# Patient Record
Sex: Female | Born: 1980 | Race: Black or African American | Hispanic: No | Marital: Married | Smoking: Never smoker
Health system: Southern US, Community
[De-identification: ages and names within clinical notes are randomized; demographics above are authoritative.]

## PROBLEM LIST (undated history)

## (undated) DIAGNOSIS — E559 Vitamin D deficiency, unspecified: Secondary | ICD-10-CM

## (undated) DIAGNOSIS — L309 Dermatitis, unspecified: Secondary | ICD-10-CM

## (undated) DIAGNOSIS — R079 Chest pain, unspecified: Secondary | ICD-10-CM

## (undated) DIAGNOSIS — M21611 Bunion of right foot: Secondary | ICD-10-CM

## (undated) DIAGNOSIS — D649 Anemia, unspecified: Secondary | ICD-10-CM

## (undated) DIAGNOSIS — R600 Localized edema: Secondary | ICD-10-CM

## (undated) DIAGNOSIS — I82409 Acute embolism and thrombosis of unspecified deep veins of unspecified lower extremity: Secondary | ICD-10-CM

## (undated) DIAGNOSIS — M549 Dorsalgia, unspecified: Secondary | ICD-10-CM

## (undated) DIAGNOSIS — K5900 Constipation, unspecified: Secondary | ICD-10-CM

## (undated) DIAGNOSIS — M21612 Bunion of left foot: Secondary | ICD-10-CM

## (undated) DIAGNOSIS — R0600 Dyspnea, unspecified: Secondary | ICD-10-CM

## (undated) DIAGNOSIS — K219 Gastro-esophageal reflux disease without esophagitis: Secondary | ICD-10-CM

## (undated) DIAGNOSIS — F419 Anxiety disorder, unspecified: Secondary | ICD-10-CM

## (undated) DIAGNOSIS — E739 Lactose intolerance, unspecified: Secondary | ICD-10-CM

## (undated) DIAGNOSIS — O24419 Gestational diabetes mellitus in pregnancy, unspecified control: Secondary | ICD-10-CM

## (undated) DIAGNOSIS — I10 Essential (primary) hypertension: Secondary | ICD-10-CM

## (undated) HISTORY — DX: Bunion of left foot: M21.612

## (undated) HISTORY — DX: Dorsalgia, unspecified: M54.9

## (undated) HISTORY — DX: Dermatitis, unspecified: L30.9

## (undated) HISTORY — DX: Localized edema: R60.0

## (undated) HISTORY — DX: Gastro-esophageal reflux disease without esophagitis: K21.9

## (undated) HISTORY — DX: Chest pain, unspecified: R07.9

## (undated) HISTORY — DX: Acute embolism and thrombosis of unspecified deep veins of unspecified lower extremity: I82.409

## (undated) HISTORY — DX: Constipation, unspecified: K59.00

## (undated) HISTORY — DX: Vitamin D deficiency, unspecified: E55.9

## (undated) HISTORY — DX: Lactose intolerance, unspecified: E73.9

## (undated) HISTORY — DX: Dyspnea, unspecified: R06.00

## (undated) HISTORY — DX: Bunion of right foot: M21.611

## (undated) HISTORY — DX: Gestational diabetes mellitus in pregnancy, unspecified control: O24.419

## (undated) HISTORY — PX: TONSILLECTOMY: SUR1361

## (undated) HISTORY — DX: Anemia, unspecified: D64.9

## (undated) HISTORY — DX: Essential (primary) hypertension: I10

## (undated) HISTORY — DX: Anxiety disorder, unspecified: F41.9

## (undated) HISTORY — PX: TOTAL ABDOMINAL HYSTERECTOMY: SHX209

## (undated) HISTORY — PX: ROUX-EN-Y PROCEDURE: SUR1287

---

## 2016-03-21 ENCOUNTER — Other Ambulatory Visit: Payer: Self-pay | Admitting: Obstetrics and Gynecology

## 2016-03-21 ENCOUNTER — Other Ambulatory Visit (HOSPITAL_COMMUNITY)
Admission: RE | Admit: 2016-03-21 | Discharge: 2016-03-21 | Disposition: A | Discharge status: Home / Self Care | Payer: 59 | Source: Ambulatory Visit | Attending: Obstetrics and Gynecology | Admitting: Obstetrics and Gynecology

## 2016-03-21 DIAGNOSIS — Z01411 Encounter for gynecological examination (general) (routine) with abnormal findings: Secondary | ICD-10-CM | POA: Diagnosis present

## 2016-03-21 DIAGNOSIS — Z1151 Encounter for screening for human papillomavirus (HPV): Secondary | ICD-10-CM | POA: Diagnosis not present

## 2016-03-27 LAB — CYTOLOGY - PAP: HPV: NOT DETECTED

## 2016-05-02 ENCOUNTER — Other Ambulatory Visit: Payer: Self-pay | Admitting: Obstetrics and Gynecology

## 2017-04-15 ENCOUNTER — Other Ambulatory Visit (HOSPITAL_COMMUNITY)
Admission: RE | Admit: 2017-04-15 | Discharge: 2017-04-15 | Disposition: A | Discharge status: Home / Self Care | Payer: 59 | Source: Ambulatory Visit | Attending: Obstetrics and Gynecology | Admitting: Obstetrics and Gynecology

## 2017-04-15 ENCOUNTER — Other Ambulatory Visit: Payer: Self-pay | Admitting: Obstetrics and Gynecology

## 2017-04-15 DIAGNOSIS — Z01411 Encounter for gynecological examination (general) (routine) with abnormal findings: Secondary | ICD-10-CM | POA: Insufficient documentation

## 2017-04-18 LAB — CYTOLOGY - PAP
Diagnosis: NEGATIVE
HPV: NOT DETECTED

## 2017-07-02 ENCOUNTER — Other Ambulatory Visit: Payer: Self-pay

## 2017-07-02 DIAGNOSIS — M7989 Other specified soft tissue disorders: Secondary | ICD-10-CM

## 2017-08-07 ENCOUNTER — Encounter (HOSPITAL_COMMUNITY): Payer: 59

## 2017-08-07 ENCOUNTER — Encounter

## 2017-08-07 ENCOUNTER — Encounter: Payer: 59 | Admitting: Vascular Surgery

## 2017-08-19 ENCOUNTER — Other Ambulatory Visit: Payer: Self-pay | Admitting: Obstetrics and Gynecology

## 2017-08-19 DIAGNOSIS — N6320 Unspecified lump in the left breast, unspecified quadrant: Secondary | ICD-10-CM

## 2017-08-19 DIAGNOSIS — N644 Mastodynia: Secondary | ICD-10-CM

## 2017-09-02 ENCOUNTER — Other Ambulatory Visit: Payer: Self-pay | Admitting: Obstetrics and Gynecology

## 2017-09-02 DIAGNOSIS — N644 Mastodynia: Secondary | ICD-10-CM

## 2017-09-02 DIAGNOSIS — N6320 Unspecified lump in the left breast, unspecified quadrant: Secondary | ICD-10-CM

## 2017-09-05 ENCOUNTER — Ambulatory Visit
Admission: RE | Admit: 2017-09-05 | Discharge: 2017-09-05 | Disposition: A | Discharge status: Home / Self Care | Payer: 59 | Source: Ambulatory Visit | Attending: Obstetrics and Gynecology | Admitting: Obstetrics and Gynecology

## 2017-09-05 ENCOUNTER — Ambulatory Visit: Payer: 59

## 2017-09-05 DIAGNOSIS — N6320 Unspecified lump in the left breast, unspecified quadrant: Secondary | ICD-10-CM

## 2017-09-05 DIAGNOSIS — N644 Mastodynia: Secondary | ICD-10-CM

## 2017-09-12 ENCOUNTER — Ambulatory Visit (INDEPENDENT_AMBULATORY_CARE_PROVIDER_SITE_OTHER): Payer: 59 | Admitting: Podiatry

## 2017-09-12 ENCOUNTER — Encounter: Payer: Self-pay | Admitting: Podiatry

## 2017-09-12 VITALS — BP 128/84 | HR 86

## 2017-09-12 DIAGNOSIS — L603 Nail dystrophy: Secondary | ICD-10-CM | POA: Diagnosis not present

## 2017-09-12 DIAGNOSIS — L600 Ingrowing nail: Secondary | ICD-10-CM | POA: Diagnosis not present

## 2017-09-12 NOTE — Patient Instructions (Signed)
Soak Instructions    THE DAY AFTER THE PROCEDURE  Place 1/4 cup of epsom salts in a quart of warm tap water.  Submerge your foot or feet with outer bandage intact for the initial soak; this will allow the bandage to become moist and wet for easy lift off.  Once you remove your bandage, continue to soak in the solution for 20 minutes.  This soak should be done twice a day.  Next, remove your foot or feet from solution, blot dry the affected area and cover.  You may use a band aid large enough to cover the area or use gauze and tape.  Apply other medications to the area as directed by the doctor such as polysporin neosporin.  IF YOUR SKIN BECOMES IRRITATED WHILE USING THESE INSTRUCTIONS, IT IS OKAY TO SWITCH TO  WHITE VINEGAR AND WATER. Or you may use antibacterial soap and water to keep the toe clean  Monitor for any signs/symptoms of infection. Call the office immediately if any occur or go directly to the emergency room. Call with any questions/concerns.  Onychomycosis/Fungal Toenails  WHAT IS IT? An infection that lies within the keratin of your nail plate that is caused by a fungus.  WHY ME? Fungal infections affect all ages, sexes, races, and creeds.  There may be many factors that predispose you to a fungal infection such as age, coexisting medical conditions such as diabetes, or an autoimmune disease; stress, medications, fatigue, genetics, etc.  Bottom line: fungus thrives in a warm, moist environment and your shoes offer such a location.  IS IT CONTAGIOUS? Theoretically, yes.  You do not want to share shoes, nail clippers or files with someone who has fungal toenails.  Walking around barefoot in the same room or sleeping in the same bed is unlikely to transfer the organism.  It is important to realize, however, that fungus can spread easily from one nail to the next on the same foot.  HOW DO WE TREAT THIS?  There are several ways to treat this condition.  Treatment may depend on many  factors such as age, medications, pregnancy, liver and kidney conditions, etc.  It is best to ask your doctor which options are available to you.  1. No treatment.   Unlike many other medical concerns, you can live with this condition.  However for many people this can be a painful condition and may lead to ingrown toenails or a bacterial infection.  It is recommended that you keep the nails cut short to help reduce the amount of fungal nail. 2. Topical treatment.  These range from herbal remedies to prescription strength nail lacquers.  About 40-50% effective, topicals require twice daily application for approximately 9 to 12 months or until an entirely new nail has grown out.  The most effective topicals are medical grade medications available through physicians offices. 3. Oral antifungal medications.  With an 80-90% cure rate, the most common oral medication requires 3 to 4 months of therapy and stays in your system for a year as the new nail grows out.  Oral antifungal medications do require blood work to make sure it is a safe drug for you.  A liver function panel will be performed prior to starting the medication and after the first month of treatment.  It is important to have the blood work performed to avoid any harmful side effects.  In general, this medication safe but blood work is required. 4. Laser Therapy.  This treatment is performed by  applying a specialized laser to the affected nail plate.  This therapy is noninvasive, fast, and non-painful.  It is not covered by insurance and is therefore, out of pocket.  The results have been very good with a 80-95% cure rate.  [facility] is the only practice in the area to offer this therapy. 5. Permanent Nail Avulsion.  Removing the entire nail so that a new nail will not grow back.  Ingrown Toenail An ingrown toenail occurs when the corner or sides of your toenail grow into the surrounding skin. The big toe is most commonly affected, but  it can happen to any of your toes. If your ingrown toenail is not treated, you will be at risk for infection. What are the causes? This condition may be caused by:  Wearing shoes that are too small or tight.  Injury or trauma, such as stubbing your toe or having your toe stepped on.  Improper cutting or care of your toenails.  Being born with (congenital) nail or foot abnormalities, such as having a nail that is too big for your toe.  What increases the risk? Risk factors for an ingrown toenail include:  Age. Your nails tend to thicken as you get older, so ingrown nails are more common in older people.  Diabetes.  Cutting your toenails incorrectly.  Blood circulation problems.  What are the signs or symptoms? Symptoms may include:  Pain, soreness, or tenderness.  Redness.  Swelling.  Hardening of the skin surrounding the toe.  Your ingrown toenail may be infected if there is fluid, pus, or drainage. How is this diagnosed? An ingrown toenail may be diagnosed by medical history and physical exam. If your toenail is infected, your health care provider may test a sample of the drainage. How is this treated? Treatment depends on the severity of your ingrown toenail. Some ingrown toenails may be treated at home. More severe or infected ingrown toenails may require surgery to remove all or part of the nail. Infected ingrown toenails may also be treated with antibiotic medicines. Follow these instructions at home:  If you were prescribed an antibiotic medicine, finish all of it even if you start to feel better.  Soak your foot in warm soapy water for 20 minutes, 3 times per day or as directed by your health care provider.  Carefully lift the edge of the nail away from the sore skin by wedging a small piece of cotton under the corner of the nail. This may help with the pain. Be careful not to cause more injury to the area.  Wear shoes that fit well. If your ingrown toenail is  causing you pain, try wearing sandals, if possible.  Trim your toenails regularly and carefully. Do not cut them in a curved shape. Cut your toenails straight across. This prevents injury to the skin at the corners of the toenail.  Keep your feet clean and dry.  If you are having trouble walking and are given crutches by your health care provider, use them as directed.  Do not pick at your toenail or try to remove it yourself.  Take medicines only as directed by your health care provider.  Keep all follow-up visits as directed by your health care provider. This is important. Contact a health care provider if:  Your symptoms do not improve with treatment. Get help right away if:  You have red streaks that start at your foot and go up your leg.  You have a fever.  You have increased redness, swelling, or pain.  You have fluid, blood, or pus coming from your toenail. This information is not intended to replace advice given to you by your health care provider. Make sure you discuss any questions you have with your health care provider. Document Released: 12/30/1999 Document Revised: 06/03/2015 Document Reviewed: 11/25/2013 Elsevier Interactive Patient Education  [employer].

## 2017-09-12 NOTE — Progress Notes (Signed)
Subjective:   Patient ID: Allison Valenzuela, female   DOB: 37 y.o.   MRN: [id number]   HPI 37 year old female presents the office today for concerns of a possible ingrown toenail to the right big toe, pointing the lateral aspect of the tip of the toe.  She states that she had toenail fungus and another doctor took the nail off and she states that it grew back in about the same.  She also completed 3 months of Lamisil without any significant improvement.  She is not sure if the nails grow back normal or what needs to be done to help the nail grow out better.  She is also been getting up with her Nash Mantis on the area which is been helpful as well as soaking Epsom salts.  She has some occasional swelling to the toenail site.  No other concerns.   Review of Systems  All other systems reviewed and are negative.  No past medical history on file.  Past Surgical History:  Procedure Laterality Date  . ROUX-EN-Y PROCEDURE       Current Outpatient Medications:  .  Cholecalciferol (VITAMIN D) 400 UNIT/ML LIQD, Take by mouth., Disp: , Rfl:  .  Cyanocobalamin (VITAMIN B 12) 100 MCG LOZG, Take by mouth., Disp: , Rfl:  .  ELIQUIS 5 MG TABS tablet, Take 5 mg by mouth 2 (two) times daily., Disp: , Rfl: 2 .  IRON PO, Take 65 mg by mouth daily., Disp: , Rfl:  .  NON FORMULARY, [facility]  Onychomycosis Nail Lacquer -  Fluconazole 2%, Terbinafine 1% DMSO/UNDECYLENIC ACID 25% Apply to affected nail once daily Qty. 120 gm 3 refills, Disp: , Rfl:   Allergies  Allergen Reactions  . Dye Fdc Blue 1 [Brilliant Blue Fcf]   . Nickel         Objective:  Physical Exam  General: AAO x3, NAD  Dermatological: The right hallux toenails hypertrophic, dystrophic ill-defined discoloration.  There is incurvation to both corners there is tenderness to the right lateral distal nail corner.  No drainage or pus expressed.  Minimal swelling to the area and there is no erythema or increase in warmth.  No fluctuation or  crepitation or any signs of infection noted today.  Vascular: Dorsalis Pedis artery and Posterior Tibial artery pedal pulses are 2/4 bilateral with immedate capillary fill time. There is no pain with calf compression, swelling, warmth, erythema.   Neruologic: Grossly intact via light touch bilateral. Protective threshold with Semmes Wienstein monofilament intact to all pedal sites bilateral.   Musculoskeletal: No gross boney pedal deformities bilateral. No pain, crepitus, or limitation noted with foot and ankle range of motion bilateral. Muscular strength 5/5 in all groups tested bilateral.  Gait: Unassisted, Nonantalgic.      Assessment:   Ingrown toenail, onychomycosis right hallux     Plan:  -Treatment options discussed including all alternatives, risks, and complications -Etiology of symptoms were discussed -This time we discussed partial nail avulsion we discussed the procedure and postoperative course.  She is hesitant to do this today.  I did sharply debride the toenail with any complications of bleeding to remove the symptomatic portion of ingrown toenail.  After debridement there is no additional pain.Epsom salts soaks as well as whenever soaks to be discussed.  Also discussed topical treatment for nail fungus.  I ordered a compound ointment through [employer].  -Monitor for any signs or symptoms symptoms of infection or if symptoms worsen.  Monitoring signs or symptoms of infection.  Trula Slade DPM

## 2017-09-26 ENCOUNTER — Encounter (INDEPENDENT_AMBULATORY_CARE_PROVIDER_SITE_OTHER): Payer: 59

## 2017-10-08 ENCOUNTER — Encounter (INDEPENDENT_AMBULATORY_CARE_PROVIDER_SITE_OTHER): Payer: Self-pay | Admitting: Family Medicine

## 2017-10-08 ENCOUNTER — Ambulatory Visit (INDEPENDENT_AMBULATORY_CARE_PROVIDER_SITE_OTHER): Payer: 59 | Admitting: Family Medicine

## 2017-10-08 VITALS — BP 116/80 | HR 63 | Temp 97.8°F | Ht 66.0 in | Wt 234.0 lb

## 2017-10-08 DIAGNOSIS — Z6837 Body mass index (BMI) 37.0-37.9, adult: Secondary | ICD-10-CM | POA: Diagnosis not present

## 2017-10-08 DIAGNOSIS — E538 Deficiency of other specified B group vitamins: Secondary | ICD-10-CM

## 2017-10-08 DIAGNOSIS — Z9189 Other specified personal risk factors, not elsewhere classified: Secondary | ICD-10-CM

## 2017-10-08 DIAGNOSIS — Z0289 Encounter for other administrative examinations: Secondary | ICD-10-CM

## 2017-10-08 DIAGNOSIS — Z1331 Encounter for screening for depression: Secondary | ICD-10-CM

## 2017-10-08 DIAGNOSIS — R5383 Other fatigue: Secondary | ICD-10-CM | POA: Diagnosis not present

## 2017-10-08 DIAGNOSIS — R0609 Other forms of dyspnea: Secondary | ICD-10-CM

## 2017-10-08 DIAGNOSIS — R0600 Dyspnea, unspecified: Secondary | ICD-10-CM

## 2017-10-08 DIAGNOSIS — E162 Hypoglycemia, unspecified: Secondary | ICD-10-CM | POA: Diagnosis not present

## 2017-10-08 DIAGNOSIS — E559 Vitamin D deficiency, unspecified: Secondary | ICD-10-CM

## 2017-10-09 ENCOUNTER — Ambulatory Visit (INDEPENDENT_AMBULATORY_CARE_PROVIDER_SITE_OTHER): Payer: 59 | Admitting: Family Medicine

## 2017-10-09 LAB — COMPREHENSIVE METABOLIC PANEL
ALT: 10 IU/L (ref 0–32)
AST: 20 IU/L (ref 0–40)
Albumin/Globulin Ratio: 1.6 (ref 1.2–2.2)
Albumin: 4.6 g/dL (ref 3.5–5.5)
Alkaline Phosphatase: 60 IU/L (ref 39–117)
BUN/Creatinine Ratio: 15 (ref 9–23)
BUN: 13 mg/dL (ref 6–20)
Bilirubin Total: 0.7 mg/dL (ref 0.0–1.2)
CO2: 22 mmol/L (ref 20–29)
Calcium: 9.1 mg/dL (ref 8.7–10.2)
Chloride: 100 mmol/L (ref 96–106)
Creatinine, Ser: 0.84 mg/dL (ref 0.57–1.00)
GFR calc Af Amer: 101 mL/min/{1.73_m2} (ref >59)
GFR calc non Af Amer: 88 mL/min/{1.73_m2} (ref >59)
Globulin, Total: 2.9 g/dL (ref 1.5–4.5)
Glucose: 82 mg/dL (ref 65–99)
Potassium: 4.1 mmol/L (ref 3.5–5.2)
Sodium: 137 mmol/L (ref 134–144)
Total Protein: 7.5 g/dL (ref 6.0–8.5)

## 2017-10-09 LAB — TSH: TSH: 0.789 u[IU]/mL (ref 0.450–4.500)

## 2017-10-09 LAB — CBC WITH DIFFERENTIAL/PLATELET
Basophils Absolute: 0 10*3/uL (ref 0.0–0.2)
Basos: 1 %
EOS (ABSOLUTE): 0.1 10*3/uL (ref 0.0–0.4)
Eos: 3 %
Hematocrit: 36.4 % (ref 34.0–46.6)
Hemoglobin: 11.7 g/dL (ref 11.1–15.9)
Immature Grans (Abs): 0 10*3/uL (ref 0.0–0.1)
Immature Granulocytes: 0 %
Lymphocytes Absolute: 1.6 10*3/uL (ref 0.7–3.1)
Lymphs: 55 %
MCH: 30.1 pg (ref 26.6–33.0)
MCHC: 32.1 g/dL (ref 31.5–35.7)
MCV: 94 fL (ref 79–97)
Monocytes Absolute: 0.2 10*3/uL (ref 0.1–0.9)
Monocytes: 7 %
Neutrophils Absolute: 1 10*3/uL — ABNORMAL LOW (ref 1.4–7.0)
Neutrophils: 34 %
Platelets: 270 10*3/uL (ref 150–450)
RBC: 3.89 x10E6/uL (ref 3.77–5.28)
RDW: 13.1 % (ref 12.3–15.4)
WBC: 3 10*3/uL — ABNORMAL LOW (ref 3.4–10.8)

## 2017-10-09 LAB — LIPID PANEL
Chol/HDL Ratio: 2.5 ratio (ref 0.0–4.4)
Cholesterol, Total: 183 mg/dL (ref 100–199)
HDL: 72 mg/dL (ref >39)
LDL Calculated: 99 mg/dL (ref 0–99)
Triglycerides: 62 mg/dL (ref 0–149)
VLDL Cholesterol Cal: 12 mg/dL (ref 5–40)

## 2017-10-09 LAB — HEMOGLOBIN A1C
Est. average glucose Bld gHb Est-mCnc: 100 mg/dL
Hgb A1c MFr Bld: 5.1 % (ref 4.8–5.6)

## 2017-10-09 LAB — FOLATE: Folate: 14.4 ng/mL (ref >3.0)

## 2017-10-09 LAB — T3: T3, Total: 93 ng/dL (ref 71–180)

## 2017-10-09 LAB — T4, FREE: Free T4: 1.1 ng/dL (ref 0.82–1.77)

## 2017-10-09 LAB — INSULIN, RANDOM: INSULIN: 4.3 u[IU]/mL (ref 2.6–24.9)

## 2017-10-09 LAB — VITAMIN B12: Vitamin B-12: 419 pg/mL (ref 232–1245)

## 2017-10-09 LAB — VITAMIN D 25 HYDROXY (VIT D DEFICIENCY, FRACTURES): Vit D, 25-Hydroxy: 27.6 ng/mL — ABNORMAL LOW (ref 30.0–100.0)

## 2017-10-09 NOTE — Progress Notes (Signed)
Office: [phone]  /  Fax: [phone]   Dear Marda Stalker, PA-C,   Thank you for referring Allison Valenzuela to our clinic. The following note includes my evaluation and treatment recommendations.  HPI:   Chief Complaint: OBESITY    Allison Valenzuela has been referred by Marda Stalker, PA-C for consultation regarding her obesity and obesity related comorbidities.    Allison Valenzuela (MR# [id number]) is a 37 y.o. female who presents on 10/08/2017 for obesity evaluation and treatment. Current BMI is Body mass index is 37.77 kg/m.Marland Kitchen Allison Valenzuela has been struggling with her weight for many years and has been unsuccessful in either losing weight, maintaining weight loss, or reaching her healthy weight goal.     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 She has gone from 300 lbs to 165 lbs in 12 months, she started regaining after 4 years after the birth of her last child.     Allison Valenzuela attended our information session and states she is currently in the action stage of change and ready to dedicate time achieving and maintaining a healthier weight. Allison Valenzuela is interested in becoming our patient and working on intensive lifestyle modifications including (but not limited to) diet, exercise and weight loss.    Allison Valenzuela states her family eats meals together she thinks her family will eat healthier with  her her desired weight loss is 69 lbs she has been heavy most of  her life her heaviest weight ever was 300 lbs she is a picky eater and doesn't like to eat healthier foods  she has significant food cravings issues  she snacks frequently in the evenings she skips meals frequently she is frequently drinking liquids with calories she frequently makes poor food choices she has problems with excessive hunger  she struggles with emotional eating    Fatigue Allison Valenzuela feels her energy is lower than it should be. This has worsened with weight gain and has not worsened recently. Shereece admits to daytime  somnolence and  admits to waking up still tired. Patient is at risk for obstructive sleep apnea. Patent has a history of symptoms of daytime fatigue. Patient generally gets 5 hours of sleep per night, and states they generally have difficulty falling asleep. Snoring is not present. Apneic episodes are not present. Epworth Sleepiness Score is 1.  Dyspnea on exertion [name] notes increasing shortness of breath with exercising and seems to be worsening over time with weight gain. She notes getting out of breath sooner with activity than she used to. This has not gotten worse recently. [name] denies orthopnea.  Hyperglycemia Allison Valenzuela has a history of some elevated blood glucose readings with a history of gestational diabetes mellitus. She has no recent labs. She admits to polyphagia.  Vitamin D Deficiency Allison Valenzuela has a diagnosis of vitamin D deficiency. She is status post weight loss surgery. She is on OTC Vit D, and no recent labs. She notes fatigue and denies nausea, vomiting or muscle weakness.  Vitamin B12 Deficiency Allison Valenzuela has a diagnosis of B12 insufficiency and notes fatigue. She is status post weight loss surgery. She is on OTC Vit B12, and no recent labs. She is not a vegetarian and does not have a previous diagnosis of pernicious anemia.   Positive Depression Screen Allison Valenzuela had a positive depression screening with a PHQ-9 of 19. She notes significant emotional eating and low self-worth. She notes feelings of failure with weight gain after weight loss surgery. She denies suicidal ideations or homicidal ideations.   Depression Screen  Allison Valenzuela's Food and Mood (modified PHQ-9) score was  Depression screen PHQ 2/9 10/08/2017  Decreased Interest 3  Down, Depressed, Hopeless 1  PHQ - 2 Score 4  Altered sleeping 3  Tired, decreased energy 3  Change in appetite 3  Feeling bad or failure about yourself  3  Trouble concentrating 3  Moving slowly or fidgety/restless 0  Suicidal thoughts 0  PHQ-9 Score 19    Difficult doing work/chores Somewhat difficult    ALLERGIES: Allergies  Allergen Reactions  . Dye Fdc Blue 1 [Brilliant Blue Fcf]   . Nickel     MEDICATIONS: Current Outpatient Medications on File Prior to Visit  Medication Sig Dispense Refill  . Ascorbic Acid (VITAMIN C) 1000 MG tablet Take 1,000 mg by mouth daily.    . Cholecalciferol (VITAMIN D) 400 UNIT/ML LIQD Take by mouth.    . Cyanocobalamin (VITAMIN B 12) 100 MCG LOZG Take by mouth.    [name] 5 MG TABS tablet Take 5 mg by mouth 2 (two) times daily.  2  . fexofenadine (ALLEGRA) 180 MG tablet Take 180 mg by mouth daily.    . IRON PO Take 65 mg by mouth daily.    . Probiotic Product (PROBIOTIC-10 PO) Take 1 capsule by mouth daily.    . pseudoephedrine (SUDAFED) 120 MG 12 hr tablet Take 120 mg by mouth daily.    [name] [facility]  Onychomycosis Nail Lacquer -  Fluconazole 2%, Terbinafine 1% DMSO/UNDECYLENIC ACID 25% Apply to affected nail once daily Qty. 120 gm 3 refills     No current facility-administered medications on file prior to visit.     PAST MEDICAL HISTORY: Past Medical History:  Diagnosis Date  . Acid reflux   . Anemia   . Back pain   . Bilateral bunions   . Chest pain   . Constipation   . DVT (deep venous thrombosis) (HCC)    right leg  . Dyspnea   . Gestational diabetes   . HTN (hypertension)   . Lactose intolerance   . Leg edema   . Vitamin D deficiency     PAST SURGICAL HISTORY: Past Surgical History:  Procedure Laterality Date  . ROUX-EN-Y PROCEDURE      SOCIAL HISTORY: Social History   Tobacco Use  . Smoking status: Never Smoker  . Smokeless tobacco: Never Used  Substance Use Topics  . Alcohol use: Yes    Comment: 1-2 a week  . Drug use: Never    FAMILY HISTORY: Family History  Problem Relation Age of Onset  . Hypertension Mother   . Heart disease Mother   . Thyroid disease Mother   . Obesity Mother     ROS: Review of Systems  Constitutional:  Positive for malaise/fatigue. Negative for weight loss.       + Breast lumps + Trouble sleeping   HENT: Positive for sinus pain and tinnitus.        + Nasal stuffiness   Eyes: Positive for blurred vision, double vision and photophobia.       + Vision changes + Wear glasses or contacts  Respiratory: Positive for shortness of breath (with exertion).   Cardiovascular: Positive for palpitations. Negative for orthopnea.       + Chest pain/discomfort + Chest tightness + Difficulty breathing while lying down + Calf/leg pain with walking + Leg cramping + Very cold feet or hands  Gastrointestinal: Positive for constipation, heartburn and nausea.  Musculoskeletal: Positive for back pain.       +  Neck stiffness  Skin: Positive for itching and rash.       + Dryness + Hair or nail changes  Neurological: Positive for headaches.  Endo/Heme/Allergies: Bruises/bleeds easily.       Negative hypoglycemia Positive polyphagia + Heat/cold intolerance  Psychiatric/Behavioral: Positive for depression. Negative for suicidal ideas.    PHYSICAL EXAM: Blood pressure 116/80, pulse 63, temperature 97.8 F (36.6 C), temperature source Oral, height 5\' 6"  (1.676 m), weight 234 lb (106.1 kg), last menstrual period 10/03/2017, SpO2 100 %. Body mass index is 37.77 kg/m. Physical Exam  Constitutional: She is oriented to person, place, and time. She appears well-developed and well-nourished.  HENT:  Head: Normocephalic and atraumatic.  Nose: Nose normal.  Eyes: EOM are normal. No scleral icterus.  Neck: Normal range of motion. Neck supple. No thyromegaly present.  Cardiovascular: Normal rate and regular rhythm.  Pulmonary/Chest: Effort normal. No respiratory distress.  Abdominal: Soft. There is no tenderness.  + Obesity  Musculoskeletal:  Range of Motion normal in all 4 extremities Trace edema noted in bilateral lower extremities  Neurological: She is alert and oriented to person, place, and time.  Coordination normal.  Skin: Skin is warm and dry.  Psychiatric: She has a normal mood and affect. Her behavior is normal.  Vitals reviewed.   RECENT LABS AND TESTS: BMET    Component Value Date/Time   NA 137 10/08/2017 0941   K 4.1 10/08/2017 0941   CL 100 10/08/2017 0941   CO2 22 10/08/2017 0941   GLUCOSE 82 10/08/2017 0941   BUN 13 10/08/2017 0941   CREATININE 0.84 10/08/2017 0941   CALCIUM 9.1 10/08/2017 0941   GFRNONAA 88 10/08/2017 0941   GFRAA 101 10/08/2017 0941   Lab Results  Component Value Date   HGBA1C 5.1 10/08/2017   Lab Results  Component Value Date   INSULIN WILL FOLLOW 10/08/2017   CBC    Component Value Date/Time   WBC 3.0 (L) 10/08/2017 0941   RBC 3.89 10/08/2017 0941   HGB 11.7 10/08/2017 0941   HCT 36.4 10/08/2017 0941   PLT 270 10/08/2017 0941   MCV 94 10/08/2017 0941   MCH 30.1 10/08/2017 0941   MCHC 32.1 10/08/2017 0941   RDW 13.1 10/08/2017 0941   LYMPHSABS 1.6 10/08/2017 0941   EOSABS 0.1 10/08/2017 0941   BASOSABS 0.0 10/08/2017 0941   Iron/TIBC/Ferritin/ %Sat No results found for: IRON, TIBC, FERRITIN, IRONPCTSAT Lipid Panel     Component Value Date/Time   CHOL 183 10/08/2017 0941   TRIG 62 10/08/2017 0941   HDL 72 10/08/2017 0941   CHOLHDL 2.5 10/08/2017 0941   LDLCALC 99 10/08/2017 0941   Hepatic Function Panel     Component Value Date/Time   PROT 7.5 10/08/2017 0941   ALBUMIN 4.6 10/08/2017 0941   AST 20 10/08/2017 0941   ALT 10 10/08/2017 0941   ALKPHOS 60 10/08/2017 0941   BILITOT 0.7 10/08/2017 0941      Component Value Date/Time   TSH 0.789 10/08/2017 0941    ECG  shows NSR with a rate of 69 BPM INDIRECT CALORIMETER done today shows a VO2 of 194 and a REE of 1349.  Her calculated basal metabolic rate is 9326 thus her basal metabolic rate is worse than expected.    ASSESSMENT AND PLAN: Other fatigue - Plan: EKG 12-Lead, CBC with Differential/Platelet, Comprehensive metabolic panel, Lipid panel, Folate, T3,  T4, free, TSH  Dyspnea on exertion  Hypoglycemia - Plan: Hemoglobin A1c, Insulin, random  Vitamin  D deficiency - Plan: VITAMIN D 25 Hydroxy (Vit-D Deficiency, Fractures)  B12 deficiency - Plan: Vitamin B12  Positive depression screening  Screening for depression  At risk for heart disease  Class 2 severe obesity with serious comorbidity and body mass index (BMI) of 37.0 to 37.9 in adult, unspecified obesity type ([place])  PLAN:  Fatigue Joei was informed that her fatigue may be related to obesity, depression or many other causes. Labs will be ordered, and in the meanwhile Ciji has agreed to work on diet, exercise and weight loss to help with fatigue. Proper sleep hygiene was discussed including the need for 7-8 hours of quality sleep each night. A sleep study was not ordered based on symptoms and Epworth score.  Dyspnea on exertion Arlyss's shortness of breath appears to be obesity related and exercise induced. She has agreed to work on weight loss and gradually increase exercise to treat her exercise induced shortness of breath. If Rickey follows our instructions and loses weight without improvement of her shortness of breath, we will plan to refer to pulmonology. We will monitor this condition regularly. Chantell agrees to this plan.  Hyperglycemia Fasting labs will be obtained today and results with be discussed with [name] in 2 weeks at her follow up visit. In the meanwhile Kjirsten was started on a lower simple carbohydrate diet and will work on weight loss efforts.  Vitamin D Deficiency Analisia was informed that low vitamin D levels contributes to fatigue and are associated with obesity, breast, and colon cancer. Reilly agrees to continue taking OTC Vit D and will follow up for routine testing of vitamin D, at least 2-3 times per year. She was informed of the risk of over-replacement of vitamin D and agrees to not increase her dose unless she discusses this with [name] first. We will check labs  and Janautica agrees to follow up with our clinic in 2 weeks.  Vitamin B12 Deficiency Lynessa will continue taking OTC Vit B12 and will work on increasing B12 rich foods in her diet. B12 supplementation was not prescribed today. We will check labs and Manisha agrees to follow up with our clinic in 2 weeks.  Positive Depression Screen Elana had a positive depression screening. We will continue to monitor her mood, and may need to refer to Dr. Mallie Mussel, our bariatric psychologist for evaluation and possible therapy.   Depression Screen Adalind had a strongly positive depression screening. Depression is commonly associated with obesity and often results in emotional eating behaviors. We will monitor this closely and work on CBT to help improve the non-hunger eating patterns. Referral to Psychology may be required if no improvement is seen as she continues in our clinic.  Cardiovascular risk counselling Thedora was given extended (15 minutes) coronary artery disease prevention counseling today. She is 37 y.o. female and has risk factors for heart disease including obesity. We discussed intensive lifestyle modifications today with an emphasis on specific weight loss instructions and strategies. Pt was also informed of the importance of increasing exercise and decreasing saturated fats to help prevent heart disease.  Obesity Mckinzey is currently in the action stage of change and her goal is to continue with weight loss efforts. I recommend Avigail begin the structured treatment plan as follows:  She has agreed to follow the Category 2 plan Lashaya has been instructed to eventually work up to a goal of 150 minutes of combined cardio and strengthening exercise per week for weight loss and overall health benefits. We discussed the following  Behavioral Modification Strategies today: increasing lean protein intake, decreasing simple carbohydrates  and work on meal planning and easy cooking plans   She was informed of the  importance of frequent follow up visits to maximize her success with intensive lifestyle modifications for her multiple health conditions. She was informed we would discuss her lab results at her next visit unless there is a critical issue that needs to be addressed sooner. Ayasha agreed to keep her next visit at the agreed upon time to discuss these results.    OBESITY BEHAVIORAL INTERVENTION VISIT  Today's visit was # 1   Starting weight: 234 lbs Starting date: 10/08/17 Today's weight : 234 lbs Today's date: 10/08/2017 Total lbs lost to date: 0    ASK: We discussed the diagnosis of obesity with Allison Valenzuela today and Glender agreed to give [name] permission to discuss obesity behavioral modification therapy today.  ASSESS: Naesha has the diagnosis of obesity and her BMI today is 37.79 Meleane is in the action stage of change   ADVISE: Makensey was educated on the multiple health risks of obesity as well as the benefit of weight loss to improve her health. She was advised of the need for long term treatment and the importance of lifestyle modifications to improve her current health and to decrease her risk of future health problems.  AGREE: Multiple dietary modification options and treatment options were discussed and  Gloria agreed to follow the recommendations documented in the above note.  ARRANGE: Mathew was educated on the importance of frequent visits to treat obesity as outlined per CMS and USPSTF guidelines and agreed to schedule her next follow up appointment today.  I, Trixie Dredge, am acting as transcriptionist for Dennard Nip, MD  I have reviewed the above documentation for accuracy and completeness, and I agree with the above. -Dennard Nip, MD

## 2017-10-10 ENCOUNTER — Encounter (INDEPENDENT_AMBULATORY_CARE_PROVIDER_SITE_OTHER): Payer: Self-pay | Admitting: Family Medicine

## 2017-10-22 ENCOUNTER — Ambulatory Visit (INDEPENDENT_AMBULATORY_CARE_PROVIDER_SITE_OTHER): Payer: 59 | Admitting: Family Medicine

## 2017-10-22 VITALS — BP 118/78 | HR 81 | Temp 98.1°F | Ht 66.0 in | Wt 234.0 lb

## 2017-10-22 DIAGNOSIS — R632 Polyphagia: Secondary | ICD-10-CM

## 2017-10-22 DIAGNOSIS — Z6837 Body mass index (BMI) 37.0-37.9, adult: Secondary | ICD-10-CM

## 2017-10-22 DIAGNOSIS — E559 Vitamin D deficiency, unspecified: Secondary | ICD-10-CM | POA: Diagnosis not present

## 2017-10-22 DIAGNOSIS — Z9189 Other specified personal risk factors, not elsewhere classified: Secondary | ICD-10-CM | POA: Diagnosis not present

## 2017-10-22 MED ORDER — VITAMIN D (ERGOCALCIFEROL) 1.25 MG (50000 UNIT) PO CAPS: 50000.0000 [IU] | ORAL_CAPSULE | ORAL | 0 refills | Status: DC

## 2017-10-23 NOTE — Progress Notes (Signed)
Office: [phone]  /  Fax: [phone]   HPI:   Chief Complaint: OBESITY Allison Valenzuela is here to discuss her progress with her obesity treatment plan. She is on the Category 2 plan and is following her eating plan approximately 85 % of the time. She states she is walking 30 minutes 2 times per week. Allison Valenzuela struggled to lose weight in the last two weeks, due to excessive hunger. She states, she followed her plan closely and she worked to eat all of her food. Her weight is 234 lb (106.1 kg) today and she has maintained weight over a period of 2 weeks since her last visit. She has lost 0 lbs since starting treatment with [name].  Vitamin D deficiency Allison Valenzuela has a diagnosis of vitamin D deficiency and she is not at goal. She is currently taking OTC vit D on and off and she admits to fatigue. Allison Valenzuela denies nausea, vomiting or muscle weakness.  Polyphagia Allison Valenzuela notes excessive hunger between lunch and dinner. She feels this is "true" hunger and not cravings. Her labs show normal glucose and insulin levels. Her RMR was low, contributing to hunger issues.  At risk for cardiovascular disease Allison Valenzuela is at a higher than average risk for cardiovascular disease due to obesity. She currently denies any chest pain.  ALLERGIES: Allergies  Allergen Reactions  . Dye Fdc Blue 1 [Brilliant Blue Fcf]   . Nickel     MEDICATIONS: Current Outpatient Medications on File Prior to Visit  Medication Sig Dispense Refill  . Ascorbic Acid (VITAMIN C) 1000 MG tablet Take 1,000 mg by mouth daily.    . Cholecalciferol (VITAMIN D) 400 UNIT/ML LIQD Take by mouth.    . Cyanocobalamin (VITAMIN B 12) 100 MCG LOZG Take by mouth.    [name] 5 MG TABS tablet Take 5 mg by mouth 2 (two) times daily.  2  . fexofenadine (ALLEGRA) 180 MG tablet Take 180 mg by mouth daily.    . IRON PO Take 65 mg by mouth daily.    [name] [facility]  Onychomycosis Nail Lacquer -  Fluconazole 2%, Terbinafine 1% DMSO/UNDECYLENIC ACID  25% Apply to affected nail once daily Qty. 120 gm 3 refills    . Probiotic Product (PROBIOTIC-10 PO) Take 1 capsule by mouth daily.    . pseudoephedrine (SUDAFED) 120 MG 12 hr tablet Take 120 mg by mouth daily.     No current facility-administered medications on file prior to visit.     PAST MEDICAL HISTORY: Past Medical History:  Diagnosis Date  . Acid reflux   . Anemia   . Back pain   . Bilateral bunions   . Chest pain   . Constipation   . DVT (deep venous thrombosis) (HCC)    right leg  . Dyspnea   . Gestational diabetes   . HTN (hypertension)   . Lactose intolerance   . Leg edema   . Vitamin D deficiency     PAST SURGICAL HISTORY: Past Surgical History:  Procedure Laterality Date  . ROUX-EN-Y PROCEDURE      SOCIAL HISTORY: Social History   Tobacco Use  . Smoking status: Never Smoker  . Smokeless tobacco: Never Used  Substance Use Topics  . Alcohol use: Yes    Comment: 1-2 a week  . Drug use: Never    FAMILY HISTORY: Family History  Problem Relation Age of Onset  . Hypertension Mother   . Heart disease Mother   . Thyroid disease Mother   .  Obesity Mother     ROS: Review of Systems  Constitutional: Positive for malaise/fatigue. Negative for weight loss.  Gastrointestinal: Negative for nausea and vomiting.  Musculoskeletal:       Negative for muscle weakness  Endo/Heme/Allergies:       Positive for polyphagia Negative for cravings    PHYSICAL EXAM: Blood pressure 118/78, pulse 81, temperature 98.1 F (36.7 C), temperature source Oral, height 5\' 6"  (1.676 m), weight 234 lb (106.1 kg), last menstrual period 10/03/2017, SpO2 100 %. Body mass index is 37.77 kg/m. Physical Exam  Constitutional: She is oriented to person, place, and time. She appears well-developed and well-nourished.  Cardiovascular: Normal rate.  Pulmonary/Chest: Effort normal.  Musculoskeletal: Normal range of motion.  Neurological: She is oriented to person, place, and time.   Skin: Skin is warm and dry.  Psychiatric: She has a normal mood and affect. Her behavior is normal.  Vitals reviewed.   RECENT LABS AND TESTS: BMET    Component Value Date/Time   NA 137 10/08/2017 0941   K 4.1 10/08/2017 0941   CL 100 10/08/2017 0941   CO2 22 10/08/2017 0941   GLUCOSE 82 10/08/2017 0941   BUN 13 10/08/2017 0941   CREATININE 0.84 10/08/2017 0941   CALCIUM 9.1 10/08/2017 0941   GFRNONAA 88 10/08/2017 0941   GFRAA 101 10/08/2017 0941   Lab Results  Component Value Date   HGBA1C 5.1 10/08/2017   Lab Results  Component Value Date   INSULIN 4.3 10/08/2017   CBC    Component Value Date/Time   WBC 3.0 (L) 10/08/2017 0941   RBC 3.89 10/08/2017 0941   HGB 11.7 10/08/2017 0941   HCT 36.4 10/08/2017 0941   PLT 270 10/08/2017 0941   MCV 94 10/08/2017 0941   MCH 30.1 10/08/2017 0941   MCHC 32.1 10/08/2017 0941   RDW 13.1 10/08/2017 0941   LYMPHSABS 1.6 10/08/2017 0941   EOSABS 0.1 10/08/2017 0941   BASOSABS 0.0 10/08/2017 0941   Iron/TIBC/Ferritin/ %Sat No results found for: IRON, TIBC, FERRITIN, IRONPCTSAT Lipid Panel     Component Value Date/Time   CHOL 183 10/08/2017 0941   TRIG 62 10/08/2017 0941   HDL 72 10/08/2017 0941   CHOLHDL 2.5 10/08/2017 0941   LDLCALC 99 10/08/2017 0941   Hepatic Function Panel     Component Value Date/Time   PROT 7.5 10/08/2017 0941   ALBUMIN 4.6 10/08/2017 0941   AST 20 10/08/2017 0941   ALT 10 10/08/2017 0941   ALKPHOS 60 10/08/2017 0941   BILITOT 0.7 10/08/2017 0941      Component Value Date/Time   TSH 0.789 10/08/2017 0941   Results for MYKIRA, HOFMEISTER (MRN [id number]) as of 10/23/2017 12:35  Ref. Range 10/08/2017 09:41  Vitamin D, 25-Hydroxy Latest Ref Range: 30.0 - 100.0 ng/mL 27.6 (L)   ASSESSMENT AND PLAN: Vitamin D deficiency - Plan: Vitamin D, Ergocalciferol, (DRISDOL) 50000 units CAPS capsule  Polyphagia  At risk for heart disease  Class 2 severe obesity with serious comorbidity and body mass  index (BMI) of 37.0 to 37.9 in adult, unspecified obesity type (HCC)  PLAN:  Vitamin D Deficiency Allison Valenzuela was informed that low vitamin D levels contributes to fatigue and are associated with obesity, breast, and colon cancer. She agrees to start prescription Vit D @50 ,000 IU every week #4 with no refills and will follow up for routine testing of vitamin D, at least 2-3 times per year. She was informed of the risk of over-replacement of vitamin  D and agrees to not increase her dose unless she discusses this with [name] first. We will recheck labs in 3 months and Allison Valenzuela will follow up as directed.  Polyphagia Allison Valenzuela is encouraged to add an extra 100 calorie protein snack to help combat polyphagia and we will follow up with two weeks to reassess.  Cardiovascular risk counseling Allison Valenzuela was given extended (30 minutes) coronary artery disease prevention counseling today. She is 37 y.o. female and has risk factors for heart disease including obesity. We discussed intensive lifestyle modifications today with an emphasis on specific weight loss instructions and strategies. Pt was also informed of the importance of increasing exercise and decreasing saturated fats to help prevent heart disease.  Obesity Allison Valenzuela is currently in the action stage of change. As such, her goal is to continue with weight loss efforts She has agreed to follow the Category 2 plan +100 calories Allison Valenzuela has been instructed to work up to a goal of 150 minutes of combined cardio and strengthening exercise per week for weight loss and overall health benefits. We discussed the following Behavioral Modification Strategies today: better snacking choices, increasing lean protein intake, decreasing simple carbohydrates  and work on meal planning and easy cooking plans  Allison Valenzuela has agreed to follow up with our clinic in 2 weeks. She was informed of the importance of frequent follow up visits to maximize her success with intensive lifestyle modifications  for her multiple health conditions.   OBESITY BEHAVIORAL INTERVENTION VISIT  Today's visit was # 2   Starting weight: 234 lbs Starting date: 10/08/17 Today's weight : 234 lbs Today's date: 10/22/2017 Total lbs lost to date: 0   ASK: We discussed the diagnosis of obesity with Allison Valenzuela today and Allison Valenzuela agreed to give [name] permission to discuss obesity behavioral modification therapy today.  ASSESS: Allison Valenzuela has the diagnosis of obesity and her BMI today is 37.79 Allison Valenzuela is in the action stage of change   ADVISE: Allison Valenzuela was educated on the multiple health risks of obesity as well as the benefit of weight loss to improve her health. She was advised of the need for long term treatment and the importance of lifestyle modifications to improve her current health and to decrease her risk of future health problems.  AGREE: Multiple dietary modification options and treatment options were discussed and  Allison Valenzuela agreed to follow the recommendations documented in the above note.  ARRANGE: Allison Valenzuela was educated on the importance of frequent visits to treat obesity as outlined per CMS and USPSTF guidelines and agreed to schedule her next follow up appointment today.  I, Doreene Nest, am acting as transcriptionist for Dennard Nip, MD  I have reviewed the above documentation for accuracy and completeness, and I agree with the above. -Dennard Nip, MD

## 2017-11-05 ENCOUNTER — Ambulatory Visit (INDEPENDENT_AMBULATORY_CARE_PROVIDER_SITE_OTHER): Payer: 59 | Admitting: Family Medicine

## 2017-11-05 VITALS — BP 124/84 | HR 68 | Temp 98.3°F | Ht 66.0 in | Wt 233.0 lb

## 2017-11-05 DIAGNOSIS — Z6837 Body mass index (BMI) 37.0-37.9, adult: Secondary | ICD-10-CM

## 2017-11-05 DIAGNOSIS — Z9189 Other specified personal risk factors, not elsewhere classified: Secondary | ICD-10-CM | POA: Diagnosis not present

## 2017-11-05 DIAGNOSIS — E559 Vitamin D deficiency, unspecified: Secondary | ICD-10-CM | POA: Diagnosis not present

## 2017-11-05 DIAGNOSIS — G4709 Other insomnia: Secondary | ICD-10-CM | POA: Diagnosis not present

## 2017-11-05 MED ORDER — VITAMIN D (ERGOCALCIFEROL) 1.25 MG (50000 UNIT) PO CAPS: 50000.0000 [IU] | ORAL_CAPSULE | ORAL | 0 refills | Status: DC

## 2017-11-07 NOTE — Progress Notes (Signed)
Office: [phone]  /  Fax: [phone]   HPI:   Chief Complaint: OBESITY Allison Valenzuela is here to discuss her progress with her obesity treatment plan. She is on the  follow the Category 2 plan and is following her eating plan approximately 80 % of the time. She states she is exercising 0 minutes 0 times per week. Allison Valenzuela did well with weight loss but is still struggling to eat all her food. She is still struggling at dinner when she is especially tired and she struggles to meal prep.   Her weight is 233 lb (105.7 kg) today and has had a weight loss of 1 pounds over a period of 2 weeks since her last visit. She has lost 1 lb since starting treatment with [name].  Vitamin D deficiency Allison Valenzuela has a diagnosis of vitamin D deficiency. She is currently stable on prescription Vit D but she is not yet at goal. Allison Valenzuela still notes fatigue but denies nausea, vomiting or muscle weakness.  Insomnia Allison Valenzuela complains of insomnia. She states she is falling asleep fine but wakes up after 3-4 hours and has trouble falling asleep again. She started Melatonin 10 mg qhs but is only taking PRN.   At risk for cardiovascular disease Allison Valenzuela is at a higher than average risk for cardiovascular disease due to obesity. She currently denies any chest pain.  ALLERGIES: Allergies  Allergen Reactions  . Dye Fdc Blue 1 [Brilliant Blue Fcf]   . Nickel     MEDICATIONS: Current Outpatient Medications on File Prior to Visit  Medication Sig Dispense Refill  . Ascorbic Acid (VITAMIN C) 1000 MG tablet Take 1,000 mg by mouth daily.    . Cholecalciferol (VITAMIN D) 400 UNIT/ML LIQD Take by mouth.    . Cyanocobalamin (VITAMIN B 12) 100 MCG LOZG Take by mouth.    Allison Valenzuela 5 MG TABS tablet Take 5 mg by mouth 2 (two) times daily.  2  . fexofenadine (ALLEGRA) 180 MG tablet Take 180 mg by mouth daily.    . IRON PO Take 65 mg by mouth daily.    Allison Valenzuela Allison Valenzuela  Onychomycosis Nail Lacquer -  Fluconazole 2%, Terbinafine  1% DMSO/UNDECYLENIC ACID 25% Apply to affected nail once daily Qty. 120 gm 3 refills    . Probiotic Product (PROBIOTIC-10 PO) Take 1 capsule by mouth daily.    . pseudoephedrine (SUDAFED) 120 MG 12 hr tablet Take 120 mg by mouth daily.     No current facility-administered medications on file prior to visit.     PAST MEDICAL HISTORY: Past Medical History:  Diagnosis Date  . Acid reflux   . Anemia   . Back pain   . Bilateral bunions   . Chest pain   . Constipation   . DVT (deep venous thrombosis) (HCC)    right leg  . Dyspnea   . Gestational diabetes   . HTN (hypertension)   . Lactose intolerance   . Leg edema   . Vitamin D deficiency     PAST SURGICAL HISTORY: Past Surgical History:  Procedure Laterality Date  . ROUX-EN-Y PROCEDURE      SOCIAL HISTORY: Social History   Tobacco Use  . Smoking status: Never Smoker  . Smokeless tobacco: Never Used  Substance Use Topics  . Alcohol use: Yes    Comment: 1-2 a week  . Drug use: Never    FAMILY HISTORY: Family History  Problem Relation Age of Onset  . Hypertension Mother   .  Heart disease Mother   . Thyroid disease Mother   . Obesity Mother     ROS: Review of Systems  Constitutional: Positive for malaise/fatigue and weight loss.  Cardiovascular: Negative for chest pain.  Gastrointestinal: Negative for nausea and vomiting.  Musculoskeletal:       Negative muscle weakness  Psychiatric/Behavioral: The patient has insomnia.     PHYSICAL EXAM: Blood pressure 124/84, pulse 68, temperature 98.3 F (36.8 C), temperature source Oral, height 5\' 6"  (1.676 m), weight 233 lb (105.7 kg), last menstrual period 10/31/2017, SpO2 99 %. Body mass index is 37.61 kg/m. Physical Exam  Constitutional: She is oriented to person, place, and time. She appears well-developed and well-nourished.  Cardiovascular: Normal rate.  Pulmonary/Chest: Effort normal.  Musculoskeletal: Normal range of motion.  Neurological: She is alert  and oriented to person, place, and time.  Skin: Skin is warm and dry.  Psychiatric: She has a normal mood and affect. Her behavior is normal.  Vitals reviewed.   RECENT LABS AND TESTS: BMET    Component Value Date/Time   NA 137 10/08/2017 0941   K 4.1 10/08/2017 0941   CL 100 10/08/2017 0941   CO2 22 10/08/2017 0941   GLUCOSE 82 10/08/2017 0941   BUN 13 10/08/2017 0941   CREATININE 0.84 10/08/2017 0941   CALCIUM 9.1 10/08/2017 0941   GFRNONAA 88 10/08/2017 0941   GFRAA 101 10/08/2017 0941   Lab Results  Component Value Date   HGBA1C 5.1 10/08/2017   Lab Results  Component Value Date   INSULIN 4.3 10/08/2017   CBC    Component Value Date/Time   WBC 3.0 (L) 10/08/2017 0941   RBC 3.89 10/08/2017 0941   HGB 11.7 10/08/2017 0941   HCT 36.4 10/08/2017 0941   PLT 270 10/08/2017 0941   MCV 94 10/08/2017 0941   MCH 30.1 10/08/2017 0941   MCHC 32.1 10/08/2017 0941   RDW 13.1 10/08/2017 0941   LYMPHSABS 1.6 10/08/2017 0941   EOSABS 0.1 10/08/2017 0941   BASOSABS 0.0 10/08/2017 0941   Iron/TIBC/Ferritin/ %Sat No results found for: IRON, TIBC, FERRITIN, IRONPCTSAT Lipid Panel     Component Value Date/Time   CHOL 183 10/08/2017 0941   TRIG 62 10/08/2017 0941   HDL 72 10/08/2017 0941   CHOLHDL 2.5 10/08/2017 0941   LDLCALC 99 10/08/2017 0941   Hepatic Function Panel     Component Value Date/Time   PROT 7.5 10/08/2017 0941   ALBUMIN 4.6 10/08/2017 0941   AST 20 10/08/2017 0941   ALT 10 10/08/2017 0941   ALKPHOS 60 10/08/2017 0941   BILITOT 0.7 10/08/2017 0941      Component Value Date/Time   TSH 0.789 10/08/2017 0941  Results for Allison Valenzuela, Allison Valenzuela (MRN [id number]) as of 11/07/2017 14:03  Ref. Range 10/08/2017 09:41  Vitamin D, 25-Hydroxy Latest Ref Range: 30.0 - 100.0 ng/mL 27.6 (L)    ASSESSMENT AND PLAN: Vitamin D deficiency - Plan: Vitamin D, Ergocalciferol, (DRISDOL) 50000 units CAPS capsule  Other insomnia  At risk for heart disease  Class 2 severe  obesity with serious comorbidity and body mass index (BMI) of 37.0 to 37.9 in adult, unspecified obesity type (HCC)  PLAN: Vitamin D Deficiency Allison Valenzuela was informed that low vitamin D levels contributes to fatigue and are associated with obesity, breast, and colon cancer. She agrees to continue taking prescription Vit D @50 ,000 IU every week #4 with no refills. Allison Valenzuela will follow up for routine testing of vitamin D, at least 2-3 times  per year. She was informed of the risk of over-replacement of vitamin D and agrees to not increase her dose unless she discusses this with [name] first. Allison Valenzuela agrees to follow up with our office in 2 weeks.   Insomnia The problem of recurrent insomnia was discussed. Allison Valenzuela was encouraged to take Melatonin nightly. We discussed other sleep hygiene strategies to help her stay asleep longer.  Cardiovascular risk counselling Allison Valenzuela was given extended (15 minutes) coronary artery disease prevention counseling today. She is 37 y.o. female and has risk factors for heart disease including obesity. We discussed intensive lifestyle modifications today with an emphasis on specific weight loss instructions and strategies. Pt was also informed of the importance of increasing exercise and decreasing saturated fats to help prevent heart disease.  Obesity Allison Valenzuela is currently in the action stage of change. As such, her goal is to continue with weight loss efforts She has agreed to follow the Category 2 plan Allison Valenzuela has been instructed to work up to a goal of 150 minutes of combined cardio and strengthening exercise per week for weight loss and overall health benefits. We discussed the following Behavioral Modification Stratagies today: work on meal planning and easy cooking plans and ways to avoid night time snacking, better snack choices and planning for success.    Allison Valenzuela has agreed to follow up with our clinic in 2 weeks. She was informed of the importance of frequent follow up visits to  maximize her success with intensive lifestyle modifications for her multiple health conditions.   OBESITY BEHAVIORAL INTERVENTION VISIT  Today's visit was # 3   Starting weight: 234 lbs Starting date: 10/08/2017  Today's weight: 233 lbs Today's date: 11/05/2017 Total lbs lost to date: 1    ASK: We discussed the diagnosis of obesity with Allison Valenzuela today and Allison Valenzuela agreed to give [name] permission to discuss obesity behavioral modification therapy today.  ASSESS: Allison Valenzuela has the diagnosis of obesity and her BMI today is 37.63 Allison Valenzuela is in the action stage of change   ADVISE: Allison Valenzuela was educated on the multiple health risks of obesity as well as the benefit of weight loss to improve her health. She was advised of the need for long term treatment and the importance of lifestyle modifications to improve her current health and to decrease her risk of future health problems.  AGREE: Multiple dietary modification options and treatment options were discussed and  Latrish agreed to follow the recommendations documented in the above note.  ARRANGE: Adell was educated on the importance of frequent visits to treat obesity as outlined per CMS and USPSTF guidelines and agreed to schedule her next follow up appointment today.  I, Remi Deter, CMA, am acting as transcriptionist for Dennard Nip, MD  I have reviewed the above documentation for accuracy and completeness, and I agree with the above. -Dennard Nip, MD

## 2017-11-12 ENCOUNTER — Other Ambulatory Visit (INDEPENDENT_AMBULATORY_CARE_PROVIDER_SITE_OTHER): Payer: Self-pay | Admitting: Family Medicine

## 2017-11-12 ENCOUNTER — Encounter (INDEPENDENT_AMBULATORY_CARE_PROVIDER_SITE_OTHER): Payer: Self-pay | Admitting: Family Medicine

## 2017-11-12 DIAGNOSIS — E559 Vitamin D deficiency, unspecified: Secondary | ICD-10-CM

## 2017-11-13 ENCOUNTER — Encounter (INDEPENDENT_AMBULATORY_CARE_PROVIDER_SITE_OTHER): Payer: Self-pay | Admitting: Family Medicine

## 2017-11-27 ENCOUNTER — Ambulatory Visit (INDEPENDENT_AMBULATORY_CARE_PROVIDER_SITE_OTHER): Payer: 59 | Admitting: Family Medicine

## 2017-11-27 VITALS — BP 123/84 | HR 69 | Temp 98.3°F | Ht 66.0 in | Wt 232.0 lb

## 2017-11-27 DIAGNOSIS — E559 Vitamin D deficiency, unspecified: Secondary | ICD-10-CM | POA: Diagnosis not present

## 2017-11-27 DIAGNOSIS — Z9189 Other specified personal risk factors, not elsewhere classified: Secondary | ICD-10-CM

## 2017-11-27 DIAGNOSIS — Z6837 Body mass index (BMI) 37.0-37.9, adult: Secondary | ICD-10-CM | POA: Diagnosis not present

## 2017-11-27 MED ORDER — VITAMIN D (ERGOCALCIFEROL) 1.25 MG (50000 UNIT) PO CAPS: 50000.0000 [IU] | ORAL_CAPSULE | ORAL | 0 refills | Status: DC

## 2017-11-28 ENCOUNTER — Encounter (INDEPENDENT_AMBULATORY_CARE_PROVIDER_SITE_OTHER): Payer: Self-pay | Admitting: Family Medicine

## 2017-11-28 NOTE — Progress Notes (Signed)
Office: [phone]  /  Fax: [phone]   HPI:   Chief Complaint: OBESITY Allison Valenzuela is here to discuss her progress with her obesity treatment plan. She is on the Category 2 plan and is following her eating plan approximately 80 % of the time. She states she is exercising 0 minutes 0 times per week. Allison Valenzuela is doing well with Category 2 plan for breakfast and lunch, but she isn't following the Category 2 dinner plan well.  Her weight is 232 lb (105.2 kg) today and has had a weight loss of 1 pound over a period of 3 weeks since her last visit. She has lost 2 lbs since starting treatment with [name].  Vitamin D Deficiency Allison Valenzuela has a diagnosis of vitamin D deficiency. She is stable on prescription Vit D, but level is not yet at goal. She denies nausea, vomiting or muscle weakness.  At risk for diabetes Allison Valenzuela is at higher than average risk for developing diabetes due to her obesity. She currently denies polyuria or polydipsia.  ALLERGIES: Allergies  Allergen Reactions  . Dye Fdc Blue 1 [Brilliant Blue Fcf]   . Nickel     MEDICATIONS: Current Outpatient Medications on File Prior to Visit  Medication Sig Dispense Refill  . Ascorbic Acid (VITAMIN C) 1000 MG tablet Take 1,000 mg by mouth daily.    . Cholecalciferol (VITAMIN D) 400 UNIT/ML LIQD Take by mouth.    . Cyanocobalamin (VITAMIN B 12) 100 MCG LOZG Take by mouth.    [name] 5 MG TABS tablet Take 5 mg by mouth 2 (two) times daily.  2  . fexofenadine (ALLEGRA) 180 MG tablet Take 180 mg by mouth daily.    . IRON PO Take 65 mg by mouth daily.    [name] [facility]  Onychomycosis Nail Lacquer -  Fluconazole 2%, Terbinafine 1% DMSO/UNDECYLENIC ACID 25% Apply to affected nail once daily Qty. 120 gm 3 refills    . Probiotic Product (PROBIOTIC-10 PO) Take 1 capsule by mouth daily.    . pseudoephedrine (SUDAFED) 120 MG 12 hr tablet Take 120 mg by mouth daily.     No current facility-administered medications on file prior  to visit.     PAST MEDICAL HISTORY: Past Medical History:  Diagnosis Date  . Acid reflux   . Anemia   . Back pain   . Bilateral bunions   . Chest pain   . Constipation   . DVT (deep venous thrombosis) (HCC)    right leg  . Dyspnea   . Gestational diabetes   . HTN (hypertension)   . Lactose intolerance   . Leg edema   . Vitamin D deficiency     PAST SURGICAL HISTORY: Past Surgical History:  Procedure Laterality Date  . ROUX-EN-Y PROCEDURE      SOCIAL HISTORY: Social History   Tobacco Use  . Smoking status: Never Smoker  . Smokeless tobacco: Never Used  Substance Use Topics  . Alcohol use: Yes    Comment: 1-2 a week  . Drug use: Never    FAMILY HISTORY: Family History  Problem Relation Age of Onset  . Hypertension Mother   . Heart disease Mother   . Thyroid disease Mother   . Obesity Mother     ROS: Review of Systems  Constitutional: Positive for weight loss.  Gastrointestinal: Negative for nausea and vomiting.  Genitourinary: Negative for frequency.  Musculoskeletal:       Negative muscle weakness  Endo/Heme/Allergies: Negative for polydipsia.  PHYSICAL EXAM: Blood pressure 123/84, pulse 69, temperature 98.3 F (36.8 C), temperature source Oral, height 5\' 6"  (1.676 m), weight 232 lb (105.2 kg), last menstrual period 11/26/2017, SpO2 100 %. Body mass index is 37.45 kg/m. Physical Exam  Constitutional: She is oriented to person, place, and time. She appears well-developed and well-nourished.  Cardiovascular: Normal rate.  Pulmonary/Chest: Effort normal.  Musculoskeletal: Normal range of motion.  Neurological: She is oriented to person, place, and time.  Skin: Skin is warm and dry.  Psychiatric: She has a normal mood and affect. Her behavior is normal.  Vitals reviewed.   RECENT LABS AND TESTS: BMET    Component Value Date/Time   NA 137 10/08/2017 0941   K 4.1 10/08/2017 0941   CL 100 10/08/2017 0941   CO2 22 10/08/2017 0941   GLUCOSE  82 10/08/2017 0941   BUN 13 10/08/2017 0941   CREATININE 0.84 10/08/2017 0941   CALCIUM 9.1 10/08/2017 0941   GFRNONAA 88 10/08/2017 0941   GFRAA 101 10/08/2017 0941   Lab Results  Component Value Date   HGBA1C 5.1 10/08/2017   Lab Results  Component Value Date   INSULIN 4.3 10/08/2017   CBC    Component Value Date/Time   WBC 3.0 (L) 10/08/2017 0941   RBC 3.89 10/08/2017 0941   HGB 11.7 10/08/2017 0941   HCT 36.4 10/08/2017 0941   PLT 270 10/08/2017 0941   MCV 94 10/08/2017 0941   MCH 30.1 10/08/2017 0941   MCHC 32.1 10/08/2017 0941   RDW 13.1 10/08/2017 0941   LYMPHSABS 1.6 10/08/2017 0941   EOSABS 0.1 10/08/2017 0941   BASOSABS 0.0 10/08/2017 0941   Iron/TIBC/Ferritin/ %Sat No results found for: IRON, TIBC, FERRITIN, IRONPCTSAT Lipid Panel     Component Value Date/Time   CHOL 183 10/08/2017 0941   TRIG 62 10/08/2017 0941   HDL 72 10/08/2017 0941   CHOLHDL 2.5 10/08/2017 0941   LDLCALC 99 10/08/2017 0941   Hepatic Function Panel     Component Value Date/Time   PROT 7.5 10/08/2017 0941   ALBUMIN 4.6 10/08/2017 0941   AST 20 10/08/2017 0941   ALT 10 10/08/2017 0941   ALKPHOS 60 10/08/2017 0941   BILITOT 0.7 10/08/2017 0941      Component Value Date/Time   TSH 0.789 10/08/2017 0941   Results for KYNLI, CHOU (MRN [id number]) as of 11/28/2017 15:53  Ref. Range 10/08/2017 09:41  Vitamin D, 25-Hydroxy Latest Ref Range: 30.0 - 100.0 ng/mL 27.6 (L)   ASSESSMENT AND PLAN: Vitamin D deficiency - Plan: Vitamin D, Ergocalciferol, (DRISDOL) 1.25 MG (50000 UT) CAPS capsule  At risk for diabetes mellitus  Class 2 severe obesity with serious comorbidity and body mass index (BMI) of 37.0 to 37.9 in adult, unspecified obesity type ([place])  PLAN:  Vitamin D Deficiency Allison Valenzuela was informed that low vitamin D levels contributes to fatigue and are associated with obesity, breast, and colon cancer. Allison Valenzuela agrees to continue taking prescription Vit D @50 ,000 IU every week  #4 and we will refill for 1 month. She will follow up for routine testing of vitamin D, at least 2-3 times per year. She was informed of the risk of over-replacement of vitamin D and agrees to not increase her dose unless she discusses this with [name] first. Allison Valenzuela agrees to follow up with our clinic in 3 weeks.  Diabetes risk counselling Allison Valenzuela was given extended (15 minutes) diabetes prevention counseling today. She is 37 y.o. female and has risk factors for  diabetes including obesity. We discussed intensive lifestyle modifications today with an emphasis on weight loss as well as increasing exercise and decreasing simple carbohydrates in her diet.  Obesity Allison Valenzuela is currently in the action stage of change. As such, her goal is to continue with weight loss efforts She has agreed to follow the Category 2 plan Allison Valenzuela has been instructed to work up to a goal of 150 minutes of combined cardio and strengthening exercise per week for weight loss and overall health benefits. We discussed the following Behavioral Modification Strategies today: increasing lean protein intake, decreasing simple carbohydrates, work on meal planning and easy cooking plans and holiday eating strategies  Dinner strategies were discussed.  Allison Valenzuela has agreed to follow up with our clinic in 3 weeks. She was informed of the importance of frequent follow up visits to maximize her success with intensive lifestyle modifications for her multiple health conditions.   OBESITY BEHAVIORAL INTERVENTION VISIT  Today's visit was # 4   Starting weight: 234 lbs Starting date: 10/08/17 Today's weight : 232 lbs Today's date: 11/27/2017 Total lbs lost to date: 2    ASK: We discussed the diagnosis of obesity with Allison Valenzuela today and Allison Valenzuela agreed to give [name] permission to discuss obesity behavioral modification therapy today.  ASSESS: Allison Valenzuela has the diagnosis of obesity and her BMI today is 37.46 Allison Valenzuela is in the action stage of change    ADVISE: Allison Valenzuela was educated on the multiple health risks of obesity as well as the benefit of weight loss to improve her health. She was advised of the need for long term treatment and the importance of lifestyle modifications to improve her current health and to decrease her risk of future health problems.  AGREE: Multiple dietary modification options and treatment options were discussed and  Allison Valenzuela agreed to follow the recommendations documented in the above note.  ARRANGE: Allison Valenzuela was educated on the importance of frequent visits to treat obesity as outlined per CMS and USPSTF guidelines and agreed to schedule her next follow up appointment today.  I, Trixie Dredge, am acting as transcriptionist for Dennard Nip, MD  I have reviewed the above documentation for accuracy and completeness, and I agree with the above. -Dennard Nip, MD

## 2017-12-04 ENCOUNTER — Encounter (INDEPENDENT_AMBULATORY_CARE_PROVIDER_SITE_OTHER): Payer: Self-pay | Admitting: Family Medicine

## 2017-12-11 DIAGNOSIS — Z7901 Long term (current) use of anticoagulants: Secondary | ICD-10-CM | POA: Insufficient documentation

## 2017-12-11 DIAGNOSIS — Z9884 Bariatric surgery status: Secondary | ICD-10-CM | POA: Insufficient documentation

## 2017-12-13 ENCOUNTER — Encounter (INDEPENDENT_AMBULATORY_CARE_PROVIDER_SITE_OTHER): Payer: Self-pay | Admitting: Physician Assistant

## 2017-12-18 ENCOUNTER — Encounter (INDEPENDENT_AMBULATORY_CARE_PROVIDER_SITE_OTHER): Payer: Self-pay

## 2017-12-18 ENCOUNTER — Ambulatory Visit (INDEPENDENT_AMBULATORY_CARE_PROVIDER_SITE_OTHER): Payer: 59 | Admitting: Bariatrics

## 2017-12-19 ENCOUNTER — Ambulatory Visit (INDEPENDENT_AMBULATORY_CARE_PROVIDER_SITE_OTHER): Payer: 59 | Admitting: Family Medicine

## 2017-12-19 ENCOUNTER — Encounter (INDEPENDENT_AMBULATORY_CARE_PROVIDER_SITE_OTHER): Payer: Self-pay | Admitting: Family Medicine

## 2017-12-19 VITALS — BP 109/75 | HR 70 | Temp 98.0°F | Ht 66.0 in | Wt 233.0 lb

## 2017-12-19 DIAGNOSIS — Z6837 Body mass index (BMI) 37.0-37.9, adult: Secondary | ICD-10-CM | POA: Diagnosis not present

## 2017-12-19 DIAGNOSIS — R5383 Other fatigue: Secondary | ICD-10-CM

## 2017-12-23 NOTE — Progress Notes (Signed)
Office: [phone]  /  Fax: [phone]   HPI:   Chief Complaint: OBESITY Allison Valenzuela is here to discuss her progress with her obesity treatment plan. She is on the Category 2 plan and is following her eating plan approximately 50 % of the time. She states she is walking 40 minutes 6 times per week. [name] continues to struggle with diet and meal planning an prepping with minimal support at home. She would like to look at other meal options.  Her weight is 233 lb (105.7 kg) today and has had a weight gain of 1 pound over a period of 3 weeks since her last visit. She has lost 1 lb since starting treatment with [name].  Fatigue [name] feels her energy is lower than it should be. This has worsened in the last month with reduced sleep and increased work stress. [name]  Is feeling too tired to cook. Her labs were within normal limits.  ALLERGIES: Allergies  Allergen Reactions  . Dye Fdc Blue 1 [Brilliant Blue Fcf]   . Nickel     MEDICATIONS: Current Outpatient Medications on File Prior to Visit  Medication Sig Dispense Refill  . Ascorbic Acid (VITAMIN C) 1000 MG tablet Take 1,000 mg by mouth daily.    . Cholecalciferol (VITAMIN D) 400 UNIT/ML LIQD Take by mouth.    . Cyanocobalamin (VITAMIN B 12) 100 MCG LOZG Take by mouth.    [name] 5 MG TABS tablet Take 5 mg by mouth 2 (two) times daily.  2  . fexofenadine (ALLEGRA) 180 MG tablet Take 180 mg by mouth daily.    . IRON PO Take 65 mg by mouth daily.    [name] [facility]  Onychomycosis Nail Lacquer -  Fluconazole 2%, Terbinafine 1% DMSO/UNDECYLENIC ACID 25% Apply to affected nail once daily Qty. 120 gm 3 refills    . Probiotic Product (PROBIOTIC-10 PO) Take 1 capsule by mouth daily.    . pseudoephedrine (SUDAFED) 120 MG 12 hr tablet Take 120 mg by mouth daily.    . Vitamin D, Ergocalciferol, (DRISDOL) 1.25 MG (50000 UT) CAPS capsule Take 1 capsule (50,000 Units total) by mouth every 7 (seven) days. 4 capsule 0   No  current facility-administered medications on file prior to visit.     PAST MEDICAL HISTORY: Past Medical History:  Diagnosis Date  . Acid reflux   . Anemia   . Back pain   . Bilateral bunions   . Chest pain   . Constipation   . DVT (deep venous thrombosis) (HCC)    right leg  . Dyspnea   . Gestational diabetes   . HTN (hypertension)   . Lactose intolerance   . Leg edema   . Vitamin D deficiency     PAST SURGICAL HISTORY: Past Surgical History:  Procedure Laterality Date  . ROUX-EN-Y PROCEDURE      SOCIAL HISTORY: Social History   Tobacco Use  . Smoking status: Never Smoker  . Smokeless tobacco: Never Used  Substance Use Topics  . Alcohol use: Yes    Comment: 1-2 a week  . Drug use: Never    FAMILY HISTORY: Family History  Problem Relation Age of Onset  . Hypertension Mother   . Heart disease Mother   . Thyroid disease Mother   . Obesity Mother     ROS: Review of Systems  Constitutional: Positive for malaise/fatigue. Negative for weight loss.    PHYSICAL EXAM: Blood pressure 109/75, pulse 70, temperature 98 F (36.7  C), temperature source Oral, height 5\' 6"  (1.676 m), weight 233 lb (105.7 kg), last menstrual period 11/26/2017, SpO2 100 %. Body mass index is 37.61 kg/m. Physical Exam  Constitutional: She is oriented to person, place, and time. She appears well-developed and well-nourished.  Cardiovascular: Normal rate.  Pulmonary/Chest: Effort normal.  Musculoskeletal: Normal range of motion.  Neurological: She is oriented to person, place, and time.  Skin: Skin is warm and dry.  Psychiatric: She has a normal mood and affect. Her behavior is normal.  Vitals reviewed.   RECENT LABS AND TESTS: BMET    Component Value Date/Time   NA 137 10/08/2017 0941   K 4.1 10/08/2017 0941   CL 100 10/08/2017 0941   CO2 22 10/08/2017 0941   GLUCOSE 82 10/08/2017 0941   BUN 13 10/08/2017 0941   CREATININE 0.84 10/08/2017 0941   CALCIUM 9.1 10/08/2017 0941     GFRNONAA 88 10/08/2017 0941   GFRAA 101 10/08/2017 0941   Lab Results  Component Value Date   HGBA1C 5.1 10/08/2017   Lab Results  Component Value Date   INSULIN 4.3 10/08/2017   CBC    Component Value Date/Time   WBC 3.0 (L) 10/08/2017 0941   RBC 3.89 10/08/2017 0941   HGB 11.7 10/08/2017 0941   HCT 36.4 10/08/2017 0941   PLT 270 10/08/2017 0941   MCV 94 10/08/2017 0941   MCH 30.1 10/08/2017 0941   MCHC 32.1 10/08/2017 0941   RDW 13.1 10/08/2017 0941   LYMPHSABS 1.6 10/08/2017 0941   EOSABS 0.1 10/08/2017 0941   BASOSABS 0.0 10/08/2017 0941   Iron/TIBC/Ferritin/ %Sat No results found for: IRON, TIBC, FERRITIN, IRONPCTSAT Lipid Panel     Component Value Date/Time   CHOL 183 10/08/2017 0941   TRIG 62 10/08/2017 0941   HDL 72 10/08/2017 0941   CHOLHDL 2.5 10/08/2017 0941   LDLCALC 99 10/08/2017 0941   Hepatic Function Panel     Component Value Date/Time   PROT 7.5 10/08/2017 0941   ALBUMIN 4.6 10/08/2017 0941   AST 20 10/08/2017 0941   ALT 10 10/08/2017 0941   ALKPHOS 60 10/08/2017 0941   BILITOT 0.7 10/08/2017 0941      Component Value Date/Time   TSH 0.789 10/08/2017 0941   Results for DATHA, KISSINGER (MRN [id number]) as of 12/23/2017 14:44  Ref. Range 10/08/2017 09:41  Vitamin D, 25-Hydroxy Latest Ref Range: 30.0 - 100.0 ng/mL 27.6 (L)   ASSESSMENT AND PLAN: Other fatigue  Class 2 severe obesity with serious comorbidity and body mass index (BMI) of 37.0 to 37.9 in adult, unspecified obesity type (HCC)  PLAN:  Fatigue Shon was informed that her fatigue may be related to obesity, depression or many other causes. Sonal has agreed to work on diet, exercise and weight loss to help with fatigue. Proper sleep hygiene was discussed including the need for 7-8 hours of quality sleep each night. We discussed the importance of self care and sleep. The fatigue is likely due to stress and we will follow up in 2 weeks.  I spent > than 50% of the 25 minute visit  on counseling as documented in the note.  Obesity [name] is currently in the action stage of change. As such, her goal is to continue with weight loss efforts. She has agreed to change to keeping a food journal with 1200 calories and 75 grams of protein.  [name] has been instructed to work up to a goal of 150 minutes of combined cardio and  strengthening exercise per week for weight loss and overall health benefits. We discussed the following Behavioral Modification Strategies today: increasing lean protein intake, decreasing simple carbohydrates, work on meal planning and easy cooking plans, and dealing with family or coworker sabotage.  Linzie has agreed to follow up with our clinic in 2 weeks. She was informed of the importance of frequent follow up visits to maximize her success with intensive lifestyle modifications for her multiple health conditions.   OBESITY BEHAVIORAL INTERVENTION VISIT  Today's visit was # 5  Starting weight: 234 lbs Starting date: 10/08/17 Today's weight : Weight: 233 lb (105.7 kg)  Today's date: 12/19/2017 Total lbs lost to date: 1  ASK: We discussed the diagnosis of obesity with Odelia Gage today and Miley agreed to give [name] permission to discuss obesity behavioral modification therapy today.  ASSESS: Talma has the diagnosis of obesity and her BMI today is 37.61. Armilda is in the action stage of change.   ADVISE: Ryleeann was educated on the multiple health risks of obesity as well as the benefit of weight loss to improve her health. She was advised of the need for long term treatment and the importance of lifestyle modifications to improve her current health and to decrease her risk of future health problems.  AGREE: Multiple dietary modification options and treatment options were discussed and Lizbett agreed to follow the recommendations documented in the above note.  ARRANGE: Daniela was educated on the importance of frequent visits to treat obesity as  outlined per CMS and USPSTF guidelines and agreed to schedule her next follow up appointment today.  I, Marcille Blanco, am acting as transcriptionist for Starlyn Skeans, MD  I have reviewed the above documentation for accuracy and completeness, and I agree with the above. -Dennard Nip, MD

## 2018-01-02 ENCOUNTER — Encounter (INDEPENDENT_AMBULATORY_CARE_PROVIDER_SITE_OTHER): Payer: Self-pay | Admitting: Family Medicine

## 2018-01-02 ENCOUNTER — Ambulatory Visit (INDEPENDENT_AMBULATORY_CARE_PROVIDER_SITE_OTHER): Payer: 59 | Admitting: Family Medicine

## 2018-01-02 VITALS — BP 109/77 | HR 77 | Temp 98.4°F | Ht 66.0 in | Wt 231.0 lb

## 2018-01-02 DIAGNOSIS — Z9189 Other specified personal risk factors, not elsewhere classified: Secondary | ICD-10-CM

## 2018-01-02 DIAGNOSIS — E559 Vitamin D deficiency, unspecified: Secondary | ICD-10-CM

## 2018-01-02 DIAGNOSIS — Z6837 Body mass index (BMI) 37.0-37.9, adult: Secondary | ICD-10-CM

## 2018-01-02 MED ORDER — VITAMIN D (ERGOCALCIFEROL) 1.25 MG (50000 UNIT) PO CAPS: 50000.0000 [IU] | ORAL_CAPSULE | ORAL | 0 refills | Status: DC

## 2018-01-02 NOTE — Progress Notes (Signed)
Office: [phone]  /  Fax: [phone]   HPI:   Chief Complaint: OBESITY Allison Valenzuela is here to discuss her progress with her obesity treatment plan. She is keeping a food journal with 1200 calories and 75+ grams of protein and is following her eating plan approximately 70 % of the time. She states she is exercising 0 minutes 0 times per week. Allison Valenzuela has done well with weight loss and is doing better meeting her protein goals. Her hunger is controlled and she is working on meal prepping.  Her weight is 231 lb (104.8 kg) today and has had a weight loss of 2 pounds over a period of 2 weeks since her last visit. She has lost 3 lbs since starting treatment with [name].  Vitamin D deficiency Allison Valenzuela has a diagnosis of vitamin D deficiency. She is currently taking vit D, but is not yet att goal. She denies nausea, vomiting, or muscle weakness.  At risk for osteopenia and osteoporosis Allison Valenzuela is at higher risk of osteopenia and osteoporosis due to vitamin D deficiency.   ASSESSMENT AND PLAN:  Vitamin D deficiency - Plan: Vitamin D, Ergocalciferol, (DRISDOL) 1.25 MG (50000 UT) CAPS capsule  At risk for osteoporosis  Class 2 severe obesity with serious comorbidity and body mass index (BMI) of 37.0 to 37.9 in adult, unspecified obesity type ([place])  PLAN:  Vitamin D Deficiency Allison Valenzuela was informed that low vitamin D levels contributes to fatigue and are associated with obesity, breast, and colon cancer. She agrees to continue to take prescription Vit D @50 ,000 IU every week #4 with no refills and will follow up for routine testing of vitamin D, at least 2-3 times per year. She was informed of the risk of over-replacement of vitamin D and agrees to not increase her dose unless she discusses this with [name] first. Allison Valenzuela agrees to follow up as directed in 3 to 4 weeks.  At risk for osteopenia and osteoporosis Allison Valenzuela was given extended (15 minutes) osteoporosis prevention counseling today. Allison Valenzuela is at risk for  osteopenia and osteoporosis due to her vitamin D deficiency. She was encouraged to take her vitamin D and follow her higher calcium diet and increase strengthening exercise to help strengthen her bones and decrease her risk of osteopenia and osteoporosis.  Obesity Allison Valenzuela is currently in the action stage of change. As such, her goal is to continue with weight loss efforts. She has agreed to keep a food journal with 1200  calories and 75+ grams of protein.  Allison Valenzuela has been instructed to work up to a goal of 150 minutes of combined cardio and strengthening exercise per week for weight loss and overall health benefits. We discussed the following Behavioral Modification Strategies today: decreasing simple carbohydrates, increasing vegetables, and work on meal planning and easy cooking plans.  Allison Valenzuela has agreed to follow up with our clinic in 3 to 4 weeks. She was informed of the importance of frequent follow up visits to maximize her success with intensive lifestyle modifications for her multiple health conditions.  ALLERGIES: Allergies  Allergen Reactions  . Dye Fdc Blue 1 [Brilliant Blue Fcf]   . Nickel     MEDICATIONS: Current Outpatient Medications on File Prior to Visit  Medication Sig Dispense Refill  . Ascorbic Acid (VITAMIN C) 1000 MG tablet Take 1,000 mg by mouth daily.    . Cholecalciferol (VITAMIN D) 400 UNIT/ML LIQD Take by mouth.    . Cyanocobalamin (VITAMIN B 12) 100 MCG LOZG Take by mouth.    Marland Kitchen  ELIQUIS 5 MG TABS tablet Take 5 mg by mouth 2 (two) times daily.  2  . fexofenadine (ALLEGRA) 180 MG tablet Take 180 mg by mouth daily.    . IRON PO Take 65 mg by mouth daily.    [name] [facility]  Onychomycosis Nail Lacquer -  Fluconazole 2%, Terbinafine 1% DMSO/UNDECYLENIC ACID 25% Apply to affected nail once daily Qty. 120 gm 3 refills    . Probiotic Product (PROBIOTIC-10 PO) Take 1 capsule by mouth daily.    . pseudoephedrine (SUDAFED) 120 MG 12 hr tablet Take 120  mg by mouth daily.     No current facility-administered medications on file prior to visit.     PAST MEDICAL HISTORY: Past Medical History:  Diagnosis Date  . Acid reflux   . Anemia   . Back pain   . Bilateral bunions   . Chest pain   . Constipation   . DVT (deep venous thrombosis) (HCC)    right leg  . Dyspnea   . Gestational diabetes   . HTN (hypertension)   . Lactose intolerance   . Leg edema   . Vitamin D deficiency     PAST SURGICAL HISTORY: Past Surgical History:  Procedure Laterality Date  . ROUX-EN-Y PROCEDURE      SOCIAL HISTORY: Social History   Tobacco Use  . Smoking status: Never Smoker  . Smokeless tobacco: Never Used  Substance Use Topics  . Alcohol use: Yes    Comment: 1-2 a week  . Drug use: Never    FAMILY HISTORY: Family History  Problem Relation Age of Onset  . Hypertension Mother   . Heart disease Mother   . Thyroid disease Mother   . Obesity Mother    ROS: Review of Systems  Constitutional: Positive for weight loss.  Gastrointestinal: Negative for nausea and vomiting.  Musculoskeletal:       Negative for muscle weakness.   PHYSICAL EXAM: Blood pressure 109/77, pulse 77, temperature 98.4 F (36.9 C), temperature source Oral, height 5\' 6"  (1.676 m), weight 231 lb (104.8 kg), SpO2 98 %. Body mass index is 37.28 kg/m. Physical Exam Vitals signs reviewed.  Constitutional:      Appearance: Normal appearance. She is obese.  Cardiovascular:     Rate and Rhythm: Normal rate.  Pulmonary:     Effort: Pulmonary effort is normal.  Musculoskeletal: Normal range of motion.  Skin:    General: Skin is warm and dry.  Neurological:     Mental Status: She is alert and oriented to person, place, and time.  Psychiatric:        Mood and Affect: Mood normal.        Behavior: Behavior normal.    RECENT LABS AND TESTS: BMET    Component Value Date/Time   NA 137 10/08/2017 0941   K 4.1 10/08/2017 0941   CL 100 10/08/2017 0941   CO2 22  10/08/2017 0941   GLUCOSE 82 10/08/2017 0941   BUN 13 10/08/2017 0941   CREATININE 0.84 10/08/2017 0941   CALCIUM 9.1 10/08/2017 0941   GFRNONAA 88 10/08/2017 0941   GFRAA 101 10/08/2017 0941   Lab Results  Component Value Date   HGBA1C 5.1 10/08/2017   Lab Results  Component Value Date   INSULIN 4.3 10/08/2017   CBC    Component Value Date/Time   WBC 3.0 (L) 10/08/2017 0941   RBC 3.89 10/08/2017 0941   HGB 11.7 10/08/2017 0941   HCT 36.4 10/08/2017 0941  PLT 270 10/08/2017 0941   MCV 94 10/08/2017 0941   MCH 30.1 10/08/2017 0941   MCHC 32.1 10/08/2017 0941   RDW 13.1 10/08/2017 0941   LYMPHSABS 1.6 10/08/2017 0941   EOSABS 0.1 10/08/2017 0941   BASOSABS 0.0 10/08/2017 0941   Iron/TIBC/Ferritin/ %Sat No results found for: IRON, TIBC, FERRITIN, IRONPCTSAT Lipid Panel     Component Value Date/Time   CHOL 183 10/08/2017 0941   TRIG 62 10/08/2017 0941   HDL 72 10/08/2017 0941   CHOLHDL 2.5 10/08/2017 0941   LDLCALC 99 10/08/2017 0941   Hepatic Function Panel     Component Value Date/Time   PROT 7.5 10/08/2017 0941   ALBUMIN 4.6 10/08/2017 0941   AST 20 10/08/2017 0941   ALT 10 10/08/2017 0941   ALKPHOS 60 10/08/2017 0941   BILITOT 0.7 10/08/2017 0941      Component Value Date/Time   TSH 0.789 10/08/2017 0941   Results for SHANDI, GODFREY (MRN [id number]) as of 01/02/2018 12:40  Ref. Range 10/08/2017 09:41  Vitamin D, 25-Hydroxy Latest Ref Range: 30.0 - 100.0 ng/mL 27.6 (L)    OBESITY BEHAVIORAL INTERVENTION VISIT  Today's visit was # 6   Starting weight: 234 lbs Starting date: 10/08/17 Today's weight : Weight: 231 lb (104.8 kg)  Today's date: 01/02/2018 Total lbs lost to date: 3  ASK: We discussed the diagnosis of obesity with Odelia Gage today and Malyna agreed to give [name] permission to discuss obesity behavioral modification therapy today.  ASSESS: Cait has the diagnosis of obesity and her BMI today is 37.3. Ermine is in the action stage  of change.   ADVISE: Stefana was educated on the multiple health risks of obesity as well as the benefit of weight loss to improve her health. She was advised of the need for long term treatment and the importance of lifestyle modifications to improve her current health and to decrease her risk of future health problems.  AGREE: Multiple dietary modification options and treatment options were discussed and Liz agreed to follow the recommendations documented in the above note.  ARRANGE: Rmoni was educated on the importance of frequent visits to treat obesity as outlined per CMS and USPSTF guidelines and agreed to schedule her next follow up appointment today.  I, Marcille Blanco, am acting as transcriptionist for Starlyn Skeans, MD I have reviewed the above documentation for accuracy and completeness, and I agree with the above. -Dennard Nip, MD

## 2018-01-16 ENCOUNTER — Other Ambulatory Visit: Payer: Self-pay

## 2018-01-16 ENCOUNTER — Encounter (HOSPITAL_BASED_OUTPATIENT_CLINIC_OR_DEPARTMENT_OTHER): Payer: Self-pay | Admitting: *Deleted

## 2018-01-16 ENCOUNTER — Emergency Department (HOSPITAL_BASED_OUTPATIENT_CLINIC_OR_DEPARTMENT_OTHER): Payer: 59

## 2018-01-16 ENCOUNTER — Emergency Department (HOSPITAL_BASED_OUTPATIENT_CLINIC_OR_DEPARTMENT_OTHER)
Admission: EM | Admit: 2018-01-16 | Discharge: 2018-01-16 | Disposition: A | Discharge status: Home / Self Care | Payer: 59 | Attending: Emergency Medicine | Admitting: Emergency Medicine

## 2018-01-16 DIAGNOSIS — R42 Dizziness and giddiness: Secondary | ICD-10-CM

## 2018-01-16 DIAGNOSIS — Z79899 Other long term (current) drug therapy: Secondary | ICD-10-CM | POA: Diagnosis not present

## 2018-01-16 DIAGNOSIS — I10 Essential (primary) hypertension: Secondary | ICD-10-CM | POA: Insufficient documentation

## 2018-01-16 DIAGNOSIS — Z86718 Personal history of other venous thrombosis and embolism: Secondary | ICD-10-CM | POA: Insufficient documentation

## 2018-01-16 DIAGNOSIS — R0602 Shortness of breath: Secondary | ICD-10-CM | POA: Diagnosis present

## 2018-01-16 DIAGNOSIS — Z7901 Long term (current) use of anticoagulants: Secondary | ICD-10-CM | POA: Diagnosis not present

## 2018-01-16 LAB — CBC WITH DIFFERENTIAL/PLATELET
Abs Immature Granulocytes: 0.01 10*3/uL (ref 0.00–0.07)
Basophils Absolute: 0 10*3/uL (ref 0.0–0.1)
Basophils Relative: 0 %
Eosinophils Absolute: 0.1 10*3/uL (ref 0.0–0.5)
Eosinophils Relative: 2 %
HCT: 35.3 % — ABNORMAL LOW (ref 36.0–46.0)
Hemoglobin: 11.1 g/dL — ABNORMAL LOW (ref 12.0–15.0)
Immature Granulocytes: 0 %
Lymphocytes Relative: 43 %
Lymphs Abs: 1.8 10*3/uL (ref 0.7–4.0)
MCH: 29.4 pg (ref 26.0–34.0)
MCHC: 31.4 g/dL (ref 30.0–36.0)
MCV: 93.6 fL (ref 80.0–100.0)
Monocytes Absolute: 0.5 10*3/uL (ref 0.1–1.0)
Monocytes Relative: 11 %
Neutro Abs: 1.9 10*3/uL (ref 1.7–7.7)
Neutrophils Relative %: 44 %
Platelets: 259 10*3/uL (ref 150–400)
RBC: 3.77 MIL/uL — ABNORMAL LOW (ref 3.87–5.11)
RDW: 12.5 % (ref 11.5–15.5)
WBC: 4.3 10*3/uL (ref 4.0–10.5)
nRBC: 0 % (ref 0.0–0.2)

## 2018-01-16 LAB — COMPREHENSIVE METABOLIC PANEL
ALT: 14 U/L (ref 0–44)
AST: 21 U/L (ref 15–41)
Albumin: 4.3 g/dL (ref 3.5–5.0)
Alkaline Phosphatase: 52 U/L (ref 38–126)
Anion gap: 8 (ref 5–15)
BUN: 20 mg/dL (ref 6–20)
CO2: 21 mmol/L — ABNORMAL LOW (ref 22–32)
Calcium: 9.2 mg/dL (ref 8.9–10.3)
Chloride: 103 mmol/L (ref 98–111)
Creatinine, Ser: 0.9 mg/dL (ref 0.44–1.00)
GFR calc Af Amer: >60 mL/min (ref >60)
GFR calc non Af Amer: >60 mL/min (ref >60)
Glucose, Bld: 99 mg/dL (ref 70–99)
Potassium: 3.8 mmol/L (ref 3.5–5.1)
Sodium: 132 mmol/L — ABNORMAL LOW (ref 135–145)
Total Bilirubin: 0.8 mg/dL (ref 0.3–1.2)
Total Protein: 8.2 g/dL — ABNORMAL HIGH (ref 6.5–8.1)

## 2018-01-16 LAB — D-DIMER, QUANTITATIVE: D-Dimer, Quant: 0.3 ug/mL-FEU (ref 0.00–0.50)

## 2018-01-16 LAB — TROPONIN I: Troponin I: <0.03 ng/mL (ref <0.03)

## 2018-01-16 MED ORDER — IOPAMIDOL (ISOVUE-370) INJECTION 76%
100.0000 mL | Freq: Once | INTRAVENOUS | Status: AC | PRN
  Administered 2018-01-16: 80 mL via INTRAVENOUS

## 2018-01-16 NOTE — Discharge Instructions (Addendum)
Your testing here today did not show any significant abnormalities.  There is no blood clots shown on your CT scan or other abnormality on your CT.  Return here for any worsening in your condition.  Follow-up with your primary care doctor

## 2018-01-16 NOTE — ED Triage Notes (Signed)
Pt c/o SOB with exertion and laying flat x 1 hr

## 2018-01-16 NOTE — ED Provider Notes (Signed)
[facility] EMERGENCY DEPARTMENT Provider Note   CSN: [id number] Arrival date & time: 01/16/18  1544     History   Chief Complaint Chief Complaint  Patient presents with  . Shortness of Breath    HPI Allison Valenzuela is a 38 y.o. female.  HPI Patient presents to the emergency department with an episode of dizziness and shortness of breath.  The patient has had some nasal congestion and drainage but the symptoms felt different.  She states she has a DVT and was concerned she may have a blood clot in her lung.  Patient is also having some chest discomfort earlier in the day.  The patient denies headache,blurred vision, neck pain, fever, cough, weakness, numbness, dizziness, anorexia, edema, abdominal pain, nausea, vomiting, diarrhea, rash, back pain, dysuria, hematemesis, bloody stool, near syncope, or syncope. Past Medical History:  Diagnosis Date  . Acid reflux   . Anemia   . Back pain   . Bilateral bunions   . Chest pain   . Constipation   . DVT (deep venous thrombosis) (HCC)    right leg  . Dyspnea   . Gestational diabetes   . HTN (hypertension)   . Lactose intolerance   . Leg edema   . Vitamin D deficiency     Patient Active Problem List   Diagnosis Date Noted  . Ingrown toenail 09/12/2017  . Onychodystrophy 09/12/2017    Past Surgical History:  Procedure Laterality Date  . ROUX-EN-Y PROCEDURE       OB History    Gravida  3   Para      Term      Preterm      AB      Living  3     SAB      TAB      Ectopic      Multiple      Live Births               Home Medications    Prior to Admission medications   Medication Sig Start Date End Date Taking? Authorizing Provider  Ascorbic Acid (VITAMIN C) 1000 MG tablet Take 1,000 mg by mouth daily.    [provider]  Cholecalciferol (VITAMIN D) 400 UNIT/ML LIQD Take by mouth.    [provider]  Cyanocobalamin (VITAMIN B 12) 100 MCG LOZG Take by mouth.    [provider]  ELIQUIS 5 MG TABS tablet Take 5 mg by mouth 2 (two) times daily. 08/16/17   [provider]  fexofenadine (ALLEGRA) 180 MG tablet Take 180 mg by mouth daily.    [provider]  IRON PO Take 65 mg by mouth daily.    [provider]  NON [facility]ULARY [facility]  Onychomycosis Nail Lacquer -  Fluconazole 2%, Terbinafine 1% DMSO/UNDECYLENIC ACID 25% Apply to affected nail once daily Qty. 120 gm 3 refills    [provider]  Probiotic Product (PROBIOTIC-10 PO) Take 1 capsule by mouth daily.    [provider]  pseudoephedrine (SUDAFED) 120 MG 12 hr tablet Take 120 mg by mouth daily.    [provider]  Vitamin D, Ergocalciferol, (DRISDOL) 1.25 MG (50000 UT) CAPS capsule Take 1 capsule (50,000 Units total) by mouth every 7 (seven) days. 01/02/18   Starlyn Skeans, MD    Family History Family History  Problem Relation Age of Onset  . Hypertension Mother   . Heart disease Mother   . Thyroid disease  Mother   . Obesity Mother     Social History Social History   Tobacco Use  . Smoking status: Never Smoker  . Smokeless tobacco: Never Used  Substance Use Topics  . Alcohol use: Yes    Comment: 1-2 a week  . Drug use: Never     Allergies   Dye fdc blue 1 [brilliant blue fcf] and Nickel   Review of Systems Review of Systems All other systems negative except as documented in the HPI. All pertinent positives and negatives as reviewed in the HPI.  Physical Exam Updated Vital Signs BP (!) 126/96   Pulse 64   Temp 98.3 F (36.8 C) (Oral)   Resp 15   Ht 5\' 7"  (1.702 m)   Wt 107 kg   LMP 12/26/2017   SpO2 100%   BMI 36.96 kg/m   Physical Exam Vitals signs and nursing note reviewed.  Constitutional:      General: She is not in acute distress.    Appearance: She is well-developed.  HENT:     Head: Normocephalic and atraumatic.  Eyes:     Pupils: Pupils are equal, round, and reactive to light.  Neck:       Musculoskeletal: Normal range of motion and neck supple.  Cardiovascular:     Rate and Rhythm: Normal rate and regular rhythm.     Heart sounds: Normal heart sounds. No murmur. No friction rub. No gallop.   Pulmonary:     Effort: Pulmonary effort is normal. No respiratory distress.     Breath sounds: Normal breath sounds. No wheezing or rhonchi.  Abdominal:     General: Bowel sounds are normal. There is no distension.     Palpations: Abdomen is soft.     Tenderness: There is no abdominal tenderness.  Skin:    General: Skin is warm and dry.     Capillary Refill: Capillary refill takes less than 2 seconds.     Findings: No erythema or rash.  Neurological:     Mental Status: She is alert and oriented to person, place, and time.     Motor: No abnormal muscle tone.     Coordination: Coordination normal.  Psychiatric:        Behavior: Behavior normal.      ED Treatments / Results  Labs (all labs ordered are listed, but only abnormal results are displayed) Labs Reviewed  CBC WITH DIFFERENTIAL/PLATELET - Abnormal; Notable for the following components:      Result Value   RBC 3.77 (*)    Hemoglobin 11.1 (*)    HCT 35.3 (*)    All other components within normal limits  COMPREHENSIVE METABOLIC PANEL - Abnormal; Notable for the following components:   Sodium 132 (*)    CO2 21 (*)    Total Protein 8.2 (*)    All other components within normal limits  TROPONIN I  D-DIMER, QUANTITATIVE (NOT AT [facility])    EKG None  Radiology Ct Angio Chest Pe W/cm &/or Wo Cm  Result Date: 01/16/2018 CLINICAL DATA:  Dyspnea with exertion for 3 days. History of DVT in the right lower extremity. EXAM: CT ANGIOGRAPHY CHEST WITH CONTRAST TECHNIQUE: Multidetector CT imaging of the chest was performed using the standard protocol during bolus administration of intravenous contrast. Multiplanar CT image reconstructions and MIPs were obtained to evaluate the vascular anatomy. CONTRAST:  60mL ISOVUE-370  IOPAMIDOL (ISOVUE-370) INJECTION 76% COMPARISON:  None. FINDINGS: Cardiovascular: The study is moderate quality for the evaluation of pulmonary  embolism, with some motion degradation and mildly suboptimal contrast opacification. There are no filling defects in the central, lobar, segmental or subsegmental pulmonary artery branches to suggest acute pulmonary embolism. Great vessels are normal in course and caliber. Top-normal heart size. No significant pericardial fluid/thickening. Mediastinum/Nodes: No discrete thyroid nodules. Unremarkable esophagus. No pathologically enlarged axillary, mediastinal or hilar lymph nodes. Lungs/Pleura: No pneumothorax. No pleural effusion. No acute consolidative airspace disease, lung masses or significant pulmonary nodules. Upper abdomen: No acute abnormality. 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 Musculoskeletal: No aggressive appearing focal osseous lesions. Dextrocurvature of the thoracic spine. Review of the MIP images confirms the above findings. IMPRESSION: No pulmonary embolism. No active pulmonary disease. Electronically Signed   By: Ilona Sorrel M.D.   On: 01/16/2018 19:18    Procedures Procedures (including critical care time)  Medications Ordered in ED Medications  iopamidol (ISOVUE-370) 76 % injection 100 mL (80 mLs Intravenous Contrast Given 01/16/18 1840)     Initial Impression / Assessment and Plan / ED Course  I have reviewed the triage vital signs and the nursing notes.  Pertinent labs & imaging results that were available during my care of the patient were reviewed by me and considered in my medical decision making (see chart for details).    Patient does not have any obvious signs of significant abnormality right now.  Patient does not have any vital sign abnormalities at this time.  I do not feel that there is a significant cause for her intermittent shortness of breath and chest discomfort that are short in duration.  The  dizziness was fleeting as well.  Advised the patient to increase her fluid intake and follow-up with her primary care doctor  Final Clinical Impressions(s) / ED Diagnoses   Final diagnoses:  None    ED Discharge Orders    None       Dalia Heading, PA-C 01/16/18 1947    Julianne Rice, MD 01/18/18 [phone]

## 2018-01-16 NOTE — ED Notes (Signed)
Patient transported to CT 

## 2018-01-28 ENCOUNTER — Other Ambulatory Visit (INDEPENDENT_AMBULATORY_CARE_PROVIDER_SITE_OTHER): Payer: Self-pay | Admitting: Family Medicine

## 2018-01-28 DIAGNOSIS — E559 Vitamin D deficiency, unspecified: Secondary | ICD-10-CM

## 2018-02-03 ENCOUNTER — Encounter (INDEPENDENT_AMBULATORY_CARE_PROVIDER_SITE_OTHER): Payer: Self-pay | Admitting: Family Medicine

## 2018-02-03 ENCOUNTER — Ambulatory Visit (INDEPENDENT_AMBULATORY_CARE_PROVIDER_SITE_OTHER): Payer: 59 | Admitting: Family Medicine

## 2018-02-03 VITALS — BP 120/81 | HR 68 | Temp 97.8°F | Ht 67.0 in | Wt 234.0 lb

## 2018-02-03 DIAGNOSIS — E559 Vitamin D deficiency, unspecified: Secondary | ICD-10-CM

## 2018-02-03 DIAGNOSIS — Z9189 Other specified personal risk factors, not elsewhere classified: Secondary | ICD-10-CM

## 2018-02-03 DIAGNOSIS — Z6837 Body mass index (BMI) 37.0-37.9, adult: Secondary | ICD-10-CM | POA: Diagnosis not present

## 2018-02-03 DIAGNOSIS — E8881 Metabolic syndrome: Secondary | ICD-10-CM

## 2018-02-03 NOTE — Progress Notes (Signed)
Office: [phone]  /  Fax: [phone]   HPI:   Chief Complaint: OBESITY Allison Valenzuela is here to discuss her progress with her obesity treatment plan. She is keep a food journal with 1200 calories and 75 grams of protein and is following her eating plan approximately 50 % of the time. She states she is exercising 0 minutes 0 times per week. Tariya has gotten off track over the holidays, but is ready to get back to weight loss efforts. She often skips meals and is not meeting her protein goal.  Her weight is 234 lb (106.1 kg) today and has had a weight gain of 3 pounds over a period of 4 weeks since her last visit. She has lost 0 lbs since starting treatment with [name].  Vitamin D deficiency Allison Valenzuela has a diagnosis of vitamin D deficiency. She is currently stable on vit D. She is due for labs today and she denies nausea, vomiting, or muscle weakness.  Insulin Resistance Allison Valenzuela has a diagnosis of insulin resistance based on her elevated fasting insulin level >5. Although Allison Valenzuela's blood glucose readings are still under good control, insulin resistance puts her at greater risk of metabolic syndrome and diabetes. Brodie has been doing well controlling with diet, but has increased simple carbs recently. She is not taking metformin currently and continues to work on diet and exercise to decrease risk of diabetes. She admits polyphagia and is due for labs today.  At risk for cardiovascular disease Allison Valenzuela is at a higher than average risk for cardiovascular disease due to insulin resistance and obesity. She currently denies any chest pain.  ASSESSMENT AND PLAN:  Vitamin D deficiency - Plan: VITAMIN D 25 Hydroxy (Vit-D Deficiency, Fractures)  Insulin resistance - Plan: Comprehensive metabolic panel, Hemoglobin A1c, Insulin, random  At risk for heart disease  Class 2 severe obesity with serious comorbidity and body mass index (BMI) of 37.0 to 37.9 in adult, unspecified obesity type ([place])  PLAN:  Vitamin D  Deficiency Allison Valenzuela was informed that low vitamin D levels contributes to fatigue and are associated with obesity, breast, and colon cancer. She agrees to continue to take prescription Vit D @50 ,000 IU every week and will follow up for routine testing of vitamin D, at least 2-3 times per year. She was informed of the risk of over-replacement of vitamin D and agrees to not increase her dose unless she discusses this with [name] first. We will order labs today and she agrees to follow up in 2 weeks.  Insulin Resistance Allison Valenzuela will continue to work on weight loss, exercise, and decreasing simple carbohydrates in her diet to help decrease the risk of diabetes. She was informed that eating too many simple carbohydrates or too many calories at one sitting increases the likelihood of GI side effects. We will check labs and Allison Valenzuela agreed to follow up with [name] as directed to monitor her progress.  Cardiovascular risk counseling Allison Valenzuela was given extended (15 minutes) coronary artery disease prevention counseling today. She is 38 y.o. female and has risk factors for heart disease including insulin resistance and obesity. We discussed intensive lifestyle modifications today with an emphasis on specific weight loss instructions and strategies. Pt was also informed of the importance of increasing exercise and decreasing saturated fats to help prevent heart disease.  Obesity Allison Valenzuela is currently in the action stage of change. As such, her goal is to continue with weight loss efforts. She has agreed to change to follow the Category 1 plan. Allison Valenzuela has been  instructed to work up to a goal of 150 minutes of combined cardio and strengthening exercise per week for weight loss and overall health benefits. We discussed the following Behavioral Modification Strategies today: increasing lean protein intake, decreasing simple carbohydrates, and no skipping meals.   Allison Valenzuela has agreed to follow up with our clinic in 2 weeks. She was informed  of the importance of frequent follow up visits to maximize her success with intensive lifestyle modifications for her multiple health conditions.  ALLERGIES: Allergies  Allergen Reactions  . Dye Fdc Blue 1 [Brilliant Blue Fcf]   . Nickel     MEDICATIONS: Current Outpatient Medications on File Prior to Visit  Medication Sig Dispense Refill  . Ascorbic Acid (VITAMIN C) 1000 MG tablet Take 1,000 mg by mouth daily.    . Cholecalciferol (VITAMIN D) 400 UNIT/ML LIQD Take by mouth.    . Cyanocobalamin (VITAMIN B 12) 100 MCG LOZG Take by mouth.    [name] 5 MG TABS tablet Take 5 mg by mouth 2 (two) times daily.  2  . fexofenadine (ALLEGRA) 180 MG tablet Take 180 mg by mouth daily.    . IRON PO Take 65 mg by mouth daily.    [name] [facility]  Onychomycosis Nail Lacquer -  Fluconazole 2%, Terbinafine 1% DMSO/UNDECYLENIC ACID 25% Apply to affected nail once daily Qty. 120 gm 3 refills    . Probiotic Product (PROBIOTIC-10 PO) Take 1 capsule by mouth daily.    . pseudoephedrine (SUDAFED) 120 MG 12 hr tablet Take 120 mg by mouth daily.    . Vitamin D, Ergocalciferol, (DRISDOL) 1.25 MG (50000 UT) CAPS capsule Take 1 capsule (50,000 Units total) by mouth every 7 (seven) days. 4 capsule 0   No current facility-administered medications on file prior to visit.     PAST MEDICAL HISTORY: Past Medical History:  Diagnosis Date  . Acid reflux   . Anemia   . Back pain   . Bilateral bunions   . Chest pain   . Constipation   . DVT (deep venous thrombosis) (HCC)    right leg  . Dyspnea   . Gestational diabetes   . HTN (hypertension)   . Lactose intolerance   . Leg edema   . Vitamin D deficiency     PAST SURGICAL HISTORY: Past Surgical History:  Procedure Laterality Date  . ROUX-EN-Y PROCEDURE      SOCIAL HISTORY: Social History   Tobacco Use  . Smoking status: Never Smoker  . Smokeless tobacco: Never Used  Substance Use Topics  . Alcohol use: Yes    Comment: 1-2  a week  . Drug use: Never    FAMILY HISTORY: Family History  Problem Relation Age of Onset  . Hypertension Mother   . Heart disease Mother   . Thyroid disease Mother   . Obesity Mother    ROS: Review of Systems  Constitutional: Negative for weight loss.  Cardiovascular: Negative for chest pain.  Gastrointestinal: Negative for nausea and vomiting.  Musculoskeletal:       Negative for muscle weakness.  Endo/Heme/Allergies:       Positive for polyphagia.   PHYSICAL EXAM: Blood pressure 120/81, pulse 68, temperature 97.8 F (36.6 C), temperature source Oral, height 5\' 7"  (1.702 m), weight 234 lb (106.1 kg), SpO2 99 %. Body mass index is 36.65 kg/m. Physical Exam Vitals signs reviewed.  Constitutional:      Appearance: Normal appearance. She is obese.  Cardiovascular:  Rate and Rhythm: Normal rate.  Pulmonary:     Effort: Pulmonary effort is normal.  Musculoskeletal: Normal range of motion.  Skin:    General: Skin is warm and dry.  Neurological:     Mental Status: She is alert and oriented to person, place, and time.  Psychiatric:        Mood and Affect: Mood normal.        Behavior: Behavior normal.    RECENT LABS AND TESTS: BMET    Component Value Date/Time   NA 132 (L) 01/16/2018 1618   NA 137 10/08/2017 0941   K 3.8 01/16/2018 1618   CL 103 01/16/2018 1618   CO2 21 (L) 01/16/2018 1618   GLUCOSE 99 01/16/2018 1618   BUN 20 01/16/2018 1618   BUN 13 10/08/2017 0941   CREATININE 0.90 01/16/2018 1618   CALCIUM 9.2 01/16/2018 1618   GFRNONAA >60 01/16/2018 1618   GFRAA >60 01/16/2018 1618   Lab Results  Component Value Date   HGBA1C 5.1 10/08/2017   Lab Results  Component Value Date   INSULIN 4.3 10/08/2017   CBC    Component Value Date/Time   WBC 4.3 01/16/2018 1618   RBC 3.77 (L) 01/16/2018 1618   HGB 11.1 (L) 01/16/2018 1618   HGB 11.7 10/08/2017 0941   HCT 35.3 (L) 01/16/2018 1618   HCT 36.4 10/08/2017 0941   PLT 259 01/16/2018 1618    PLT 270 10/08/2017 0941   MCV 93.6 01/16/2018 1618   MCV 94 10/08/2017 0941   MCH 29.4 01/16/2018 1618   MCHC 31.4 01/16/2018 1618   RDW 12.5 01/16/2018 1618   RDW 13.1 10/08/2017 0941   LYMPHSABS 1.8 01/16/2018 1618   LYMPHSABS 1.6 10/08/2017 0941   MONOABS 0.5 01/16/2018 1618   EOSABS 0.1 01/16/2018 1618   EOSABS 0.1 10/08/2017 0941   BASOSABS 0.0 01/16/2018 1618   BASOSABS 0.0 10/08/2017 0941   Iron/TIBC/Ferritin/ %Sat No results found for: IRON, TIBC, FERRITIN, IRONPCTSAT Lipid Panel     Component Value Date/Time   CHOL 183 10/08/2017 0941   TRIG 62 10/08/2017 0941   HDL 72 10/08/2017 0941   CHOLHDL 2.5 10/08/2017 0941   LDLCALC 99 10/08/2017 0941   Hepatic Function Panel     Component Value Date/Time   PROT 8.2 (H) 01/16/2018 1618   PROT 7.5 10/08/2017 0941   ALBUMIN 4.3 01/16/2018 1618   ALBUMIN 4.6 10/08/2017 0941   AST 21 01/16/2018 1618   ALT 14 01/16/2018 1618   ALKPHOS 52 01/16/2018 1618   BILITOT 0.8 01/16/2018 1618   BILITOT 0.7 10/08/2017 0941      Component Value Date/Time   TSH 0.789 10/08/2017 0941   Results for ADELAE, YODICE (MRN [id number]) as of 02/03/2018 11:22  Ref. Range 10/08/2017 09:41  Vitamin D, 25-Hydroxy Latest Ref Range: 30.0 - 100.0 ng/mL 27.6 (L)    OBESITY BEHAVIORAL INTERVENTION VISIT  Today's visit was # 7   Starting weight: 10/08/17 Starting date: 234 lbs Today's weight : Weight: 234 lb (106.1 kg)  Today's date: 02/03/2018 Total lbs lost to date: 0  ASK: We discussed the diagnosis of obesity with Allison Valenzuela today and Khayla agreed to give [name] permission to discuss obesity behavioral modification therapy today.  ASSESS: Allison Valenzuela has the diagnosis of obesity and her BMI today is 36.6. Allison Valenzuela is in the action stage of change.   ADVISE: Allison Valenzuela was educated on the multiple health risks of obesity as well as the benefit of weight  loss to improve her health. She was advised of the need for long term treatment and the  importance of lifestyle modifications to improve her current health and to decrease her risk of future health problems.  AGREE: Multiple dietary modification options and treatment options were discussed and Allison Valenzuela agreed to follow the recommendations documented in the above note.  ARRANGE: Allison Valenzuela was educated on the importance of frequent visits to treat obesity as outlined per CMS and USPSTF guidelines and agreed to schedule her next follow up appointment today.  I, Allison Valenzuela, am acting as transcriptionist for Starlyn Skeans, MD  I have reviewed the above documentation for accuracy and completeness, and I agree with the above. -Dennard Nip, MD

## 2018-02-04 LAB — COMPREHENSIVE METABOLIC PANEL
ALT: 18 IU/L (ref 0–32)
AST: 22 IU/L (ref 0–40)
Albumin/Globulin Ratio: 1.5 (ref 1.2–2.2)
Albumin: 4.3 g/dL (ref 3.8–4.8)
Alkaline Phosphatase: 57 IU/L (ref 39–117)
BUN/Creatinine Ratio: 14 (ref 9–23)
BUN: 13 mg/dL (ref 6–20)
Bilirubin Total: 0.7 mg/dL (ref 0.0–1.2)
CO2: 22 mmol/L (ref 20–29)
Calcium: 9.1 mg/dL (ref 8.7–10.2)
Chloride: 103 mmol/L (ref 96–106)
Creatinine, Ser: 0.91 mg/dL (ref 0.57–1.00)
GFR calc Af Amer: 92 mL/min/{1.73_m2} (ref >59)
GFR calc non Af Amer: 80 mL/min/{1.73_m2} (ref >59)
Globulin, Total: 2.8 g/dL (ref 1.5–4.5)
Glucose: 94 mg/dL (ref 65–99)
Potassium: 4.2 mmol/L (ref 3.5–5.2)
Sodium: 138 mmol/L (ref 134–144)
Total Protein: 7.1 g/dL (ref 6.0–8.5)

## 2018-02-04 LAB — VITAMIN D 25 HYDROXY (VIT D DEFICIENCY, FRACTURES): Vit D, 25-Hydroxy: 31.3 ng/mL (ref 30.0–100.0)

## 2018-02-04 LAB — HEMOGLOBIN A1C
Est. average glucose Bld gHb Est-mCnc: 100 mg/dL
Hgb A1c MFr Bld: 5.1 % (ref 4.8–5.6)

## 2018-02-04 LAB — INSULIN, RANDOM: INSULIN: 9.1 u[IU]/mL (ref 2.6–24.9)

## 2018-02-18 ENCOUNTER — Encounter (INDEPENDENT_AMBULATORY_CARE_PROVIDER_SITE_OTHER): Payer: Self-pay | Admitting: Family Medicine

## 2018-02-18 ENCOUNTER — Ambulatory Visit (INDEPENDENT_AMBULATORY_CARE_PROVIDER_SITE_OTHER): Payer: 59 | Admitting: Family Medicine

## 2018-02-18 VITALS — BP 121/85 | HR 72 | Temp 98.4°F | Ht 67.0 in | Wt 237.0 lb

## 2018-02-18 DIAGNOSIS — Z9189 Other specified personal risk factors, not elsewhere classified: Secondary | ICD-10-CM | POA: Diagnosis not present

## 2018-02-18 DIAGNOSIS — Z6837 Body mass index (BMI) 37.0-37.9, adult: Secondary | ICD-10-CM | POA: Diagnosis not present

## 2018-02-18 DIAGNOSIS — E559 Vitamin D deficiency, unspecified: Secondary | ICD-10-CM | POA: Diagnosis not present

## 2018-02-18 DIAGNOSIS — E8881 Metabolic syndrome: Secondary | ICD-10-CM | POA: Diagnosis not present

## 2018-02-18 MED ORDER — VITAMIN D (ERGOCALCIFEROL) 1.25 MG (50000 UNIT) PO CAPS: 50000.0000 [IU] | ORAL_CAPSULE | ORAL | 0 refills | Status: DC

## 2018-02-19 ENCOUNTER — Encounter (INDEPENDENT_AMBULATORY_CARE_PROVIDER_SITE_OTHER): Payer: Self-pay | Admitting: Family Medicine

## 2018-02-19 DIAGNOSIS — E559 Vitamin D deficiency, unspecified: Secondary | ICD-10-CM | POA: Insufficient documentation

## 2018-02-19 DIAGNOSIS — E8881 Metabolic syndrome: Secondary | ICD-10-CM | POA: Insufficient documentation

## 2018-02-19 DIAGNOSIS — E88819 Insulin resistance, unspecified: Secondary | ICD-10-CM | POA: Insufficient documentation

## 2018-02-19 DIAGNOSIS — Z6836 Body mass index (BMI) 36.0-36.9, adult: Secondary | ICD-10-CM | POA: Insufficient documentation

## 2018-02-19 DIAGNOSIS — Z6837 Body mass index (BMI) 37.0-37.9, adult: Secondary | ICD-10-CM

## 2018-02-19 NOTE — Progress Notes (Signed)
Office: [phone]  /  Fax: [phone]   HPI:   Chief Complaint: OBESITY Allison Valenzuela is here to discuss her progress with her obesity treatment plan. She is on the Category 1 plan and is following her eating plan approximately 60 % of the time. She states she is exercising 0 minutes 0 times per week. Allison Valenzuela sticks to plan well at work but struggles in the evening and on weekends. She often skips meals on weekends and eats only one meal a day. She feels nauseated if she eats without being hungry. Her weight is 237 lb (107.5 kg) today and has gained 3 lbs since her last visit. She has lost 0 lbs since starting treatment with [name].  Vitamin D deficiency Allison Valenzuela has a diagnosis of vitamin D deficiency. She is currently taking prescription Vit D and denies nausea, vomiting or muscle weakness. Her last Vit D level was 31.3 on 02/03/18, an increase of only 3 points since 9/24 when she was started on Rx vitamin D. She is not at goal. She denies nausea, vomiting or muscle weakness. She reports fatigue.  Insulin Resistance Allison Valenzuela has a diagnosis of insulin resistance based on her elevated fasting insulin level >5. Although Allison Valenzuela's blood glucose readings are still under good control, insulin resistance puts her at greater risk of metabolic syndrome and diabetes. She is not taking metformin currently and continues to work on diet and exercise to decrease risk of diabetes. She denies polyphagia.  Diabetes risk counseling Allison Valenzuela was given extended (15 minutes) diabetes prevention counseling today. She is 38 y.o. female and has risk factors for diabetes including obesity. We discussed intensive lifestyle modifications today with an emphasis on weight loss as well as increasing exercise and decreasing simple carbohydrates in her diet.   ASSESSMENT AND PLAN:  Vitamin D deficiency - Plan: Vitamin D, Ergocalciferol, (DRISDOL) 1.25 MG (50000 UT) CAPS capsule  Insulin resistance  At risk for diabetes  mellitus  Class 2 severe obesity with serious comorbidity and body mass index (BMI) of 37.0 to 37.9 in adult, unspecified obesity type ([place])  PLAN:  Vitamin D Deficiency Allison Valenzuela was informed that low vitamin D levels contributes to fatigue and are associated with obesity, breast, and colon cancer. Allison Valenzuela agrees to increase dose of Vit D to 50,000 IU every 3 days #10 with no refills. She will follow up for routine testing of vitamin D, at least 2-3 times per year. She was informed of the risk of over-replacement of vitamin D and agrees to not increase her dose unless she discusses this with [name] first. Allison Valenzuela agrees to follow up with our clinic in 3 weeks.  Insulin Resistance Allison Valenzuela will continue to work on weight loss, exercise, and decreasing simple carbohydrates in her diet to help decrease the risk of diabetes.   Vickie agrees to follow up with our clinic in 3 weeks.   Diabetes risk counseling Allison Valenzuela was given extended (15 minutes) diabetes prevention counseling today. She is 38 y.o. female and has risk factors for diabetes including obesity. We discussed intensive lifestyle modifications today with an emphasis on weight loss as well as increasing exercise and decreasing simple carbohydrates in her diet.  Obesity Allison Valenzuela is currently in the action stage of change. As such, her goal is to continue with weight loss efforts She has agreed to keep a food journal with 1200 calories and 75 grams of protein daily Allison Valenzuela has not been prescribed exercise  We discussed the following Behavioral Modification Strategies today: increasing lean protein intake,  increase H20 intake, no skipping meals, work on meal planning and easy cooking plans, planning for success and keep a strict food journal  Allison Valenzuela has agreed to follow up with our clinic in 3 weeks. She was informed of the importance of frequent follow up visits to maximize her success with intensive lifestyle modifications for her multiple health  conditions.  ALLERGIES: Allergies  Allergen Reactions  . Dye Fdc Blue 1 [Brilliant Blue Fcf]   . Nickel     MEDICATIONS: Current Outpatient Medications on File Prior to Visit  Medication Sig Dispense Refill  . Ascorbic Acid (VITAMIN C) 1000 MG tablet Take 1,000 mg by mouth daily.    . Cholecalciferol (VITAMIN D) 400 UNIT/ML LIQD Take by mouth.    . Cyanocobalamin (VITAMIN B 12) 100 MCG LOZG Take by mouth.    . fexofenadine (ALLEGRA) 180 MG tablet Take 180 mg by mouth daily.    . IRON PO Take 65 mg by mouth daily.    Allison Valenzuela [facility]  Onychomycosis Nail Lacquer -  Fluconazole 2%, Terbinafine 1% DMSO/UNDECYLENIC ACID 25% Apply to affected nail once daily Qty. 120 gm 3 refills    . Probiotic Product (PROBIOTIC-10 PO) Take 1 capsule by mouth daily.    . pseudoephedrine (SUDAFED) 120 MG 12 hr tablet Take 120 mg by mouth daily.    . rivaroxaban (XARELTO) 20 MG TABS tablet Take 20 mg by mouth daily with supper.     No current facility-administered medications on file prior to visit.     PAST MEDICAL HISTORY: Past Medical History:  Diagnosis Date  . Acid reflux   . Anemia   . Back pain   . Bilateral bunions   . Chest pain   . Constipation   . DVT (deep venous thrombosis) (HCC)    right leg  . Dyspnea   . Gestational diabetes   . HTN (hypertension)   . Lactose intolerance   . Leg edema   . Vitamin D deficiency     PAST SURGICAL HISTORY: Past Surgical History:  Procedure Laterality Date  . ROUX-EN-Y PROCEDURE      SOCIAL HISTORY: Social History   Tobacco Use  . Smoking status: Never Smoker  . Smokeless tobacco: Never Used  Substance Use Topics  . Alcohol use: Yes    Comment: 1-2 a week  . Drug use: Never    FAMILY HISTORY: Family History  Problem Relation Age of Onset  . Hypertension Mother   . Heart disease Mother   . Thyroid disease Mother   . Obesity Mother     ROS: Review of Systems  Constitutional: Positive for malaise/fatigue.  Negative for weight loss.  Gastrointestinal: Negative for nausea and vomiting.  Musculoskeletal:       Negative for muscle weakness  Endo/Heme/Allergies: Negative for polydipsia.       Negative for polyphagia    PHYSICAL EXAM: Blood pressure 121/85, pulse 72, temperature 98.4 F (36.9 C), temperature source Oral, height 5\' 7"  (1.702 m), weight 237 lb (107.5 kg), SpO2 98 %. Body mass index is 37.12 kg/m. Physical Exam Vitals signs reviewed.  Constitutional:      Appearance: Normal appearance. She is obese.  Cardiovascular:     Rate and Rhythm: Normal rate.     Pulses: Normal pulses.  Pulmonary:     Effort: Pulmonary effort is normal.  Musculoskeletal: Normal range of motion.  Skin:    General: Skin is warm and dry.  Neurological:     Mental  Status: She is alert and oriented to person, place, and time.  Psychiatric:        Mood and Affect: Mood normal.        Behavior: Behavior normal.     RECENT LABS AND TESTS: BMET    Component Value Date/Time   NA 138 02/03/2018 1103   K 4.2 02/03/2018 1103   CL 103 02/03/2018 1103   CO2 22 02/03/2018 1103   GLUCOSE 94 02/03/2018 1103   GLUCOSE 99 01/16/2018 1618   BUN 13 02/03/2018 1103   CREATININE 0.91 02/03/2018 1103   CALCIUM 9.1 02/03/2018 1103   GFRNONAA 80 02/03/2018 1103   GFRAA 92 02/03/2018 1103   Lab Results  Component Value Date   HGBA1C 5.1 02/03/2018   HGBA1C 5.1 10/08/2017   Lab Results  Component Value Date   INSULIN 9.1 02/03/2018   INSULIN 4.3 10/08/2017   CBC    Component Value Date/Time   WBC 4.3 01/16/2018 1618   RBC 3.77 (L) 01/16/2018 1618   HGB 11.1 (L) 01/16/2018 1618   HGB 11.7 10/08/2017 0941   HCT 35.3 (L) 01/16/2018 1618   HCT 36.4 10/08/2017 0941   PLT 259 01/16/2018 1618   PLT 270 10/08/2017 0941   MCV 93.6 01/16/2018 1618   MCV 94 10/08/2017 0941   MCH 29.4 01/16/2018 1618   MCHC 31.4 01/16/2018 1618   RDW 12.5 01/16/2018 1618   RDW 13.1 10/08/2017 0941   LYMPHSABS 1.8  01/16/2018 1618   LYMPHSABS 1.6 10/08/2017 0941   MONOABS 0.5 01/16/2018 1618   EOSABS 0.1 01/16/2018 1618   EOSABS 0.1 10/08/2017 0941   BASOSABS 0.0 01/16/2018 1618   BASOSABS 0.0 10/08/2017 0941   Iron/TIBC/Ferritin/ %Sat No results found for: IRON, TIBC, FERRITIN, IRONPCTSAT Lipid Panel     Component Value Date/Time   CHOL 183 10/08/2017 0941   TRIG 62 10/08/2017 0941   HDL 72 10/08/2017 0941   CHOLHDL 2.5 10/08/2017 0941   LDLCALC 99 10/08/2017 0941   Hepatic Function Panel     Component Value Date/Time   PROT 7.1 02/03/2018 1103   ALBUMIN 4.3 02/03/2018 1103   AST 22 02/03/2018 1103   ALT 18 02/03/2018 1103   ALKPHOS 57 02/03/2018 1103   BILITOT 0.7 02/03/2018 1103      Component Value Date/Time   TSH 0.789 10/08/2017 0941    Ref. Range 02/03/2018 11:03  Vitamin D, 25-Hydroxy Latest Ref Range: 30.0 - 100.0 ng/mL 31.3     OBESITY BEHAVIORAL INTERVENTION VISIT  Today's visit was # 8   Starting weight: 234 lbs Starting date: 10/08/2017 Today's weight :237 lbs Today's date: 02/18/2018 Total lbs lost to date: 0   ASK: We discussed the diagnosis of obesity with Odelia Gage today and Royale agreed to give [name] permission to discuss obesity behavioral modification therapy today.  ASSESS: Saamiya has the diagnosis of obesity and her BMI today is 37.11 Talibah is in the action stage of change   ADVISE: Mikelle was educated on the multiple health risks of obesity as well as the benefit of weight loss to improve her health. She was advised of the need for long term treatment and the importance of lifestyle modifications to improve her current health and to decrease her risk of future health problems.  AGREE: Multiple dietary modification options and treatment options were discussed and  Markel agreed to follow the recommendations documented in the above note.  ARRANGE: Crista was educated on the importance of frequent visits to  treat obesity as outlined per CMS and  USPSTF guidelines and agreed to schedule her next follow up appointment today.  I, Tammy Wysor, am acting as Location manager for [employer].  I have reviewed the above documentation for accuracy and completeness, and I agree with the above.  -  , FNP-C.

## 2018-03-05 ENCOUNTER — Ambulatory Visit (INDEPENDENT_AMBULATORY_CARE_PROVIDER_SITE_OTHER): Payer: 59 | Admitting: Family Medicine

## 2018-03-17 ENCOUNTER — Encounter (INDEPENDENT_AMBULATORY_CARE_PROVIDER_SITE_OTHER): Payer: Self-pay | Admitting: Family Medicine

## 2018-03-17 ENCOUNTER — Ambulatory Visit (INDEPENDENT_AMBULATORY_CARE_PROVIDER_SITE_OTHER): Payer: 59 | Admitting: Family Medicine

## 2018-03-17 VITALS — BP 112/79 | HR 83 | Temp 98.9°F | Ht 67.0 in | Wt 236.0 lb

## 2018-03-17 DIAGNOSIS — E559 Vitamin D deficiency, unspecified: Secondary | ICD-10-CM | POA: Diagnosis not present

## 2018-03-17 DIAGNOSIS — G4709 Other insomnia: Secondary | ICD-10-CM | POA: Diagnosis not present

## 2018-03-17 DIAGNOSIS — Z9189 Other specified personal risk factors, not elsewhere classified: Secondary | ICD-10-CM

## 2018-03-17 DIAGNOSIS — Z6836 Body mass index (BMI) 36.0-36.9, adult: Secondary | ICD-10-CM | POA: Diagnosis not present

## 2018-03-17 MED ORDER — VITAMIN D (ERGOCALCIFEROL) 1.25 MG (50000 UNIT) PO CAPS: 50000.0000 [IU] | ORAL_CAPSULE | ORAL | 0 refills | Status: DC

## 2018-03-17 NOTE — Progress Notes (Signed)
Office: [phone]  /  Fax: [phone]   HPI:   Chief Complaint: OBESITY Allison Valenzuela is here to discuss her progress with her obesity treatment plan. She is on the keep a food journal with 1200 calories and 75 grams of protein daily and is following her eating plan approximately 60 % of the time. She states she is exercising 0 minutes 0 times per week. Allison Valenzuela is skipping meals and not prepping meals secondary to fatigue from lack of sleep. She is trying to be mindful of protein intake.  Her weight is 236 lb (107 kg) today and has had a weight loss of 1 pound over a period of 4 weeks since her last visit. She has lost 0 lbs since starting treatment with [name].  Insomnia Allison Valenzuela has had insomnia for a few years. She skips meals and does not meal prep due to fatigue. She states melatonin and benadryl do not help. She denies snoring. She is able to go to sleep but has trouble staying asleep. She has tried all sleep hygiene methods.  Vitamin D Deficiency Allison Valenzuela has a diagnosis of vitamin D deficiency. She is currently taking prescription Vit D, but level is not at goal. Vit D level has increase very little despite taking once weekly Vit D. She denies nausea, vomiting or muscle weakness.  At risk for cardiovascular disease Allison Valenzuela is at a higher than average risk for cardiovascular disease due to obesity and lack of restorative sleep. She currently denies any chest pain.  ASSESSMENT AND PLAN:  Other insomnia  Vitamin D deficiency - Plan: Vitamin D, Ergocalciferol, (DRISDOL) 1.25 MG (50000 UT) CAPS capsule  At risk for heart disease  Class 2 severe obesity with serious comorbidity and body mass index (BMI) of 36.0 to 36.9 in adult, unspecified obesity type ([place])  PLAN:  Insomnia The problem of recurrent insomnia was discussed. Avoidance of caffeine sources was strongly encouraged and sleep hygiene issues were reviewed. Allison Valenzuela will make an appointment with her primary care physician this month to  discuss sleep insomnia. Allison Valenzuela agrees to follow up with our clinic in 3 weeks.  Vitamin D Deficiency Allison Valenzuela was informed that low vitamin D levels contributes to fatigue and are associated with obesity, breast, and colon cancer. Allison Valenzuela agrees to increase prescription Vit D @50 ,000 IU every 3 days #10 with no refills. She will follow up for routine testing of vitamin D, at least 2-3 times per year. She was informed of the risk of over-replacement of vitamin D and agrees to not increase her dose unless she discusses this with [name] first. We will check Vit D level in 2 months. Allison Valenzuela agrees to follow up with our clinic in 3 weeks.  Cardiovascular risk counseling Allison Valenzuela was given extended (15 minutes) coronary artery disease prevention counseling today. She is 38 y.o. female and has risk factors for heart disease including obesity. We discussed intensive lifestyle modifications today with an emphasis on specific weight loss instructions and strategies. Pt was also informed of the importance of increasing exercise and decreasing saturated fats to help prevent heart disease.  Obesity Allison Valenzuela is currently in the action stage of change. As such, her goal is to continue with weight loss efforts She has agreed to keep a food journal with 1200 calories and 75 grams of protein daily Allison Valenzuela has not been prescribed exercise at this time. We discussed the following Behavioral Modification Strategies today: work on meal planning and easy cooking plans, increase H20 intake, no skipping meals, and planning for  success Allison Valenzuela agrees that she will not be successful with our program until she is able to get adequate sleep. Handout on sleep was provided.  Allison Valenzuela has agreed to follow up with our clinic in 3 weeks. She was informed of the importance of frequent follow up visits to maximize her success with intensive lifestyle modifications for her multiple health conditions.  ALLERGIES: Allergies  Allergen Reactions  . Dye Fdc  Blue 1 [Brilliant Blue Fcf]   . Nickel     MEDICATIONS: Current Outpatient Medications on File Prior to Visit  Medication Sig Dispense Refill  . Ascorbic Acid (VITAMIN C) 1000 MG tablet Take 1,000 mg by mouth daily.    . Cyanocobalamin (VITAMIN B 12) 100 MCG LOZG Take by mouth.    . fexofenadine (ALLEGRA) 180 MG tablet Take 180 mg by mouth daily.    . IRON PO Take 65 mg by mouth daily.    [name]  Onychomycosis Nail Lacquer -  Fluconazole 2%, Terbinafine 1% DMSO/UNDECYLENIC ACID 25% Apply to affected nail once daily Qty. 120 gm 3 refills    . Probiotic Product (PROBIOTIC-10 PO) Take 1 capsule by mouth daily.    . pseudoephedrine (SUDAFED) 120 MG 12 hr tablet Take 120 mg by mouth daily.    . rivaroxaban (XARELTO) 20 MG TABS tablet Take 20 mg by mouth daily with supper.     No current facility-administered medications on file prior to visit.     PAST MEDICAL HISTORY: Past Medical History:  Diagnosis Date  . Acid reflux   . Anemia   . Back pain   . Bilateral bunions   . Chest pain   . Constipation   . DVT (deep venous thrombosis) (HCC)    right leg  . Dyspnea   . Gestational diabetes   . HTN (hypertension)   . Lactose intolerance   . Leg edema   . Vitamin D deficiency     PAST SURGICAL HISTORY: Past Surgical History:  Procedure Laterality Date  . ROUX-EN-Y PROCEDURE      SOCIAL HISTORY: Social History   Tobacco Use  . Smoking status: Never Smoker  . Smokeless tobacco: Never Used  Substance Use Topics  . Alcohol use: Yes    Comment: 1-2 a week  . Drug use: Never    FAMILY HISTORY: Family History  Problem Relation Age of Onset  . Hypertension Mother   . Heart disease Mother   . Thyroid disease Mother   . Obesity Mother     ROS: Review of Systems  Constitutional: Positive for malaise/fatigue and weight loss.  Cardiovascular: Negative for chest pain.  Gastrointestinal: Negative for nausea and vomiting.  Musculoskeletal:        Negative muscle weakness  Psychiatric/Behavioral: The patient has insomnia.     PHYSICAL EXAM: Blood pressure 112/79, pulse 83, temperature 98.9 F (37.2 C), height 5\' 7"  (1.702 m), weight 236 lb (107 kg), last menstrual period 02/24/2018, SpO2 99 %. Body mass index is 36.96 kg/m. Physical Exam Vitals signs reviewed.  Constitutional:      Appearance: Normal appearance. She is obese.  Cardiovascular:     Rate and Rhythm: Normal rate.     Pulses: Normal pulses.  Pulmonary:     Effort: Pulmonary effort is normal.     Breath sounds: Normal breath sounds.  Musculoskeletal: Normal range of motion.  Skin:    General: Skin is warm and dry.  Neurological:     Mental Status: She is alert  and oriented to person, place, and time.  Psychiatric:        Mood and Affect: Mood normal.        Behavior: Behavior normal.     RECENT LABS AND TESTS: BMET    Component Value Date/Time   NA 138 02/03/2018 1103   K 4.2 02/03/2018 1103   CL 103 02/03/2018 1103   CO2 22 02/03/2018 1103   GLUCOSE 94 02/03/2018 1103   GLUCOSE 99 01/16/2018 1618   BUN 13 02/03/2018 1103   CREATININE 0.91 02/03/2018 1103   CALCIUM 9.1 02/03/2018 1103   GFRNONAA 80 02/03/2018 1103   GFRAA 92 02/03/2018 1103   Lab Results  Component Value Date   HGBA1C 5.1 02/03/2018   HGBA1C 5.1 10/08/2017   Lab Results  Component Value Date   INSULIN 9.1 02/03/2018   INSULIN 4.3 10/08/2017   CBC    Component Value Date/Time   WBC 4.3 01/16/2018 1618   RBC 3.77 (L) 01/16/2018 1618   HGB 11.1 (L) 01/16/2018 1618   HGB 11.7 10/08/2017 0941   HCT 35.3 (L) 01/16/2018 1618   HCT 36.4 10/08/2017 0941   PLT 259 01/16/2018 1618   PLT 270 10/08/2017 0941   MCV 93.6 01/16/2018 1618   MCV 94 10/08/2017 0941   MCH 29.4 01/16/2018 1618   MCHC 31.4 01/16/2018 1618   RDW 12.5 01/16/2018 1618   RDW 13.1 10/08/2017 0941   LYMPHSABS 1.8 01/16/2018 1618   LYMPHSABS 1.6 10/08/2017 0941   MONOABS 0.5 01/16/2018 1618   EOSABS  0.1 01/16/2018 1618   EOSABS 0.1 10/08/2017 0941   BASOSABS 0.0 01/16/2018 1618   BASOSABS 0.0 10/08/2017 0941   Iron/TIBC/Ferritin/ %Sat No results found for: IRON, TIBC, FERRITIN, IRONPCTSAT Lipid Panel     Component Value Date/Time   CHOL 183 10/08/2017 0941   TRIG 62 10/08/2017 0941   HDL 72 10/08/2017 0941   CHOLHDL 2.5 10/08/2017 0941   LDLCALC 99 10/08/2017 0941   Hepatic Function Panel     Component Value Date/Time   PROT 7.1 02/03/2018 1103   ALBUMIN 4.3 02/03/2018 1103   AST 22 02/03/2018 1103   ALT 18 02/03/2018 1103   ALKPHOS 57 02/03/2018 1103   BILITOT 0.7 02/03/2018 1103      Component Value Date/Time   TSH 0.789 10/08/2017 0941      OBESITY BEHAVIORAL INTERVENTION VISIT  Today's visit was # 9   Starting weight: 234 lbs Starting date: 10/08/17 Today's weight : 236 lbs Today's date: 03/17/2018 Total lbs lost to date: 0    03/17/2018  Height 5\' 7"  (1.702 m)  Weight 236 lb (107 kg)  BMI (Calculated) 36.95  BLOOD PRESSURE - SYSTOLIC 427  BLOOD PRESSURE - DIASTOLIC 79   Body Fat % 06.2 %  Total Body Water (lbs) 91.6 lbs     ASK: We discussed the diagnosis of obesity with Allison Valenzuela today and Allison Valenzuela agreed to give [name] permission to discuss obesity behavioral modification therapy today.  ASSESS: Jamileth has the diagnosis of obesity and her BMI today is 36.95 Mayreli is in the action stage of change   ADVISE: Kelvin was educated on the multiple health risks of obesity as well as the benefit of weight loss to improve her health. She was advised of the need for long term treatment and the importance of lifestyle modifications to improve her current health and to decrease her risk of future health problems.  AGREE: Multiple dietary modification options and treatment options  were discussed and  Raivyn agreed to follow the recommendations documented in the above note.  ARRANGE: Kimiyah was educated on the importance of frequent visits to treat obesity as  outlined per CMS and USPSTF guidelines and agreed to schedule her next follow up appointment today.  Allison Valenzuela, am acting as Location manager for [employer], FNP-C.  I have reviewed the above documentation for accuracy and completeness, and I agree with the above.  - Jemell Town, FNP-C.

## 2018-04-07 ENCOUNTER — Ambulatory Visit (INDEPENDENT_AMBULATORY_CARE_PROVIDER_SITE_OTHER): Payer: 59 | Admitting: Family Medicine

## 2018-04-07 ENCOUNTER — Encounter (INDEPENDENT_AMBULATORY_CARE_PROVIDER_SITE_OTHER): Payer: Self-pay

## 2018-04-10 ENCOUNTER — Encounter (INDEPENDENT_AMBULATORY_CARE_PROVIDER_SITE_OTHER): Payer: Self-pay

## 2018-05-15 ENCOUNTER — Encounter (INDEPENDENT_AMBULATORY_CARE_PROVIDER_SITE_OTHER): Payer: Self-pay | Admitting: Family Medicine

## 2018-05-15 NOTE — Telephone Encounter (Signed)
Please advise 

## 2018-05-20 ENCOUNTER — Ambulatory Visit (INDEPENDENT_AMBULATORY_CARE_PROVIDER_SITE_OTHER): Payer: 59 | Admitting: Family Medicine

## 2018-05-20 ENCOUNTER — Encounter (INDEPENDENT_AMBULATORY_CARE_PROVIDER_SITE_OTHER): Payer: Self-pay | Admitting: Family Medicine

## 2018-05-20 ENCOUNTER — Other Ambulatory Visit: Payer: Self-pay

## 2018-05-20 DIAGNOSIS — E559 Vitamin D deficiency, unspecified: Secondary | ICD-10-CM

## 2018-05-20 DIAGNOSIS — Z6836 Body mass index (BMI) 36.0-36.9, adult: Secondary | ICD-10-CM | POA: Diagnosis not present

## 2018-05-20 MED ORDER — VITAMIN D (ERGOCALCIFEROL) 1.25 MG (50000 UNIT) PO CAPS: 50000.0000 [IU] | ORAL_CAPSULE | ORAL | 0 refills | Status: DC

## 2018-05-20 NOTE — Progress Notes (Signed)
Office: [phone]  /  Fax: [phone] TeleHealth Visit:  Allison Valenzuela has verbally consented to this TeleHealth visit today. The patient is located at home, the provider is located at the [facility] office. The participants in this visit include the listed provider and patient. The visit was conducted today via Face Time.  HPI:   Chief Complaint: OBESITY Allison Valenzuela is here to discuss her progress with her obesity treatment plan. She is keeping a food journal with 1200 calories and 75 grams of protein and is eating fruits and veggies. She states she is walking 30 minutes 3 times per week. Allison Valenzuela feels that she is still doing well with weight loss. She is not following a structured plan, but is working on portion control and Run[employer], broadcasting/film/video. She is doing well avoiding snacking. [name] walks the dogs at least once per day for 15 to 20 minutes.  We were unable to weigh the patient today for this TeleHealth visit. She feels as if she has lost weight since her last visit. She has lost 0 lbs since starting treatment with [name].  Vitamin D Deficiency Allison Valenzuela has a diagnosis of vitamin D deficiency. She is currently stable on vit D, but is not yet at goal. Allison Valenzuela denies nausea, vomiting, or muscle weakness.  ASSESSMENT AND PLAN:  Class 2 severe obesity with serious comorbidity and body mass index (BMI) of 36.0 to 36.9 in adult, unspecified obesity type (HCC)  Vitamin D deficiency - Plan: Vitamin D, Ergocalciferol, (DRISDOL) 1.25 MG (50000 UT) CAPS capsule  PLAN:  Vitamin D Deficiency Allison Valenzuela was informed that low vitamin D levels contribute to fatigue and are associated with obesity, breast, and colon cancer. Allison Valenzuela agrees to continue to take prescription Vit D @50 ,000 IU every 3 days #10 with no refills and will follow up for routine testing of vitamin D, at least 2-3 times per year. She was informed of the risk of over-replacement of vitamin D and agrees to not increase her dose unless  she discusses this with [name] first. Allison Valenzuela agrees to follow up in 2 weeks as directed.  Obesity Allison Valenzuela is currently in the action stage of change. As such, her goal is to continue with weight loss efforts. She has agreed to portion control better and make smarter food choices, such as increase vegetables, and decrease simple carbohydrates.  Allison Valenzuela has been instructed to work up to a goal of 150 minutes of combined cardio and strengthening exercise per week for weight loss and overall health benefits. We discussed the following Behavioral Modification Strategies today: increasing lean protein intake, increasing vegetables, work on meal planning and easy cooking plans, keeping healthy foods in the home, and dealing with family or coworker sabotage.  Allison Valenzuela has agreed to follow up with our clinic in 2 weeks. She was informed of the importance of frequent follow up visits to maximize her success with intensive lifestyle modifications for her multiple health conditions.  ALLERGIES: Allergies  Allergen Reactions  . Dye Fdc Blue 1 [Brilliant Blue Fcf]   . Nickel     MEDICATIONS: Current Outpatient Medications on File Prior to Visit  Medication Sig Dispense Refill  . Ascorbic Acid (VITAMIN C) 1000 MG tablet Take 1,000 mg by mouth daily.    . Cyanocobalamin (VITAMIN B 12) 100 MCG LOZG Take by mouth.    . fexofenadine (ALLEGRA) 180 MG tablet Take 180 mg by mouth daily.    . IRON PO Take 65 mg by mouth daily.    Marland Kitchen  NON [facility]  Onychomycosis Nail Lacquer -  Fluconazole 2%, Terbinafine 1% DMSO/UNDECYLENIC ACID 25% Apply to affected nail once daily Qty. 120 gm 3 refills    . Probiotic Product (PROBIOTIC-10 PO) Take 1 capsule by mouth daily.    . pseudoephedrine (SUDAFED) 120 MG 12 hr tablet Take 120 mg by mouth daily.    . rivaroxaban (XARELTO) 20 MG TABS tablet Take 20 mg by mouth daily with supper.     No current facility-administered medications on file prior to visit.      PAST MEDICAL HISTORY: Past Medical History:  Diagnosis Date  . Acid reflux   . Anemia   . Back pain   . Bilateral bunions   . Chest pain   . Constipation   . DVT (deep venous thrombosis) (HCC)    right leg  . Dyspnea   . Gestational diabetes   . HTN (hypertension)   . Lactose intolerance   . Leg edema   . Vitamin D deficiency     PAST SURGICAL HISTORY: Past Surgical History:  Procedure Laterality Date  . ROUX-EN-Y PROCEDURE      SOCIAL HISTORY: Social History   Tobacco Use  . Smoking status: Never Smoker  . Smokeless tobacco: Never Used  Substance Use Topics  . Alcohol use: Yes    Comment: 1-2 a week  . Drug use: Never    FAMILY HISTORY: Family History  Problem Relation Age of Onset  . Hypertension Mother   . Heart disease Mother   . Thyroid disease Mother   . Obesity Mother     ROS: Review of Systems  Gastrointestinal: Negative for nausea and vomiting.  Musculoskeletal:       Negative for muscle weakness.    PHYSICAL EXAM: Pt in no acute distress  RECENT LABS AND TESTS: BMET    Component Value Date/Time   NA 138 02/03/2018 1103   K 4.2 02/03/2018 1103   CL 103 02/03/2018 1103   CO2 22 02/03/2018 1103   GLUCOSE 94 02/03/2018 1103   GLUCOSE 99 01/16/2018 1618   BUN 13 02/03/2018 1103   CREATININE 0.91 02/03/2018 1103   CALCIUM 9.1 02/03/2018 1103   GFRNONAA 80 02/03/2018 1103   GFRAA 92 02/03/2018 1103   Lab Results  Component Value Date   HGBA1C 5.1 02/03/2018   HGBA1C 5.1 10/08/2017   Lab Results  Component Value Date   INSULIN 9.1 02/03/2018   INSULIN 4.3 10/08/2017   CBC    Component Value Date/Time   WBC 4.3 01/16/2018 1618   RBC 3.77 (L) 01/16/2018 1618   HGB 11.1 (L) 01/16/2018 1618   HGB 11.7 10/08/2017 0941   HCT 35.3 (L) 01/16/2018 1618   HCT 36.4 10/08/2017 0941   PLT 259 01/16/2018 1618   PLT 270 10/08/2017 0941   MCV 93.6 01/16/2018 1618   MCV 94 10/08/2017 0941   MCH 29.4 01/16/2018 1618   MCHC 31.4  01/16/2018 1618   RDW 12.5 01/16/2018 1618   RDW 13.1 10/08/2017 0941   LYMPHSABS 1.8 01/16/2018 1618   LYMPHSABS 1.6 10/08/2017 0941   MONOABS 0.5 01/16/2018 1618   EOSABS 0.1 01/16/2018 1618   EOSABS 0.1 10/08/2017 0941   BASOSABS 0.0 01/16/2018 1618   BASOSABS 0.0 10/08/2017 0941   Iron/TIBC/Ferritin/ %Sat No results found for: IRON, TIBC, FERRITIN, IRONPCTSAT Lipid Panel     Component Value Date/Time   CHOL 183 10/08/2017 0941   TRIG 62 10/08/2017 0941   HDL 72 10/08/2017 0941  CHOLHDL 2.5 10/08/2017 0941   LDLCALC 99 10/08/2017 0941   Hepatic Function Panel     Component Value Date/Time   PROT 7.1 02/03/2018 1103   ALBUMIN 4.3 02/03/2018 1103   AST 22 02/03/2018 1103   ALT 18 02/03/2018 1103   ALKPHOS 57 02/03/2018 1103   BILITOT 0.7 02/03/2018 1103      Component Value Date/Time   TSH 0.789 10/08/2017 0941   Results for KEYA, WYNES (MRN [id number]) as of 05/20/2018 15:07  Ref. Range 02/03/2018 11:03  Vitamin D, 25-Hydroxy Latest Ref Range: 30.0 - 100.0 ng/mL 31.3     I, Marcille Blanco, CMA, am acting as transcriptionist for Starlyn Skeans, MD I have reviewed the above documentation for accuracy and completeness, and I agree with the above. -Dennard Nip, MD

## 2018-06-10 ENCOUNTER — Other Ambulatory Visit (INDEPENDENT_AMBULATORY_CARE_PROVIDER_SITE_OTHER): Payer: Self-pay | Admitting: Family Medicine

## 2018-06-10 DIAGNOSIS — E559 Vitamin D deficiency, unspecified: Secondary | ICD-10-CM

## 2018-06-28 ENCOUNTER — Other Ambulatory Visit (INDEPENDENT_AMBULATORY_CARE_PROVIDER_SITE_OTHER): Payer: Self-pay | Admitting: Family Medicine

## 2018-06-28 DIAGNOSIS — E559 Vitamin D deficiency, unspecified: Secondary | ICD-10-CM

## 2018-07-07 ENCOUNTER — Other Ambulatory Visit: Payer: Self-pay

## 2018-07-07 ENCOUNTER — Encounter (INDEPENDENT_AMBULATORY_CARE_PROVIDER_SITE_OTHER): Payer: Self-pay | Admitting: Physician Assistant

## 2018-07-07 ENCOUNTER — Telehealth (INDEPENDENT_AMBULATORY_CARE_PROVIDER_SITE_OTHER): Payer: 59 | Admitting: Physician Assistant

## 2018-07-07 DIAGNOSIS — E8881 Metabolic syndrome: Secondary | ICD-10-CM | POA: Diagnosis not present

## 2018-07-07 DIAGNOSIS — E559 Vitamin D deficiency, unspecified: Secondary | ICD-10-CM | POA: Diagnosis not present

## 2018-07-07 DIAGNOSIS — Z6836 Body mass index (BMI) 36.0-36.9, adult: Secondary | ICD-10-CM

## 2018-07-09 NOTE — Progress Notes (Signed)
Office: [phone]  /  Fax: [phone] TeleHealth Visit:  Allison Valenzuela has verbally consented to this TeleHealth visit today. The patient is located at home, the provider is located at the [employer] [facility] office. The participants in this visit include the listed provider and patient. The visit was conducted today via FaceTime.  HPI:   Chief Complaint: OBESITY Allison Valenzuela is here to discuss her progress with her obesity treatment plan. She is on the Category 2 plan and is following her eating plan approximately 90% of the time. She states she is walking 60 minutes 4 times per week. Metta reports doing well on the plan. She is keeping track of her protein on My Fitness Pal and trying to keep her calories between 1200-1600. She states her hunger is controlled. We were unable to weigh the patient today for this TeleHealth visit. She feels as if she has lost 5-6 lbs since her last visit. She has lost 0 lbs since starting treatment with [name].  Vitamin D deficiency Allison Valenzuela has a diagnosis of Vitamin D deficiency. She is currently taking prescription Vit D and denies nausea, vomiting or muscle weakness.  Insulin Resistance Allison Valenzuela has a diagnosis of insulin resistance based on her elevated fasting insulin level >5. Although Allison Valenzuela's blood glucose readings are still under good control, insulin resistance puts her at greater risk of metabolic syndrome and diabetes. She is on no medication currently and continues to work on diet and exercise to decrease risk of diabetes. No polyphagia.  ASSESSMENT AND PLAN:  No diagnosis found.  PLAN:  Vitamin D Deficiency Oral was informed that low Vitamin D levels contributes to fatigue and are associated with obesity, breast, and colon cancer. She agrees to continue to take prescription Vit D @ 50,000 IU every three days #10 with 0 refills and will follow-up for routine testing of Vitamin D, at least 2-3 times per year. She was informed of the risk of  over-replacement of Vitamin D and agrees to not increase her dose unless she discusses this with [name] first. Allison Valenzuela agrees to follow-up with our clinic in 2 weeks.  Insulin Resistance Allison Valenzuela will continue to work on weight loss, exercise, and decreasing simple carbohydrates in her diet to help decrease the risk of diabetes. We dicussed metformin including benefits and risks. She was informed that eating too many simple carbohydrates or too many calories at one sitting increases the likelihood of GI side effects. Allison Valenzuela will continue with weight loss and will follow-up with [name] as directed to monitor her progress.  Obesity Allison Valenzuela is currently in the action stage of change. As such, her goal is to continue with weight loss efforts. She has agreed to follow the Category 2 plan or journal 1200 calories + 75 grams of protein. Allison Valenzuela has been instructed to work up to a goal of 150 minutes of combined cardio and strengthening exercise per week for weight loss and overall health benefits. We discussed the following Behavioral Modification Strategies today: work on meal planning, easy cooking plans, and keeping healthy foods in the home.  Allison Valenzuela has agreed to follow-up with our clinic in 2 weeks. She was informed of the importance of frequent follow-up visits to maximize her success with intensive lifestyle modifications for her multiple health conditions.  ALLERGIES: Allergies  Allergen Reactions   Dye Fdc Blue 1 [Brilliant Blue Fcf]    Nickel     MEDICATIONS: Current Outpatient Medications on File Prior to Visit  Medication Sig Dispense Refill  Ascorbic Acid (VITAMIN C) 1000 MG tablet Take 1,000 mg by mouth daily.     Cyanocobalamin (VITAMIN B 12) 100 MCG LOZG Take by mouth.     fexofenadine (ALLEGRA) 180 MG tablet Take 180 mg by mouth daily.     IRON PO Take 65 mg by mouth daily.     NON FORMULARY [facility]  Onychomycosis Nail Lacquer -  Fluconazole 2%, Terbinafine 1% DMSO/UNDECYLENIC  ACID 25% Apply to affected nail once daily Qty. 120 gm 3 refills     Probiotic Product (PROBIOTIC-10 PO) Take 1 capsule by mouth daily.     pseudoephedrine (SUDAFED) 120 MG 12 hr tablet Take 120 mg by mouth daily.     rivaroxaban (XARELTO) 20 MG TABS tablet Take 20 mg by mouth daily with supper.     Vitamin D, Ergocalciferol, (DRISDOL) 1.25 MG (50000 UT) CAPS capsule Take 1 capsule (50,000 Units total) by mouth every 3 (three) days. 10 capsule 0   No current facility-administered medications on file prior to visit.     PAST MEDICAL HISTORY: Past Medical History:  Diagnosis Date   Acid reflux    Anemia    Back pain    Bilateral bunions    Chest pain    Constipation    DVT (deep venous thrombosis) (HCC)    right leg   Dyspnea    Gestational diabetes    HTN (hypertension)    Lactose intolerance    Leg edema    Vitamin D deficiency     PAST SURGICAL HISTORY: Past Surgical History:  Procedure Laterality Date   ROUX-EN-Y PROCEDURE      SOCIAL HISTORY: Social History   Tobacco Use   Smoking status: Never Smoker   Smokeless tobacco: Never Used  Substance Use Topics   Alcohol use: Yes    Comment: 1-2 a week   Drug use: Never    FAMILY HISTORY: Family History  Problem Relation Age of Onset   Hypertension Mother    Heart disease Mother    Thyroid disease Mother    Obesity Mother    ROS: Review of Systems  Gastrointestinal: Negative for nausea and vomiting.  Musculoskeletal:       Negative for muscle weakness.  Endo/Heme/Allergies:       Negative for polyphagia.   PHYSICAL EXAM: Pt in no acute distress  RECENT LABS AND TESTS: BMET    Component Value Date/Time   NA 138 02/03/2018 1103   K 4.2 02/03/2018 1103   CL 103 02/03/2018 1103   CO2 22 02/03/2018 1103   GLUCOSE 94 02/03/2018 1103   GLUCOSE 99 01/16/2018 1618   BUN 13 02/03/2018 1103   CREATININE 0.91 02/03/2018 1103   CALCIUM 9.1 02/03/2018 1103   GFRNONAA 80  02/03/2018 1103   GFRAA 92 02/03/2018 1103   Lab Results  Component Value Date   HGBA1C 5.1 02/03/2018   HGBA1C 5.1 10/08/2017   Lab Results  Component Value Date   INSULIN 9.1 02/03/2018   INSULIN 4.3 10/08/2017   CBC    Component Value Date/Time   WBC 4.3 01/16/2018 1618   RBC 3.77 (L) 01/16/2018 1618   HGB 11.1 (L) 01/16/2018 1618   HGB 11.7 10/08/2017 0941   HCT 35.3 (L) 01/16/2018 1618   HCT 36.4 10/08/2017 0941   PLT 259 01/16/2018 1618   PLT 270 10/08/2017 0941   MCV 93.6 01/16/2018 1618   MCV 94 10/08/2017 0941   MCH 29.4 01/16/2018 1618   MCHC 31.4 01/16/2018  1618   RDW 12.5 01/16/2018 1618   RDW 13.1 10/08/2017 0941   LYMPHSABS 1.8 01/16/2018 1618   LYMPHSABS 1.6 10/08/2017 0941   MONOABS 0.5 01/16/2018 1618   EOSABS 0.1 01/16/2018 1618   EOSABS 0.1 10/08/2017 0941   BASOSABS 0.0 01/16/2018 1618   BASOSABS 0.0 10/08/2017 0941   Iron/TIBC/Ferritin/ %Sat No results found for: IRON, TIBC, FERRITIN, IRONPCTSAT Lipid Panel     Component Value Date/Time   CHOL 183 10/08/2017 0941   TRIG 62 10/08/2017 0941   HDL 72 10/08/2017 0941   CHOLHDL 2.5 10/08/2017 0941   LDLCALC 99 10/08/2017 0941   Hepatic Function Panel     Component Value Date/Time   PROT 7.1 02/03/2018 1103   ALBUMIN 4.3 02/03/2018 1103   AST 22 02/03/2018 1103   ALT 18 02/03/2018 1103   ALKPHOS 57 02/03/2018 1103   BILITOT 0.7 02/03/2018 1103      Component Value Date/Time   TSH 0.789 10/08/2017 0941   Results for NIYANNA, ASCH (MRN [id number]) as of 07/09/2018 16:49  Ref. Range 02/03/2018 11:03  Vitamin D, 25-Hydroxy Latest Ref Range: 30.0 - 100.0 ng/mL 31.3    I, Michaelene Song, am acting as Location manager for [employer], PA-C I, Abby Potash, PA-C have reviewed above note and agree with its content

## 2018-07-10 MED ORDER — VITAMIN D (ERGOCALCIFEROL) 1.25 MG (50000 UNIT) PO CAPS: 50000.0000 [IU] | ORAL_CAPSULE | ORAL | 0 refills | Status: DC

## 2018-07-22 ENCOUNTER — Ambulatory Visit (INDEPENDENT_AMBULATORY_CARE_PROVIDER_SITE_OTHER): Payer: 59 | Admitting: Physician Assistant

## 2018-07-22 ENCOUNTER — Other Ambulatory Visit: Payer: Self-pay

## 2018-07-22 ENCOUNTER — Encounter (INDEPENDENT_AMBULATORY_CARE_PROVIDER_SITE_OTHER): Payer: Self-pay | Admitting: Physician Assistant

## 2018-07-22 DIAGNOSIS — Z6836 Body mass index (BMI) 36.0-36.9, adult: Secondary | ICD-10-CM

## 2018-07-22 DIAGNOSIS — E559 Vitamin D deficiency, unspecified: Secondary | ICD-10-CM

## 2018-07-23 NOTE — Progress Notes (Signed)
Office: [phone]  /  Fax: [phone] TeleHealth Visit:  Allison Valenzuela has verbally consented to this TeleHealth visit today. The patient is located at home, the provider is located at the [employer] [facility] office. The participants in this visit include the listed provider and patient. The visit was conducted today via FaceTime.  HPI:   Chief Complaint: OBESITY [name] is here to discuss her progress with her obesity treatment plan. She is keeping a food journal with 1400 calories and 75-85 grams of protein and is following her eating plan approximately 90% of the time. She states she is exercising 0 minutes 0 times per week. [name] reports that she has been more tired and thinks that her anemia has returned. She is not eating a lot of meat but is getting protein from other sources and reports meeting her protein goal daily. We were unable to weigh the patient today for this TeleHealth visit. She feels as if she has maintained her weight since her last visit. She has lost 0 lbs since starting treatment with [name].  Vitamin D deficiency [name] has a diagnosis of Vitamin D deficiency. She is currently taking prescription Vit D twice weekly and denies nausea, vomiting or muscle weakness.  ASSESSMENT AND PLAN:  Vitamin D deficiency  Class 2 severe obesity with serious comorbidity and body mass index (BMI) of 36.0 to 36.9 in adult, unspecified obesity type ([place])  PLAN:  Vitamin D Deficiency [name] was informed that low Vitamin D levels contributes to fatigue and are associated with obesity, breast, and colon cancer. She agrees to continue taking prescription Vit D twice weekly and will follow-up for routine testing of Vitamin D, at least 2-3 times per year. She was informed of the risk of over-replacement of Vitamin D and agrees to not increase her dose unless she discusses this with [name] first. [name] agrees to follow-up with our clinic in 2 weeks.  Obesity [name] is currently in the  action stage of change. As such, her goal is to continue with weight loss efforts. She has agreed to keep a food journal with 1400 calories and 85 grams of protein daily. [name] has been instructed to work up to a goal of 150 minutes of combined cardio and strengthening exercise per week for weight loss and overall health benefits. We discussed the following Behavioral Modification Strategies today: work on meal planning, easy cooking plans, and keeping healthy foods in the home.  [name] has agreed to follow-up with our clinic in 2 weeks. She was informed of the importance of frequent follow-up visits to maximize her success with intensive lifestyle modifications for her multiple health conditions.  ALLERGIES: Allergies  Allergen Reactions   Dye Fdc Blue 1 [Brilliant Blue Fcf]    Nickel     MEDICATIONS: Current Outpatient Medications on File Prior to Visit  Medication Sig Dispense Refill   Ascorbic Acid (VITAMIN C) 1000 MG tablet Take 1,000 mg by mouth daily.     Cyanocobalamin (VITAMIN B 12) 100 MCG LOZG Take by mouth.     fexofenadine (ALLEGRA) 180 MG tablet Take 180 mg by mouth daily.     IRON PO Take 65 mg by mouth daily.     NON FORMULARY [facility]  Onychomycosis Nail Lacquer -  Fluconazole 2%, Terbinafine 1% DMSO/UNDECYLENIC ACID 25% Apply to affected nail once daily Qty. 120 gm 3 refills     Probiotic Product (PROBIOTIC-10 PO) Take 1 capsule by mouth daily.     pseudoephedrine (SUDAFED) 120  MG 12 hr tablet Take 120 mg by mouth daily.     rivaroxaban (XARELTO) 20 MG TABS tablet Take 20 mg by mouth daily with supper.     Vitamin D, Ergocalciferol, (DRISDOL) 1.25 MG (50000 UT) CAPS capsule Take 1 capsule (50,000 Units total) by mouth every 3 (three) days. 10 capsule 0   No current facility-administered medications on file prior to visit.     PAST MEDICAL HISTORY: Past Medical History:  Diagnosis Date   Acid reflux    Anemia    Back pain    Bilateral  bunions    Chest pain    Constipation    DVT (deep venous thrombosis) (HCC)    right leg   Dyspnea    Gestational diabetes    HTN (hypertension)    Lactose intolerance    Leg edema    Vitamin D deficiency     PAST SURGICAL HISTORY: Past Surgical History:  Procedure Laterality Date   ROUX-EN-Y PROCEDURE      SOCIAL HISTORY: Social History   Tobacco Use   Smoking status: Never Smoker   Smokeless tobacco: Never Used  Substance Use Topics   Alcohol use: Yes    Comment: 1-2 a week   Drug use: Never    FAMILY HISTORY: Family History  Problem Relation Age of Onset   Hypertension Mother    Heart disease Mother    Thyroid disease Mother    Obesity Mother    ROS: Review of Systems  Gastrointestinal: Negative for nausea and vomiting.  Musculoskeletal:       Negative for muscle weakness.   PHYSICAL EXAM: Pt in no acute distress  RECENT LABS AND TESTS: BMET    Component Value Date/Time   NA 138 02/03/2018 1103   K 4.2 02/03/2018 1103   CL 103 02/03/2018 1103   CO2 22 02/03/2018 1103   GLUCOSE 94 02/03/2018 1103   GLUCOSE 99 01/16/2018 1618   BUN 13 02/03/2018 1103   CREATININE 0.91 02/03/2018 1103   CALCIUM 9.1 02/03/2018 1103   GFRNONAA 80 02/03/2018 1103   GFRAA 92 02/03/2018 1103   Lab Results  Component Value Date   HGBA1C 5.1 02/03/2018   HGBA1C 5.1 10/08/2017   Lab Results  Component Value Date   INSULIN 9.1 02/03/2018   INSULIN 4.3 10/08/2017   CBC    Component Value Date/Time   WBC 4.3 01/16/2018 1618   RBC 3.77 (L) 01/16/2018 1618   HGB 11.1 (L) 01/16/2018 1618   HGB 11.7 10/08/2017 0941   HCT 35.3 (L) 01/16/2018 1618   HCT 36.4 10/08/2017 0941   PLT 259 01/16/2018 1618   PLT 270 10/08/2017 0941   MCV 93.6 01/16/2018 1618   MCV 94 10/08/2017 0941   MCH 29.4 01/16/2018 1618   MCHC 31.4 01/16/2018 1618   RDW 12.5 01/16/2018 1618   RDW 13.1 10/08/2017 0941   LYMPHSABS 1.8 01/16/2018 1618   LYMPHSABS 1.6 10/08/2017  0941   MONOABS 0.5 01/16/2018 1618   EOSABS 0.1 01/16/2018 1618   EOSABS 0.1 10/08/2017 0941   BASOSABS 0.0 01/16/2018 1618   BASOSABS 0.0 10/08/2017 0941   Iron/TIBC/Ferritin/ %Sat No results found for: IRON, TIBC, FERRITIN, IRONPCTSAT Lipid Panel     Component Value Date/Time   CHOL 183 10/08/2017 0941   TRIG 62 10/08/2017 0941   HDL 72 10/08/2017 0941   CHOLHDL 2.5 10/08/2017 0941   LDLCALC 99 10/08/2017 0941   Hepatic Function Panel     Component Value Date/Time  PROT 7.1 02/03/2018 1103   ALBUMIN 4.3 02/03/2018 1103   AST 22 02/03/2018 1103   ALT 18 02/03/2018 1103   ALKPHOS 57 02/03/2018 1103   BILITOT 0.7 02/03/2018 1103      Component Value Date/Time   TSH 0.789 10/08/2017 0941   Results for KARENA, KINKER (MRN [id number]) as of 07/23/2018 09:08  Ref. Range 02/03/2018 11:03  Vitamin D, 25-Hydroxy Latest Ref Range: 30.0 - 100.0 ng/mL 31.3   I, Michaelene Song, am acting as Location manager for [employer], PA-C I, Abby Potash, PA-C have reviewed above note and agree with its content

## 2018-08-04 ENCOUNTER — Other Ambulatory Visit (INDEPENDENT_AMBULATORY_CARE_PROVIDER_SITE_OTHER): Payer: Self-pay | Admitting: Physician Assistant

## 2018-08-04 DIAGNOSIS — E559 Vitamin D deficiency, unspecified: Secondary | ICD-10-CM

## 2018-08-06 ENCOUNTER — Ambulatory Visit: Payer: 59 | Admitting: Cardiology

## 2018-08-06 ENCOUNTER — Ambulatory Visit (INDEPENDENT_AMBULATORY_CARE_PROVIDER_SITE_OTHER): Payer: 59 | Admitting: Physician Assistant

## 2018-10-16 ENCOUNTER — Other Ambulatory Visit: Payer: Self-pay | Admitting: Obstetrics and Gynecology

## 2018-10-16 DIAGNOSIS — Z1231 Encounter for screening mammogram for malignant neoplasm of breast: Secondary | ICD-10-CM

## 2018-10-27 ENCOUNTER — Ambulatory Visit: Payer: 59 | Attending: Internal Medicine | Admitting: Internal Medicine

## 2018-10-27 ENCOUNTER — Other Ambulatory Visit: Payer: Self-pay

## 2018-10-27 ENCOUNTER — Encounter: Payer: Self-pay | Admitting: Internal Medicine

## 2018-10-27 DIAGNOSIS — E559 Vitamin D deficiency, unspecified: Secondary | ICD-10-CM | POA: Diagnosis not present

## 2018-10-27 DIAGNOSIS — Z86718 Personal history of other venous thrombosis and embolism: Secondary | ICD-10-CM | POA: Insufficient documentation

## 2018-10-27 DIAGNOSIS — D500 Iron deficiency anemia secondary to blood loss (chronic): Secondary | ICD-10-CM

## 2018-10-27 DIAGNOSIS — N920 Excessive and frequent menstruation with regular cycle: Secondary | ICD-10-CM | POA: Insufficient documentation

## 2018-10-27 DIAGNOSIS — E538 Deficiency of other specified B group vitamins: Secondary | ICD-10-CM

## 2018-10-27 DIAGNOSIS — E669 Obesity, unspecified: Secondary | ICD-10-CM | POA: Insufficient documentation

## 2018-10-27 NOTE — Progress Notes (Signed)
Virtual Visit via Telephone Note  I connected with Allison Valenzuela on 10/27/18 at 9:12 a.m by telephone and verified that I am speaking with the correct person using two identifiers. I am in my office.  The patient is at home.  Only the patient and myself participated in this encounter.    I discussed the limitations, risks, security and privacy concerns of performing an evaluation and management service by telephone and the availability of in person appointments. I also discussed with the patient that there may be a patient responsible charge related to this service. The patient expressed understanding and agreed to proceed.   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  Previous PCP was PA at [facility] on [address].  She desires to change because DVT was not caught even when she presented to her provider with swelling in her leg.  DVT right lower extremity: Diagnosed in  July 2019.  Currently on Xarelto.  Seen by hematology at [facility] 05/2018.  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  His plan was to keep her on blood thinner for now and have her follow-up in 6 months for some additional blood testing and decision will be made then whether to keep her on the medication lifelong. -Patient reports heavy menstrual cycles since being on Xarelto.  Menses used to last 5 to 7 days with light bleeding.  Now menses last 7 days with 2 heavy days and the rest of the days she passes clots.  Gives history of iron deficiency anemia in the past.  Endorses dizzy spells and tiredness during her menstrual cycles.  She has started taking iron 65 mg daily over-the-counter.  She has an appointment with her gynecologist in about 2 weeks.  Obesity: Followed by medical weight management.  She had stopped going during the [place] pandemic but plans to call  and get back in. -She restricts herself to 1250 cal/day. She walks about 3-1/2 miles most days.  Just started back in the past 2 weeks.  Current weight is at 230 pounds.  Prior to weight reduction surgery in 2005, weight was 300 pounds.  Gives history of vitamin D deficiency.  She was on high-dose vitamin D twice a week.  Now she is taking 1000 IU about 8 pills/week. Also gives past history of B12 deficiency.  Diagnosed in 2017 when she lived in [place] and was getting B12 shots.  Past medical, social, family history, surgical history reviewed.  I have also reviewed the hematologist note that is on care everywhere  Outpatient Encounter Medications as of 10/27/2018  Medication Sig  . Ascorbic Acid (VITAMIN C) 1000 MG tablet Take 1,000 mg by mouth daily.  . Cyanocobalamin (VITAMIN B 12) 100 MCG LOZG Take by mouth.  . fexofenadine (ALLEGRA) 180 MG tablet Take 180 mg by mouth daily.  . IRON PO Take 65 mg by mouth daily.  [name] FORMUL[facility]  Onychomycosis Nail Lacquer -  Fluconazole 2%, Terbinafine 1% DMSO/UNDECYLENIC ACID 25% Apply to affected nail once daily Qty. 120 gm 3 refills  . Probiotic Product (PROBIOTIC-10 PO) Take 1 capsule by mouth daily.  . pseudoephedrine (SUDAFED) 120 MG 12 hr tablet Take 120 mg by mouth daily.  . rivaroxaban (XARELTO) 20 MG TABS tablet Take 20 mg by mouth daily with supper.  . Vitamin D, Ergocalciferol, (DRISDOL) 1.25 MG (50000 UT)  CAPS capsule Take 1 capsule (50,000 Units total) by mouth every 3 (three) days.   No facility-administered encounter medications on file as of 10/27/2018.    Marland Kitchen    Observations/Objective: No direct observation done as this was a telephone encounter. Results for orders placed or performed in visit on 02/03/18  Comprehensive metabolic panel  Result Value Ref Range   Glucose 94 65 - 99 mg/dL   BUN 13 6 - 20 mg/dL   Creatinine, Ser 0.91 0.57 - 1.00 mg/dL   GFR calc non Af Amer 80 >59 mL/min/1.73   GFR calc Af  Amer 92 >59 mL/min/1.73   BUN/Creatinine Ratio 14 9 - 23   Sodium 138 134 - 144 mmol/L   Potassium 4.2 3.5 - 5.2 mmol/L   Chloride 103 96 - 106 mmol/L   CO2 22 20 - 29 mmol/L   Calcium 9.1 8.7 - 10.2 mg/dL   Total Protein 7.1 6.0 - 8.5 g/dL   Albumin 4.3 3.8 - 4.8 g/dL   Globulin, Total 2.8 1.5 - 4.5 g/dL   Albumin/Globulin Ratio 1.5 1.2 - 2.2   Bilirubin Total 0.7 0.0 - 1.2 mg/dL   Alkaline Phosphatase 57 39 - 117 IU/L   AST 22 0 - 40 IU/L   ALT 18 0 - 32 IU/L  Hemoglobin A1c  Result Value Ref Range   Hgb A1c MFr Bld 5.1 4.8 - 5.6 %   Est. average glucose Bld gHb Est-mCnc 100 mg/dL  Insulin, random  Result Value Ref Range   INSULIN 9.1 2.6 - 24.9 uIU/mL  VITAMIN D 25 Hydroxy (Vit-D Deficiency, Fractures)  Result Value Ref Range   Vit D, 25-Hydroxy 31.3 30.0 - 100.0 ng/mL     Assessment and Plan: 1. Menorrhagia with regular cycle -Likely due to being on DAOG Keep appointment with gynecology this month for further evaluation and management - CBC; Future - Iron, TIBC and Ferritin Panel; Future  2. Iron deficiency anemia due to chronic blood loss Plan to check iron studies  3. History of DVT of lower extremity With no known initiating factor other than obesity.  On Xarelto.  Followed by hematology.  She will keep her follow-up appointment with the hematologist next month  4. Vitamin D deficiency - VITAMIN D 25 Hydroxy (Vit-D Deficiency, Fractures); Future  5. B12 deficiency - Vitamin B12; Future  6. Obesity (BMI 30-39.9) Commended her on her eating habits and regular exercise.  Encouraged her to call the weight management program and get back in   Follow Up Instructions: 6 to 8 weeks  I discussed the assessment and treatment plan with the patient. The patient was provided an opportunity to ask questions and all were answered. The patient agreed with the plan and demonstrated an understanding of the instructions.   The patient was advised to call back or seek an  in-person evaluation if the symptoms worsen or if the condition fails to improve as anticipated.  I provided 17 minutes of non-face-to-face time during this encounter.   Karle Plumber, MD

## 2018-10-29 ENCOUNTER — Ambulatory Visit: Payer: 59 | Attending: Family Medicine

## 2018-10-29 ENCOUNTER — Other Ambulatory Visit: Payer: Self-pay

## 2018-10-29 DIAGNOSIS — N920 Excessive and frequent menstruation with regular cycle: Secondary | ICD-10-CM

## 2018-10-29 DIAGNOSIS — E538 Deficiency of other specified B group vitamins: Secondary | ICD-10-CM

## 2018-10-29 DIAGNOSIS — E559 Vitamin D deficiency, unspecified: Secondary | ICD-10-CM

## 2018-10-30 ENCOUNTER — Other Ambulatory Visit: Payer: Self-pay | Admitting: Internal Medicine

## 2018-10-30 LAB — IRON,TIBC AND FERRITIN PANEL
Ferritin: 7 ng/mL — ABNORMAL LOW (ref 15–150)
Iron Saturation: 4 % — CL (ref 15–55)
Iron: 13 ug/dL — ABNORMAL LOW (ref 27–159)
Total Iron Binding Capacity: 348 ug/dL (ref 250–450)
UIBC: 335 ug/dL (ref 131–425)

## 2018-10-30 LAB — CBC
Hematocrit: 27.4 % — ABNORMAL LOW (ref 34.0–46.6)
Hemoglobin: 8.1 g/dL — ABNORMAL LOW (ref 11.1–15.9)
MCH: 23.5 pg — ABNORMAL LOW (ref 26.6–33.0)
MCHC: 29.6 g/dL — ABNORMAL LOW (ref 31.5–35.7)
MCV: 80 fL (ref 79–97)
Platelets: 364 10*3/uL (ref 150–450)
RBC: 3.44 x10E6/uL — ABNORMAL LOW (ref 3.77–5.28)
RDW: 19.8 % — ABNORMAL HIGH (ref 11.7–15.4)
WBC: 2.9 10*3/uL — ABNORMAL LOW (ref 3.4–10.8)

## 2018-10-30 LAB — VITAMIN B12: Vitamin B-12: 840 pg/mL (ref 232–1245)

## 2018-10-30 LAB — VITAMIN D 25 HYDROXY (VIT D DEFICIENCY, FRACTURES): Vit D, 25-Hydroxy: 43.3 ng/mL (ref 30.0–100.0)

## 2018-10-30 MED ORDER — FERROUS SULFATE 325 (65 FE) MG PO TABS: 325.0000 mg | ORAL_TABLET | Freq: Two times a day (BID) | ORAL | 3 refills | Status: DC

## 2018-10-31 ENCOUNTER — Encounter: Payer: Self-pay | Admitting: Internal Medicine

## 2018-11-03 MED ORDER — POLYETHYLENE GLYCOL 3350 17 GM/SCOOP PO POWD: 17.0000 g | Freq: Every day | ORAL | 1 refills | Status: DC | PRN

## 2018-11-04 ENCOUNTER — Other Ambulatory Visit (HOSPITAL_COMMUNITY)
Admission: RE | Admit: 2018-11-04 | Discharge: 2018-11-04 | Disposition: A | Discharge status: Home / Self Care | Payer: 59 | Source: Ambulatory Visit | Attending: Obstetrics and Gynecology | Admitting: Obstetrics and Gynecology

## 2018-11-04 ENCOUNTER — Other Ambulatory Visit: Payer: Self-pay | Admitting: Obstetrics and Gynecology

## 2018-11-04 DIAGNOSIS — N870 Mild cervical dysplasia: Secondary | ICD-10-CM | POA: Insufficient documentation

## 2018-11-06 LAB — CYTOLOGY - PAP
Comment: NEGATIVE
Diagnosis: NEGATIVE
High risk HPV: NEGATIVE

## 2018-11-12 ENCOUNTER — Encounter (INDEPENDENT_AMBULATORY_CARE_PROVIDER_SITE_OTHER): Payer: Self-pay | Admitting: Family Medicine

## 2018-11-12 ENCOUNTER — Other Ambulatory Visit: Payer: Self-pay

## 2018-11-12 ENCOUNTER — Ambulatory Visit (INDEPENDENT_AMBULATORY_CARE_PROVIDER_SITE_OTHER): Payer: 59 | Admitting: Family Medicine

## 2018-11-12 VITALS — BP 102/67 | HR 77 | Temp 98.1°F | Ht 67.0 in | Wt 234.0 lb

## 2018-11-12 DIAGNOSIS — Z9189 Other specified personal risk factors, not elsewhere classified: Secondary | ICD-10-CM

## 2018-11-12 DIAGNOSIS — E8881 Metabolic syndrome: Secondary | ICD-10-CM

## 2018-11-12 DIAGNOSIS — Z6836 Body mass index (BMI) 36.0-36.9, adult: Secondary | ICD-10-CM

## 2018-11-12 DIAGNOSIS — E559 Vitamin D deficiency, unspecified: Secondary | ICD-10-CM | POA: Diagnosis not present

## 2018-11-12 MED ORDER — VITAMIN D (ERGOCALCIFEROL) 1.25 MG (50000 UNIT) PO CAPS: 50000.0000 [IU] | ORAL_CAPSULE | ORAL | 0 refills | Status: DC

## 2018-11-12 NOTE — Progress Notes (Signed)
Office: [phone]  /  Fax: [phone]   HPI:   Chief Complaint: OBESITY Allison Valenzuela is here to discuss her progress with her obesity treatment plan. She is keeping a food journal with 1200 calories and 75 grams of protein and is following her eating plan approximately 80 to 90 % of the time. She states she is walking 60 minutes 4 times per week. Allison Valenzuela is meeting her protein goals. She does a veggie juice in the morning without protein, but she does get protein in late in the day. Allison Valenzuela just started back to the gym and will add weight training. She is hungry now as she used to struggle to eat before she started on our plan.  Her weight is 234 lb (106.1 kg) today and has had a weight loss of 2 pounds over a period of 7 months since her last visit. She has lost 0 lbs since starting treatment with [name].  Vitamin D Deficiency Allison Valenzuela has a diagnosis of vitamin D deficiency. She is not currently on vit D, but is not quite at goal. Her last vitamin D level was 43.3 on 10/29/18. Allison Valenzuela denies nausea, vomiting, or muscle weakness.  At risk for osteopenia and osteoporosis Allison Valenzuela is at higher risk of osteopenia and osteoporosis due to vitamin D deficiency.   Insulin Resistance Allison Valenzuela has a diagnosis of insulin resistance based on her elevated fasting insulin level >5. Although Allison Valenzuela's blood glucose readings are still under good control, insulin resistance puts her at greater risk of metabolic syndrome and diabetes. She is not taking metformin currently and continues to work on diet and exercise to decrease risk of diabetes. Allison Valenzuela denies polyphagia.  ASSESSMENT AND PLAN:  Vitamin D deficiency - Plan: Vitamin D, Ergocalciferol, (DRISDOL) 1.25 MG (50000 UT) CAPS capsule  Insulin resistance  At risk for osteoporosis  Class 2 severe obesity with serious comorbidity and body mass index (BMI) of 36.0 to 36.9 in adult, unspecified obesity type ([place])  PLAN:  Vitamin D Deficiency Allison Valenzuela was informed that low  vitamin D levels contribute to fatigue and are associated with obesity, breast, and colon cancer. Allison Valenzuela agrees to continue to take prescription Vit D @50 ,000 IU every week #4 with no refills and will follow up for routine testing of vitamin D, at least 2-3 times per year. She was informed of the risk of over-replacement of vitamin D and agrees to not increase her dose unless she discusses this with [name] first. Allison Valenzuela agrees to follow up in 2 to 3 weeks as directed.  At risk for osteopenia and osteoporosis Allison Valenzuela was given extended (15 minutes) osteoporosis prevention counseling today. Allison Valenzuela is at risk for osteopenia and osteoporosis due to her vitamin D deficiency. She was encouraged to take her vitamin D and follow her higher calcium diet and increase strengthening exercise to help strengthen her bones and decrease her risk of osteopenia and osteoporosis.  Insulin Resistance Allison Valenzuela will continue to work on weight loss, exercise, and decreasing simple carbohydrates in her diet to help decrease the risk of diabetes.  Allison Valenzuela agreed to continue her meal plan and to follow up with [name] as directed to monitor her progress.   Obesity Allison Valenzuela is currently in the action stage of change. As such, her goal is to continue with weight loss efforts. She has agreed to keep a food journal with 1200 calories and 75 grams of protein daily.  Allison Valenzuela has been instructed to continue walking 4 times a week and to add resistance 2 times per  week. We discussed the following Behavioral Modification Strategies today: keep a strict food journal and planning for success. Allison Valenzuela was sent the Recipes handout via Mychart.  Allison Valenzuela has agreed to follow up with our clinic in 2 to 3 weeks. She was informed of the importance of frequent follow up visits to maximize her success with intensive lifestyle modifications for her multiple health conditions.  ALLERGIES: Allergies  Allergen Reactions  . Dye Fdc Blue 1 [Brilliant Blue Fcf]   . Nickel      MEDICATIONS: Current Outpatient Medications on File Prior to Visit  Medication Sig Dispense Refill  . Ascorbic Acid (VITAMIN C) 1000 MG tablet Take 1,000 mg by mouth daily.    . Cyanocobalamin (VITAMIN B 12) 100 MCG LOZG Take by mouth.    . ferrous sulfate 325 (65 FE) MG tablet Take 1 tablet (325 mg total) by mouth 2 (two) times daily with a meal. 120 tablet 3  . fexofenadine (ALLEGRA) 180 MG tablet Take 180 mg by mouth daily.    Marland Kitchen ibuprofen (ADVIL) 800 MG tablet Take 800 mg by mouth every 8 (eight) hours.    . IRON PO Take 65 mg by mouth daily.    [name]  Onychomycosis Nail Lacquer -  Fluconazole 2%, Terbinafine 1% DMSO/UNDECYLENIC ACID 25% Apply to affected nail once daily Qty. 120 gm 3 refills    . polyethylene glycol powder (GLYCOLAX/MIRALAX) 17 GM/SCOOP powder Take 17 g by mouth daily as needed for mild constipation. 3350 g 1  . Probiotic Product (PROBIOTIC-10 PO) Take 1 capsule by mouth daily.    . pseudoephedrine (SUDAFED) 120 MG 12 hr tablet Take 120 mg by mouth daily.    . rivaroxaban (XARELTO) 20 MG TABS tablet Take 20 mg by mouth daily with supper.     No current facility-administered medications on file prior to visit.     PAST MEDICAL HISTORY: Past Medical History:  Diagnosis Date  . Acid reflux   . Anemia   . Back pain   . Bilateral bunions   . Chest pain   . Constipation   . DVT (deep venous thrombosis) (HCC)    right leg  . Dyspnea   . Gestational diabetes   . HTN (hypertension)   . Lactose intolerance   . Leg edema   . Vitamin D deficiency     PAST SURGICAL HISTORY: Past Surgical History:  Procedure Laterality Date  . ROUX-EN-Y PROCEDURE      SOCIAL HISTORY: Social History   Tobacco Use  . Smoking status: Never Smoker  . Smokeless tobacco: Never Used  Substance Use Topics  . Alcohol use: Yes    Comment: 1-2 a week  . Drug use: Never    FAMILY HISTORY: Family History  Problem Relation Age of Onset  .  Hypertension Mother   . Heart disease Mother   . Thyroid disease Mother   . Obesity Mother     ROS: Review of Systems  Gastrointestinal: Negative for nausea and vomiting.  Musculoskeletal:       Negative for muscle weakness.  Endo/Heme/Allergies:       Negative for polyphagia.    PHYSICAL EXAM: Blood pressure 102/67, pulse 77, temperature 98.1 F (36.7 C), temperature source Oral, height 5\' 7"  (1.702 m), weight 234 lb (106.1 kg), last menstrual period 10/17/2018, SpO2 99 %. Body mass index is 36.65 kg/m. Physical Exam Vitals signs reviewed.  Constitutional:      Appearance: Normal appearance. She is obese.  Cardiovascular:     Rate and Rhythm: Normal rate.  Pulmonary:     Effort: Pulmonary effort is normal.  Musculoskeletal: Normal range of motion.  Skin:    General: Skin is warm and dry.  Neurological:     Mental Status: She is alert and oriented to person, place, and time.  Psychiatric:        Mood and Affect: Mood normal.        Behavior: Behavior normal.     RECENT LABS AND TESTS: BMET    Component Value Date/Time   NA 138 02/03/2018 1103   K 4.2 02/03/2018 1103   CL 103 02/03/2018 1103   CO2 22 02/03/2018 1103   GLUCOSE 94 02/03/2018 1103   GLUCOSE 99 01/16/2018 1618   BUN 13 02/03/2018 1103   CREATININE 0.91 02/03/2018 1103   CALCIUM 9.1 02/03/2018 1103   GFRNONAA 80 02/03/2018 1103   GFRAA 92 02/03/2018 1103   Lab Results  Component Value Date   HGBA1C 5.1 02/03/2018   HGBA1C 5.1 10/08/2017   Lab Results  Component Value Date   INSULIN 9.1 02/03/2018   INSULIN 4.3 10/08/2017   CBC    Component Value Date/Time   WBC 2.9 (L) 10/29/2018 0846   WBC 4.3 01/16/2018 1618   RBC 3.44 (L) 10/29/2018 0846   RBC 3.77 (L) 01/16/2018 1618   HGB 8.1 (L) 10/29/2018 0846   HCT 27.4 (L) 10/29/2018 0846   PLT 364 10/29/2018 0846   MCV 80 10/29/2018 0846   MCH 23.5 (L) 10/29/2018 0846   MCH 29.4 01/16/2018 1618   MCHC 29.6 (L) 10/29/2018 0846   MCHC  31.4 01/16/2018 1618   RDW 19.8 (H) 10/29/2018 0846   LYMPHSABS 1.8 01/16/2018 1618   LYMPHSABS 1.6 10/08/2017 0941   MONOABS 0.5 01/16/2018 1618   EOSABS 0.1 01/16/2018 1618   EOSABS 0.1 10/08/2017 0941   BASOSABS 0.0 01/16/2018 1618   BASOSABS 0.0 10/08/2017 0941   Iron/TIBC/Ferritin/ %Sat    Component Value Date/Time   IRON 13 (L) 10/29/2018 0846   TIBC 348 10/29/2018 0846   FERRITIN 7 (L) 10/29/2018 0846   IRONPCTSAT 4 (LL) 10/29/2018 0846   Lipid Panel     Component Value Date/Time   CHOL 183 10/08/2017 0941   TRIG 62 10/08/2017 0941   HDL 72 10/08/2017 0941   CHOLHDL 2.5 10/08/2017 0941   LDLCALC 99 10/08/2017 0941   Hepatic Function Panel     Component Value Date/Time   PROT 7.1 02/03/2018 1103   ALBUMIN 4.3 02/03/2018 1103   AST 22 02/03/2018 1103   ALT 18 02/03/2018 1103   ALKPHOS 57 02/03/2018 1103   BILITOT 0.7 02/03/2018 1103      Component Value Date/Time   TSH 0.789 10/08/2017 0941   Results for MOYINOLUWA, ANGELASTRO (MRN [id number]) as of 11/12/2018 08:30  Ref. Range 10/29/2018 08:46  Vitamin D, 25-Hydroxy Latest Ref Range: 30.0 - 100.0 ng/mL 43.3   OBESITY BEHAVIORAL INTERVENTION VISIT  Today's visit was # 13  Starting weight: 234 lbs Starting date: 10/08/2017 Today's weight : Weight: 234 lb (106.1 kg)  Today's date: 11/12/2018 Total lbs lost to date: 0    11/12/2018  Height 5\' 7"  (1.702 m)  Weight 234 lb (106.1 kg)  BMI (Calculated) 36.64  BLOOD PRESSURE - SYSTOLIC [id number]  BLOOD PRESSURE - DIASTOLIC 67   Body Fat % 0000000 %   ASK: We discussed the diagnosis of obesity with Allison Valenzuela today and Allison Valenzuela agreed to  give [name] permission to discuss obesity behavioral modification therapy today.  ASSESS: Allison Valenzuela has the diagnosis of obesity and her BMI today is 36.64. Allison Valenzuela is in the action stage of change.   ADVISE: Allison Valenzuela was educated on the multiple health risks of obesity as well as the benefit of weight loss to improve her health. She was  advised of the need for long term treatment and the importance of lifestyle modifications to improve her current health and to decrease her risk of future health problems.  AGREE: Multiple dietary modification options and treatment options were discussed and Allison Valenzuela agreed to follow the recommendations documented in the above note.  ARRANGE: Allison Valenzuela was educated on the importance of frequent visits to treat obesity as outlined per CMS and USPSTF guidelines and agreed to schedule her next follow up appointment today.  Lenward Chancellor, CMA, am acting as Location manager for [employer], FNP-C  I have reviewed the above documentation for accuracy and completeness, and I agree with the above.  - Chidera Dearcos, FNP-C.

## 2018-11-13 ENCOUNTER — Encounter (INDEPENDENT_AMBULATORY_CARE_PROVIDER_SITE_OTHER): Payer: Self-pay | Admitting: Family Medicine

## 2018-11-26 ENCOUNTER — Ambulatory Visit (INDEPENDENT_AMBULATORY_CARE_PROVIDER_SITE_OTHER): Payer: 59 | Admitting: Family Medicine

## 2018-12-02 ENCOUNTER — Other Ambulatory Visit: Payer: Self-pay

## 2018-12-02 ENCOUNTER — Ambulatory Visit
Admission: RE | Admit: 2018-12-02 | Discharge: 2018-12-02 | Disposition: A | Discharge status: Home / Self Care | Payer: 59 | Source: Ambulatory Visit | Attending: Obstetrics and Gynecology | Admitting: Obstetrics and Gynecology

## 2018-12-02 DIAGNOSIS — Z1231 Encounter for screening mammogram for malignant neoplasm of breast: Secondary | ICD-10-CM

## 2018-12-04 ENCOUNTER — Other Ambulatory Visit: Payer: Self-pay | Admitting: Obstetrics and Gynecology

## 2018-12-04 DIAGNOSIS — R928 Other abnormal and inconclusive findings on diagnostic imaging of breast: Secondary | ICD-10-CM

## 2018-12-08 ENCOUNTER — Ambulatory Visit
Admission: RE | Admit: 2018-12-08 | Discharge: 2018-12-08 | Disposition: A | Discharge status: Home / Self Care | Payer: 59 | Source: Ambulatory Visit | Attending: Obstetrics and Gynecology | Admitting: Obstetrics and Gynecology

## 2018-12-08 ENCOUNTER — Other Ambulatory Visit: Payer: Self-pay

## 2018-12-08 ENCOUNTER — Other Ambulatory Visit: Payer: Self-pay | Admitting: Obstetrics and Gynecology

## 2018-12-08 DIAGNOSIS — N6489 Other specified disorders of breast: Secondary | ICD-10-CM

## 2018-12-08 DIAGNOSIS — R928 Other abnormal and inconclusive findings on diagnostic imaging of breast: Secondary | ICD-10-CM

## 2018-12-15 ENCOUNTER — Other Ambulatory Visit: Payer: Self-pay

## 2018-12-15 ENCOUNTER — Ambulatory Visit: Payer: 59 | Attending: Internal Medicine | Admitting: Internal Medicine

## 2018-12-22 ENCOUNTER — Other Ambulatory Visit: Payer: Self-pay | Admitting: Obstetrics and Gynecology

## 2018-12-24 ENCOUNTER — Ambulatory Visit (INDEPENDENT_AMBULATORY_CARE_PROVIDER_SITE_OTHER): Payer: 59 | Admitting: Family Medicine

## 2018-12-30 ENCOUNTER — Other Ambulatory Visit: Payer: Self-pay

## 2018-12-30 ENCOUNTER — Telehealth (INDEPENDENT_AMBULATORY_CARE_PROVIDER_SITE_OTHER): Payer: 59 | Admitting: Family Medicine

## 2018-12-30 ENCOUNTER — Encounter (INDEPENDENT_AMBULATORY_CARE_PROVIDER_SITE_OTHER): Payer: Self-pay | Admitting: Family Medicine

## 2018-12-30 DIAGNOSIS — Z9189 Other specified personal risk factors, not elsewhere classified: Secondary | ICD-10-CM

## 2018-12-30 DIAGNOSIS — I8290 Acute embolism and thrombosis of unspecified vein: Secondary | ICD-10-CM

## 2018-12-30 DIAGNOSIS — E8881 Metabolic syndrome: Secondary | ICD-10-CM | POA: Diagnosis not present

## 2018-12-30 DIAGNOSIS — E559 Vitamin D deficiency, unspecified: Secondary | ICD-10-CM | POA: Diagnosis not present

## 2018-12-30 DIAGNOSIS — D508 Other iron deficiency anemias: Secondary | ICD-10-CM | POA: Diagnosis not present

## 2018-12-30 DIAGNOSIS — Z6836 Body mass index (BMI) 36.0-36.9, adult: Secondary | ICD-10-CM

## 2018-12-30 MED ORDER — VITAMIN D (ERGOCALCIFEROL) 1.25 MG (50000 UNIT) PO CAPS: 50000.0000 [IU] | ORAL_CAPSULE | ORAL | 0 refills | Status: DC

## 2019-01-01 NOTE — Progress Notes (Signed)
Office: [phone]  /  Fax: [phone] TeleHealth Visit:  [name] has verbally consented to this TeleHealth visit today. The patient is located at home, the provider is located at the [facility] office. The participants in this visit include the listed provider and patient. The visit was conducted today via [email or web address].  HPI:  Chief Complaint: OBESITY Allison Valenzuela is here to discuss her progress with her obesity treatment plan. She is keeping a food journal with 1200 calories and 75 grams of protein and states she is following her eating plan approximately 50% of the time. She states she is exercising 0 minutes 0 times per week.  [name] states that now her fatigue and shortness of breath are improving she is ready to get back to weight loss. She had anemia and is now status post 4 iron infusions. She is ready to increase walking. She reports she is not always meeting her protein goals. [name] had a virtual visit today due to COVID restrictions.  Today's visit was #14 Starting weight: 234 lbs Starting date: 10/08/2017  Vitamin D deficiency [name] has a diagnosis of Vitamin D deficiency and is taking prescription Vitamin D. Last Vitamin D was 43.3 on 10/29/2018.  Insulin Resistance [name] has a diagnosis of insulin resistance and is not on medication for this.  Iron Deficiency Anemia [name] has iron deficiency anemia due to heavy menses (fibroid) plus Xarelto use. She is status post 4 iron infusions and is followed by Hematology. She is due for new CBC and TIBC.  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 She continues Xarelto for now.  ASSESSMENT AND PLAN:  Vitamin D deficiency - Plan: Vitamin D, Ergocalciferol, (DRISDOL) 1.25 MG (50000 UT) CAPS capsule  Insulin resistance  Other iron deficiency anemia  At risk for osteoporosis  Deep vein thrombosis (DVT) of non-extremity vein, unspecified chronicity  Class 2 severe obesity with serious comorbidity  and body mass index (BMI) of 36.0 to 36.9 in adult, unspecified obesity type ([place])  PLAN:  Vitamin D Deficiency [name] was informed that low Vitamin D levels contributes to fatigue and are associated with obesity, breast, and colon cancer. She agrees to continue to take prescription Vit D @ 50,000 IU every week #4 with 0 refills and will follow-up for routine testing of Vitamin D, at least 2-3 times per year. She was informed of the risk of over-replacement of Vitamin D and agrees to not increase her dose unless she discusses this with [name] first. [name] agrees to follow-up with our clinic in 3 weeks.  Insulin Resistance [name] will continue to work on weight loss, exercise, and decreasing simple carbohydrates to help decrease the risk of diabetes. [name] agreed to follow-up with [name] as directed to closely monitor her progress.  Iron Deficiency Anemia We will continue to watch.  History of DVT [name] will continue to work on weight loss to help decrease future risk.  Obesity [name] is currently in the action stage of change. As such, her goal is to continue with weight loss efforts. She has agreed to keep a food journal with 1200 calories and 75 grams of protein.  [name] has been instructed to work up to a goal of 150 minutes of combined cardio and strengthening exercise per week for weight loss and overall health benefits. We discussed the following Behavioral Modification Strategies today: increasing lean protein intake, decreasing simple carbohydrates, increase H20 intake, work on meal planning and easy cooking plans, and planning for success.  [name]  has agreed to follow-up with our clinic in 3 weeks. She was informed of the importance of frequent follow-up visits to maximize her success with intensive lifestyle modifications for her multiple health conditions.  ALLERGIES: Allergies  Allergen Reactions  . Dye Fdc Blue 1 [Brilliant Blue Fcf]   . Nickel     MEDICATIONS: Current Outpatient  Medications on File Prior to Visit  Medication Sig Dispense Refill  . ibuprofen (ADVIL) 800 MG tablet Take 800 mg by mouth every 8 (eight) hours.    . rivaroxaban (XARELTO) 20 MG TABS tablet Take 20 mg by mouth daily with supper.     No current facility-administered medications on file prior to visit.    PAST MEDICAL HISTORY: Past Medical History:  Diagnosis Date  . Acid reflux   . Anemia   . Back pain   . Bilateral bunions   . Chest pain   . Constipation   . DVT (deep venous thrombosis) (HCC)    right leg  . Dyspnea   . Gestational diabetes   . HTN (hypertension)   . Lactose intolerance   . Leg edema   . Vitamin D deficiency     PAST SURGICAL HISTORY: Past Surgical History:  Procedure Laterality Date  . ROUX-EN-Y PROCEDURE      SOCIAL HISTORY: Social History   Tobacco Use  . Smoking status: Never Smoker  . Smokeless tobacco: Never Used  Substance Use Topics  . Alcohol use: Yes    Comment: 1-2 a week  . Drug use: Never    FAMILY HISTORY: Family History  Problem Relation Age of Onset  . Hypertension Mother   . Heart disease Mother   . Thyroid disease Mother   . Obesity Mother    ROS: Review of Systems  Cardiovascular:       Positive for DVT.  Endo/Heme/Allergies:       Positive for iron deficiency anemia.   PHYSICAL EXAM: There were no vitals taken for this visit. There is no height or weight on file to calculate BMI.   Physical Exam:  VITALS: Per patient if applicable, see vitals. GENERAL: Alert and in no acute distress. CARDIOPULMONARY: No increased WOB. Speaking in clear sentences.  PSYCH: Pleasant and cooperative. Speech normal rate and rhythm. Affect is appropriate. Insight and judgement are appropriate. Attention is focused, linear, and appropriate.  NEURO: Oriented as arrived to appointment on time with no prompting.   RECENT LABS AND TESTS: BMET    Component Value Date/Time   NA 138 02/03/2018 1103   K 4.2 02/03/2018 1103   CL 103  02/03/2018 1103   CO2 22 02/03/2018 1103   GLUCOSE 94 02/03/2018 1103   GLUCOSE 99 01/16/2018 1618   BUN 13 02/03/2018 1103   CREATININE 0.91 02/03/2018 1103   CALCIUM 9.1 02/03/2018 1103   GFRNONAA 80 02/03/2018 1103   GFRAA 92 02/03/2018 1103   Lab Results  Component Value Date   HGBA1C 5.1 02/03/2018   HGBA1C 5.1 10/08/2017   Lab Results  Component Value Date   INSULIN 9.1 02/03/2018   INSULIN 4.3 10/08/2017   CBC    Component Value Date/Time   WBC 2.9 (L) 10/29/2018 0846   WBC 4.3 01/16/2018 1618   RBC 3.44 (L) 10/29/2018 0846   RBC 3.77 (L) 01/16/2018 1618   HGB 8.1 (L) 10/29/2018 0846   HCT 27.4 (L) 10/29/2018 0846   PLT 364 10/29/2018 0846   MCV 80 10/29/2018 0846   MCH 23.5 (L) 10/29/2018 [id number]  MCH 29.4 01/16/2018 1618   MCHC 29.6 (L) 10/29/2018 0846   MCHC 31.4 01/16/2018 1618   RDW 19.8 (H) 10/29/2018 0846   LYMPHSABS 1.8 01/16/2018 1618   LYMPHSABS 1.6 10/08/2017 0941   MONOABS 0.5 01/16/2018 1618   EOSABS 0.1 01/16/2018 1618   EOSABS 0.1 10/08/2017 0941   BASOSABS 0.0 01/16/2018 1618   BASOSABS 0.0 10/08/2017 0941   Iron/TIBC/Ferritin/ %Sat    Component Value Date/Time   IRON 13 (L) 10/29/2018 0846   TIBC 348 10/29/2018 0846   FERRITIN 7 (L) 10/29/2018 0846   IRONPCTSAT 4 (LL) 10/29/2018 0846   Lipid Panel     Component Value Date/Time   CHOL 183 10/08/2017 0941   TRIG 62 10/08/2017 0941   HDL 72 10/08/2017 0941   CHOLHDL 2.5 10/08/2017 0941   LDLCALC 99 10/08/2017 0941   Hepatic Function Panel     Component Value Date/Time   PROT 7.1 02/03/2018 1103   ALBUMIN 4.3 02/03/2018 1103   AST 22 02/03/2018 1103   ALT 18 02/03/2018 1103   ALKPHOS 57 02/03/2018 1103   BILITOT 0.7 02/03/2018 1103      Component Value Date/Time   TSH 0.789 10/08/2017 0941    I, Michaelene Song, am acting as Location manager for [employer]. Juleen China, DO  I have reviewed the above documentation for accuracy and completeness, and I agree with the above. Briscoe Deutscher, DO

## 2019-01-19 ENCOUNTER — Encounter (INDEPENDENT_AMBULATORY_CARE_PROVIDER_SITE_OTHER): Payer: Self-pay | Admitting: Family Medicine

## 2019-01-19 ENCOUNTER — Telehealth (INDEPENDENT_AMBULATORY_CARE_PROVIDER_SITE_OTHER): Payer: No Typology Code available for payment source | Admitting: Family Medicine

## 2019-01-19 ENCOUNTER — Other Ambulatory Visit: Payer: Self-pay

## 2019-01-19 DIAGNOSIS — U071 COVID-19: Secondary | ICD-10-CM

## 2019-01-19 DIAGNOSIS — E559 Vitamin D deficiency, unspecified: Secondary | ICD-10-CM | POA: Diagnosis not present

## 2019-01-19 DIAGNOSIS — E8881 Metabolic syndrome: Secondary | ICD-10-CM

## 2019-01-19 DIAGNOSIS — Z6836 Body mass index (BMI) 36.0-36.9, adult: Secondary | ICD-10-CM

## 2019-01-19 DIAGNOSIS — Z86718 Personal history of other venous thrombosis and embolism: Secondary | ICD-10-CM

## 2019-01-19 DIAGNOSIS — D508 Other iron deficiency anemias: Secondary | ICD-10-CM | POA: Diagnosis not present

## 2019-01-19 MED ORDER — VITAMIN D (ERGOCALCIFEROL) 1.25 MG (50000 UNIT) PO CAPS: 50000.0000 [IU] | ORAL_CAPSULE | ORAL | 0 refills | Status: DC

## 2019-01-19 NOTE — Progress Notes (Signed)
TeleHealth Visit:  Due to the COVID-19 pandemic, this visit was completed with telemedicine (audio/video) technology to reduce patient and provider exposure as well as to preserve personal protective equipment.    Allison Valenzuela has verbally consented to this TeleHealth visit. The patient is located at home, the provider is located at the [employer] [facility] office. The participants in this visit include the listed provider and patient. The visit was conducted today via [email or web address].  Chief Complaint: OBESITY Allison Valenzuela is here to discuss her progress with her obesity treatment plan. Allison Valenzuela is keeping a food journal and adhere to recommended goals of 1200 calories and 75 protein  and states she is following her eating plan approximately 20% of the time. Allison Valenzuela states she is doing some walking.  Today's visit was #: 15 Starting weight: 234 lbs Starting date: 10/08/2017  Subjective:   Interim History: Allison Valenzuela currently has COVID.  She states she is starting to feel better this week.  Still no taste or smell. Tolerated green juice only last week.  She is staying hydrated and resting as much as possible.  Assessment/Plan:   1. Vitamin D deficiency Low Vitamin D level contributes to fatigue and are associated with obesity, breast, and colon cancer. She agrees to continue to take prescription Vitamin D @50 ,000 IU every week and will follow-up for routine testing of vitamin D, at least 2-3 times per year to avoid over-replacement.  Orders - Vitamin D, Ergocalciferol, (DRISDOL) 1.25 MG (50000 UT) CAPS capsule; Take 1 capsule (50,000 Units total) by mouth every 7 (seven) days.  Dispense: 4 capsule; Refill: 0.  She agrees to follow-up with your clinic in 2 weeks.  2. Insulin resistance Allison Valenzuela has a diagnosis of insulin resistance based on her elevated fasting insulin level >5.  She continues to work on diet and exercise to decrease her risk of diabetes.  Lab Results  Component Value  Date   INSULIN 9.1 02/03/2018   INSULIN 4.3 10/08/2017   Allison Valenzuela will continue to work on weight loss, exercise, and decreasing simple carbohydrates to help decrease the risk of diabetes. Allison Valenzuela agreed to follow-up with [name] as directed to closely monitor her progress.  3. Other iron deficiency anemia This is secondary to chronic blood loss.  She is followed by Hematology at [facility].  She is staus post iron infusions (her last was 12/17/2018).  Care Everywhere shows her hemoglobin was 10.4 on 12/08/2018.  She is scheduled for her next labs in February 2021 and a vaginal hysterectomy on May 06, 2019.  We discussed monitoring for signs of increasing anemia for sooner testing.  CBC Latest Ref Rng & Units 10/29/2018 01/16/2018 10/08/2017  WBC 3.4 - 10.8 x10E3/uL 2.9(L) 4.3 3.0(L)  Hemoglobin 11.1 - 15.9 g/dL 8.1(L) 11.1(L) 11.7  Hematocrit 34.0 - 46.6 % 27.4(L) 35.3(L) 36.4  Platelets 150 - 450 x10E3/uL 364 259 270   Lab Results  Component Value Date   IRON 13 (L) 10/29/2018   TIBC 348 10/29/2018   FERRITIN 7 (L) 10/29/2018   Counseling . Iron is essential for our bodies to make red blood cells.  Reasons that someone may be deficient include: an iron-deficient diet (more likely in those following vegan or vegetarian diets), women with heavy menses, patients with GI disorders or poor absorption, patients that have had bariatric surgery, frequent blood donors, patients with cancer, and patients with heart disease.   [place] foods include dark leafy greens, red and white meats, eggs, seafood,  and beans.   . Certain foods and drinks prevent your body from absorbing iron properly. Avoid eating these foods in the same meal as iron-rich foods or with iron supplements. These foods include: coffee, black tea, and red wine; milk, dairy products, and foods that are high in calcium; beans and soybeans; whole grains.  . Constipation can be a side effect of iron supplementation. Increased water and fiber  intake are helpful. Water goal: > 2 liters/day. Fiber goal: > 25 grams/day.  4. COVID-19 Allison Valenzuela is getting another test at [facility] today.  We discussed symptomatic care, precautions, and red flags.  5. History of DVT (deep vein thrombosis) The patient is currently on Xarelto for this and is at increased risk for blood loss secondary to uterine fibroids.  Will continue to monitor.  6. Class 2 severe obesity with serious comorbidity and body mass index (BMI) of 36.0 to 36.9 in adult, unspecified obesity type (Allison Valenzuela) Allison Valenzuela is currently in the action stage of change. As such, her goal is to continue with weight loss efforts. She has agreed to keep a food journal and adhere to recommended goals of 1200 calories and 75 protein . We discussed the following exercise goals today: For substantial health benefits, adults should do at least 150 minutes (2 hours and 30 minutes) a week of moderate-intensity, or 75 minutes (1 hour and 15 minutes) a week of vigorous-intensity aerobic physical activity, or an equivalent combination of moderate- and vigorous-intensity aerobic activity. Aerobic activity should be performed in episodes of at least 10 minutes, and preferably, it should be spread throughout the week. Adults should also include muscle-strengthening activities that involve all major muscle groups on 2 or more days a week.   We discussed the following behavioral modification strategies today: increasing lean protein intake and increasing water intake.  Allison Valenzuela has agreed to follow-up with our clinic in 2 weeks. She was informed of the importance of frequent follow-up visits to maximize her success with intensive lifestyle modifications for her multiple health conditions.  Objective:   General: Cooperative, alert, well developed, in no acute distress. HEENT: Conjunctivae and lids unremarkable. Neck: No thyromegaly.  Cardiovascular: Regular rhythm.  Lungs: Normal work of breathing. Extremities: No  edema.  Neurologic: No focal deficits.   Lab Results  Component Value Date   CREATININE 0.91 02/03/2018   BUN 13 02/03/2018   NA 138 02/03/2018   K 4.2 02/03/2018   CL 103 02/03/2018   CO2 22 02/03/2018   Lab Results  Component Value Date   ALT 18 02/03/2018   AST 22 02/03/2018   ALKPHOS 57 02/03/2018   BILITOT 0.7 02/03/2018   Lab Results  Component Value Date   HGBA1C 5.1 02/03/2018   HGBA1C 5.1 10/08/2017   Lab Results  Component Value Date   INSULIN 9.1 02/03/2018   INSULIN 4.3 10/08/2017   Lab Results  Component Value Date   TSH 0.789 10/08/2017   Lab Results  Component Value Date   CHOL 183 10/08/2017   HDL 72 10/08/2017   LDLCALC 99 10/08/2017   TRIG 62 10/08/2017   CHOLHDL 2.5 10/08/2017   Lab Results  Component Value Date   WBC 2.9 (L) 10/29/2018   HGB 8.1 (L) 10/29/2018   HCT 27.4 (L) 10/29/2018   MCV 80 10/29/2018   PLT 364 10/29/2018   Lab Results  Component Value Date   IRON 13 (L) 10/29/2018   TIBC 348 10/29/2018   FERRITIN 7 (L) 10/29/2018   Attestation  Statements:   Reviewed by clinician on day of visit: allergies, medications, problem list, medical history, surgical history, family history, social history and previous encounter notes.  I, Water quality scientist, am acting as Location manager for [employer], DO.  I have reviewed the above documentation for accuracy and completeness, and I agree with the above. Briscoe Deutscher, DO

## 2019-01-20 NOTE — Telephone Encounter (Signed)
Sent to the patient email on file. Allison Valenzuela, [place]

## 2019-01-20 NOTE — Telephone Encounter (Signed)
Please review

## 2019-01-22 ENCOUNTER — Emergency Department (HOSPITAL_BASED_OUTPATIENT_CLINIC_OR_DEPARTMENT_OTHER)
Admission: EM | Admit: 2019-01-22 | Discharge: 2019-01-22 | Disposition: A | Discharge status: Home / Self Care | Payer: No Typology Code available for payment source | Attending: Emergency Medicine | Admitting: Emergency Medicine

## 2019-01-22 ENCOUNTER — Encounter (HOSPITAL_BASED_OUTPATIENT_CLINIC_OR_DEPARTMENT_OTHER): Payer: Self-pay | Admitting: *Deleted

## 2019-01-22 ENCOUNTER — Emergency Department (HOSPITAL_BASED_OUTPATIENT_CLINIC_OR_DEPARTMENT_OTHER): Payer: No Typology Code available for payment source

## 2019-01-22 ENCOUNTER — Other Ambulatory Visit: Payer: Self-pay

## 2019-01-22 DIAGNOSIS — Z79899 Other long term (current) drug therapy: Secondary | ICD-10-CM | POA: Insufficient documentation

## 2019-01-22 DIAGNOSIS — R0602 Shortness of breath: Secondary | ICD-10-CM | POA: Insufficient documentation

## 2019-01-22 DIAGNOSIS — R0789 Other chest pain: Secondary | ICD-10-CM | POA: Insufficient documentation

## 2019-01-22 DIAGNOSIS — I10 Essential (primary) hypertension: Secondary | ICD-10-CM | POA: Diagnosis not present

## 2019-01-22 LAB — CBC WITH DIFFERENTIAL/PLATELET
Abs Immature Granulocytes: 0.03 10*3/uL (ref 0.00–0.07)
Basophils Absolute: 0 10*3/uL (ref 0.0–0.1)
Basophils Relative: 0 %
Eosinophils Absolute: 0.1 10*3/uL (ref 0.0–0.5)
Eosinophils Relative: 2 %
HCT: 35.3 % — ABNORMAL LOW (ref 36.0–46.0)
Hemoglobin: 10.9 g/dL — ABNORMAL LOW (ref 12.0–15.0)
Immature Granulocytes: 1 %
Lymphocytes Relative: 40 %
Lymphs Abs: 1.9 10*3/uL (ref 0.7–4.0)
MCH: 28.5 pg (ref 26.0–34.0)
MCHC: 30.9 g/dL (ref 30.0–36.0)
MCV: 92.2 fL (ref 80.0–100.0)
Monocytes Absolute: 0.4 10*3/uL (ref 0.1–1.0)
Monocytes Relative: 8 %
Neutro Abs: 2.3 10*3/uL (ref 1.7–7.7)
Neutrophils Relative %: 49 %
Platelets: 391 10*3/uL (ref 150–400)
RBC: 3.83 MIL/uL — ABNORMAL LOW (ref 3.87–5.11)
RDW: 19.1 % — ABNORMAL HIGH (ref 11.5–15.5)
WBC: 4.7 10*3/uL (ref 4.0–10.5)
nRBC: 0 % (ref 0.0–0.2)

## 2019-01-22 LAB — BASIC METABOLIC PANEL
Anion gap: 6 (ref 5–15)
BUN: 19 mg/dL (ref 6–20)
CO2: 23 mmol/L (ref 22–32)
Calcium: 8.5 mg/dL — ABNORMAL LOW (ref 8.9–10.3)
Chloride: 106 mmol/L (ref 98–111)
Creatinine, Ser: 0.73 mg/dL (ref 0.44–1.00)
GFR calc Af Amer: >60 mL/min (ref >60)
GFR calc non Af Amer: >60 mL/min (ref >60)
Glucose, Bld: 91 mg/dL (ref 70–99)
Potassium: 4 mmol/L (ref 3.5–5.1)
Sodium: 135 mmol/L (ref 135–145)

## 2019-01-22 LAB — TROPONIN I (HIGH SENSITIVITY)
Troponin I (High Sensitivity): <2 ng/L (ref <18)
Troponin I (High Sensitivity): <2 ng/L (ref <18)

## 2019-01-22 MED ORDER — FAMOTIDINE 20 MG PO TABS: 20.0000 mg | ORAL_TABLET | Freq: Two times a day (BID) | ORAL | 0 refills | Status: DC

## 2019-01-22 MED ORDER — IOHEXOL 350 MG/ML SOLN
100.0000 mL | Freq: Once | INTRAVENOUS | Status: AC | PRN
  Administered 2019-01-22: 100 mL via INTRAVENOUS

## 2019-01-22 NOTE — ED Triage Notes (Signed)
Pt states she was positive Covid 01/08/2019. She test results for her repeat Covid test yesterday were negative. She is here today with chest pain and SOB.

## 2019-01-22 NOTE — ED Provider Notes (Signed)
[facility] EMERGENCY DEPARTMENT Provider Note   CSN: [id number] Arrival date & time: 01/22/19  1945     History Chief Complaint  Patient presents with  . Shortness of Breath  . Chest Pain    Allison Valenzuela is a 39 y.o. female.  Patient is a 39 year old female who presents with chest pain or shortness of breath.  She had a Covid infection started on December 15.  She was confirmed positive.  She said that she felt really bad for a while with chills, fevers and shortness of breath.  She was prescribed an inhaler and steroids by her PCP and her shortness of breath has gradually improved.  She had previously felt a heaviness across her chest which has been improving as well.  Over the last 2 days she has noted some intermittent pain to the center of her chest that radiates a little to the left side.  It is more sharp in nature.  It is nonpleuritic.  It comes and goes but usually last about 7 to 10 minutes when it is there.  She has no exertional symptoms.  She has a history of a prior DVT in July in her right leg and is currently on Xarelto.  She does not have any ongoing fevers.  Her last fever was prior to Christmas.  She says overall her Covid symptoms have been improving and her shortness of breath has been improving but given her new chest pain, her PCP sent her here for further evaluation.        Past Medical History:  Diagnosis Date  . Acid reflux   . Anemia   . Back pain   . Bilateral bunions   . Chest pain   . Constipation   . DVT (deep venous thrombosis) (HCC)    right leg  . Dyspnea   . Gestational diabetes   . HTN (hypertension)   . Lactose intolerance   . Leg edema   . Vitamin D deficiency     Patient Active Problem List   Diagnosis Date Noted  . Menorrhagia with regular cycle 10/27/2018  . Iron deficiency anemia due to chronic blood loss 10/27/2018  . History of DVT of lower extremity 10/27/2018  . B12 deficiency 10/27/2018  . Obesity (BMI  30-39.9) 10/27/2018  . Other insomnia 03/17/2018  . Vitamin D deficiency 02/19/2018  . Insulin resistance 02/19/2018  . Class 2 severe obesity with serious comorbidity and body mass index (BMI) of 36.0 to 36.9 in adult [facility]) 02/19/2018  . Ingrown toenail 09/12/2017  . Onychodystrophy 09/12/2017    Past Surgical History:  Procedure Laterality Date  . ROUX-EN-Y PROCEDURE       OB History    Gravida  3   Para      Term      Preterm      AB      Living  3     SAB      TAB      Ectopic      Multiple      Live Births              Family History  Problem Relation Age of Onset  . Hypertension Mother   . Heart disease Mother   . Thyroid disease Mother   . Obesity Mother     Social History   Tobacco Use  . Smoking status: Never Smoker  . Smokeless tobacco: Never Used  Substance Use Topics  . Alcohol use:  Yes    Comment: 1-2 a week  . Drug use: Never    Home Medications Prior to Admission medications   Medication Sig Start Date End Date Taking? Authorizing Provider  rivaroxaban (XARELTO) 20 MG TABS tablet Take 20 mg by mouth daily with supper.   Yes [provider]  famotidine (PEPCID) 20 MG tablet Take 1 tablet (20 mg total) by mouth 2 (two) times daily. 01/22/19   Malvin Johns, MD  ibuprofen (ADVIL) 800 MG tablet Take 800 mg by mouth every 8 (eight) hours. 11/04/18   [provider]  Vitamin D, Ergocalciferol, (DRISDOL) 1.25 MG (50000 UT) CAPS capsule Take 1 capsule (50,000 Units total) by mouth every 7 (seven) days. 01/19/19   Briscoe Deutscher, DO    Allergies    Dye fdc blue 1 [brilliant blue fcf] and Nickel  Review of Systems   Review of Systems  Constitutional: Positive for fatigue. Negative for chills, diaphoresis and fever.  HENT: Negative for congestion, rhinorrhea and sneezing.   Eyes: Negative.   Respiratory: Positive for chest tightness and shortness of breath. Negative for cough.   Cardiovascular: Positive for chest pain  and leg swelling (At baseline from prior DVT).  Gastrointestinal: Negative for abdominal pain, blood in stool, diarrhea, nausea and vomiting.  Genitourinary: Negative for difficulty urinating, flank pain, frequency and hematuria.  Musculoskeletal: Negative for arthralgias and back pain.  Skin: Negative for rash.  Neurological: Negative for dizziness, speech difficulty, weakness, numbness and headaches.    Physical Exam Updated Vital Signs BP (!) 105/47   Pulse 66   Temp 97.8 F (36.6 C) (Oral)   Resp 19   Ht 5\' 7"  (1.702 m)   Wt 106.6 kg   LMP 01/12/2019   SpO2 100%   BMI 36.81 kg/m   Physical Exam Constitutional:      Appearance: She is well-developed.  HENT:     Head: Normocephalic and atraumatic.  Eyes:     Pupils: Pupils are equal, round, and reactive to light.  Cardiovascular:     Rate and Rhythm: Normal rate and regular rhythm.     Heart sounds: Normal heart sounds.  Pulmonary:     Effort: Pulmonary effort is normal. No respiratory distress.     Breath sounds: Normal breath sounds. No wheezing or rales.  Chest:     Chest wall: No tenderness.  Abdominal:     General: Bowel sounds are normal.     Palpations: Abdomen is soft.     Tenderness: There is no abdominal tenderness. There is no guarding or rebound.  Musculoskeletal:        General: Normal range of motion.     Cervical back: Normal range of motion and neck supple.     Comments: Positive mild edema to the lower extremities bilaterally with slightly increased edema in the right lower extremity which the patient says is baseline for her since she was diagnosed with a DVT in July.  Lymphadenopathy:     Cervical: No cervical adenopathy.  Skin:    General: Skin is warm and dry.     Findings: No rash.  Neurological:     Mental Status: She is alert and oriented to person, place, and time.     ED Results / Procedures / Treatments   Labs (all labs ordered are listed, but only abnormal results are  displayed) Labs Reviewed  CBC WITH DIFFERENTIAL/PLATELET - Abnormal; Notable for the following components:      Result Value   RBC  3.83 (*)    Hemoglobin 10.9 (*)    HCT 35.3 (*)    RDW 19.1 (*)    All other components within normal limits  BASIC METABOLIC PANEL - Abnormal; Notable for the following components:   Calcium 8.5 (*)    All other components within normal limits  TROPONIN I (HIGH SENSITIVITY)  TROPONIN I (HIGH SENSITIVITY)    EKG EKG Interpretation  Date/Time:  Thursday January 22 2019 19:55:44 EST Ventricular Rate:  87 PR Interval:    QRS Duration: 83 QT Interval:  359 QTC Calculation: 432 R Axis:   37 Text Interpretation: Sinus rhythm Baseline wander in lead(s) V2 No old tracing to compare Confirmed by Malvin Johns [phone]) on 01/22/2019 8:13:42 PM   Radiology CT Angio Chest PE W/Cm &/Or Wo Cm  Result Date: 01/22/2019 CLINICAL DATA:  Chest pain and short of breath EXAM: CT ANGIOGRAPHY CHEST WITH CONTRAST TECHNIQUE: Multidetector CT imaging of the chest was performed using the standard protocol during bolus administration of intravenous contrast. Multiplanar CT image reconstructions and MIPs were obtained to evaluate the vascular anatomy. CONTRAST:  126mL OMNIPAQUE IOHEXOL 350 MG/ML SOLN COMPARISON:  CT 01/16/2018 FINDINGS: Cardiovascular: Satisfactory opacification of the pulmonary arteries to the segmental level. No evidence of pulmonary embolism. Nonaneurysmal aorta. No dissection. Normal heart size. No pericardial effusion. Mediastinum/Nodes: No enlarged mediastinal, hilar, or axillary lymph nodes. Thyroid gland, trachea, and esophagus demonstrate no significant findings. Lungs/Pleura: Interim finding of patchy foci of subpleural ground-glass density most evident in the upper lobes with mild ground-glass density in the left lower lobe. No pleural effusion or consolidation Upper Abdomen: Status post gastric bypass.  No acute abnormality Musculoskeletal: No chest wall  abnormality. No acute or significant osseous findings. Scoliosis. Review of the MIP images confirms the above findings. IMPRESSION: 1. Negative for acute pulmonary embolus or aortic dissection. 2. Interim development of scattered foci of mostly subpleural/peripheral ground-glass density, suggestive of possible atypical or viral pneumonia. Electronically Signed   By: Donavan Foil M.D.   On: 01/22/2019 22:00    Procedures Procedures (including critical care time)  Medications Ordered in ED Medications  iohexol (OMNIPAQUE) 350 MG/ML injection 100 mL (100 mLs Intravenous Contrast Given 01/22/19 2147)    ED Course  I have reviewed the triage vital signs and the nursing notes.  Pertinent labs & imaging results that were available during my care of the patient were reviewed by me and considered in my medical decision making (see chart for details).    MDM Rules/Calculators/A&P                      Patient presents with chest pain after recent Covid infection.  Her EKG does not show any ischemic changes.  She has had 2 - troponins.  Given her high risk of PE, CT scan was performed which showed no evidence of PE.  She does not have any ongoing chest pain.  No hypoxia.  No exertional symptoms.  Is mostly in the center of her chest which would be more concerning for things such as GERD.  She was given a prescription for Pepcid.  She was encouraged to follow-up with her PCP if her symptoms continue or return here as needed for any worsening symptoms.  Her labs show an anemia but it is actually better than her last values.  Her other labs are nonconcerning.  She has some patchy infiltrates on her CT scan which is likely from her recent Covid infection. Final Clinical  Impression(s) / ED Diagnoses Final diagnoses:  Atypical chest pain    Rx / DC Orders ED Discharge Orders         Ordered    famotidine (PEPCID) 20 MG tablet  2 times daily     01/22/19 2306           Malvin Johns, MD 01/22/19  2308

## 2019-01-22 NOTE — ED Notes (Signed)
Iv removed by this RN at this time

## 2019-01-26 ENCOUNTER — Other Ambulatory Visit (INDEPENDENT_AMBULATORY_CARE_PROVIDER_SITE_OTHER): Payer: Self-pay | Admitting: Family Medicine

## 2019-01-26 DIAGNOSIS — E559 Vitamin D deficiency, unspecified: Secondary | ICD-10-CM

## 2019-01-29 NOTE — Telephone Encounter (Signed)
Last OV 01/19/19 Last refill 01/19/19 #4/0 Next OV 02/02/19

## 2019-02-02 ENCOUNTER — Telehealth (INDEPENDENT_AMBULATORY_CARE_PROVIDER_SITE_OTHER): Payer: No Typology Code available for payment source | Admitting: Family Medicine

## 2019-02-02 ENCOUNTER — Other Ambulatory Visit: Payer: Self-pay

## 2019-02-02 ENCOUNTER — Encounter (INDEPENDENT_AMBULATORY_CARE_PROVIDER_SITE_OTHER): Payer: Self-pay | Admitting: Family Medicine

## 2019-02-02 DIAGNOSIS — Z87898 Personal history of other specified conditions: Secondary | ICD-10-CM

## 2019-02-02 DIAGNOSIS — D500 Iron deficiency anemia secondary to blood loss (chronic): Secondary | ICD-10-CM | POA: Diagnosis not present

## 2019-02-02 DIAGNOSIS — E559 Vitamin D deficiency, unspecified: Secondary | ICD-10-CM | POA: Diagnosis not present

## 2019-02-02 DIAGNOSIS — E88819 Insulin resistance, unspecified: Secondary | ICD-10-CM

## 2019-02-02 DIAGNOSIS — Z6836 Body mass index (BMI) 36.0-36.9, adult: Secondary | ICD-10-CM

## 2019-02-02 DIAGNOSIS — Z9189 Other specified personal risk factors, not elsewhere classified: Secondary | ICD-10-CM

## 2019-02-02 DIAGNOSIS — E8881 Metabolic syndrome: Secondary | ICD-10-CM | POA: Diagnosis not present

## 2019-02-02 DIAGNOSIS — R1013 Epigastric pain: Secondary | ICD-10-CM

## 2019-02-02 MED ORDER — VITAMIN D (ERGOCALCIFEROL) 1.25 MG (50000 UNIT) PO CAPS: 50000.0000 [IU] | ORAL_CAPSULE | ORAL | 0 refills | Status: DC

## 2019-02-02 NOTE — Progress Notes (Signed)
TeleHealth Visit:  Due to the COVID-19 pandemic, this visit was completed with telemedicine (audio/video) technology to reduce patient and provider exposure as well as to preserve personal protective equipment.   Allison Valenzuela has verbally consented to this TeleHealth visit. The patient is located at home, the provider is located at the [facility] office. The participants in this visit include the listed provider and patient. The visit was conducted today via [email or web address].  Chief Complaint: OBESITY Allison Valenzuela is here to discuss her progress with her obesity treatment plan along with follow-up of her obesity related diagnoses. Allison Valenzuela is on the Category 2 Plan and states she is following her eating plan approximately 80% of the time. Allison Valenzuela states she is walking for 40-45 minutes 3 times per week.  Today's visit was #: 16 Starting weight: 234 lbs  Starting date: 10/08/2017  Interim History: Allison Valenzuela had COVID on 12/30/2018.  She says she is feeling better now.  She increased her walking up to 1 mile last week, and did this on three different days.  She has been hungry in the mornings.  Subjective:   1. Insulin resistance Allison Valenzuela has a diagnosis of insulin resistance based on her elevated fasting insulin level >5. She continues to work on diet and exercise to decrease her risk of diabetes.  Lab Results  Component Value Date   INSULIN 9.1 02/03/2018   INSULIN 4.3 10/08/2017   Lab Results  Component Value Date   HGBA1C 5.1 02/03/2018   2. Vitamin D deficiency Allison Valenzuela's Vitamin D level was 43.3 on 10/29/2018.  This is up from 27.6 one hear ago. She is currently taking vit D. She denies nausea, vomiting or muscle weakness.    3. Iron deficiency anemia due to chronic blood loss She is followed by Hematology at [facility] for this.  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  Her hemoglobin on Wednesday, 01/22/2019, was 10.9.  4. History of chest pain Allison Valenzuela recently  went to the ED for this (01/22/2019).  I reviewed that visit with the patient.  CC was CP, workup for cardiopulmonary etiologies. All labs and studies thankfully negative. Dx with dyspepsia and sent out with Pepcid.  5. At risk for bleeding related to anticoagulants The patient is on Xarelto.  6. Dyspepsia Allison Valenzuela was given Pepcid in the ED for dyspepsia. It has helped with symptoms.  7. At risk for nausea Patient states she gets nauseated if she is too full.  Assessment/Plan:   1. Insulin resistance Allison Valenzuela will continue to work on weight loss, exercise, and decreasing simple carbohydrates to help decrease the risk of diabetes. Allison Valenzuela agreed to follow-up with [name] as directed to closely monitor her progress.  2. Vitamin D deficiency Low Vitamin D level contributes to fatigue and are associated with obesity, breast, and colon cancer. She agrees to continue to take prescription Vitamin D @50 ,000 IU every week and will follow-up for routine testing of Vitamin D, at least 2-3 times per year to avoid over-replacement.  - Vitamin D, Ergocalciferol, (DRISDOL) 1.25 MG (50000 UNIT) CAPS capsule; Take 1 capsule (50,000 Units total) by mouth every 7 (seven) days.  Dispense: 4 capsule; Refill: 0  3. Iron deficiency anemia due to chronic blood loss Will monitor.  4. History of chest pain Allison Valenzuela was diagnosed with dyspepsia after cardiac etiologies were ruled out when she went to the ED.  5. At risk for bleeding related to anticoagulants Will monitor.  6. Dyspepsia  This is improving.  7. At risk for nausea Allison Valenzuela Allison Valenzuela was given approximately 15 minutes of nausea prevention counseling today. Allison Valenzuela is at risk for nausea due to her new or current medication. She was encouraged to titrate her medication slowly, make sure to stay hydrated, eat smaller portions throughout the day, and avoid high fat meals.   8. Class 2 severe obesity with serious comorbidity and body mass index (BMI) of 36.0 to 36.9 in  adult, unspecified obesity type (Allison Valenzuela) Allison Valenzuela is currently in the action stage of change. As such, her goal is to continue with weight loss efforts. She has agreed to the Category 3 Plan.   Exercise goals: For substantial health benefits, adults should do at least 150 minutes (2 hours and 30 minutes) a week of moderate-intensity, or 75 minutes (1 hour and 15 minutes) a week of vigorous-intensity aerobic physical activity, or an equivalent combination of moderate- and vigorous-intensity aerobic activity. Aerobic activity should be performed in episodes of at least 10 minutes, and preferably, it should be spread throughout the week. Adults should also include muscle-strengthening activities that involve all major muscle groups on 2 or more days a week.  Behavioral modification strategies: increasing lean protein intake, decreasing simple carbohydrates and planning for success.  Allison Valenzuela has agreed to follow-up with our clinic in 2 weeks. She was informed of the importance of frequent follow-up visits to maximize her success with intensive lifestyle modifications for her multiple health conditions.  Objective:   VITALS: Per patient if applicable, see vitals. GENERAL: Alert and in no acute distress. CARDIOPULMONARY: No increased WOB. Speaking in clear sentences.  PSYCH: Pleasant and cooperative. Speech normal rate and rhythm. Affect is appropriate. Insight and judgement are appropriate. Attention is focused, linear, and appropriate.  NEURO: Oriented as arrived to appointment on time with no prompting.   Lab Results  Component Value Date   CREATININE 0.73 01/22/2019   BUN 19 01/22/2019   NA 135 01/22/2019   K 4.0 01/22/2019   CL 106 01/22/2019   CO2 23 01/22/2019   Lab Results  Component Value Date   ALT 18 02/03/2018   AST 22 02/03/2018   ALKPHOS 57 02/03/2018   BILITOT 0.7 02/03/2018   Lab Results  Component Value Date   HGBA1C 5.1 02/03/2018   HGBA1C 5.1 10/08/2017   Lab Results    Component Value Date   INSULIN 9.1 02/03/2018   INSULIN 4.3 10/08/2017   Lab Results  Component Value Date   TSH 0.789 10/08/2017   Lab Results  Component Value Date   CHOL 183 10/08/2017   HDL 72 10/08/2017   LDLCALC 99 10/08/2017   TRIG 62 10/08/2017   CHOLHDL 2.5 10/08/2017   Lab Results  Component Value Date   WBC 4.7 01/22/2019   HGB 10.9 (L) 01/22/2019   HCT 35.3 (L) 01/22/2019   MCV 92.2 01/22/2019   PLT 391 01/22/2019   Lab Results  Component Value Date   IRON 13 (L) 10/29/2018   TIBC 348 10/29/2018   FERRITIN 7 (L) 10/29/2018    Attestation Statements:   Reviewed by clinician on day of visit: allergies, medications, problem list, medical history, surgical history, family history, social history, and previous encounter notes.  I, Water quality scientist, CMA, am acting as Location manager for [employer], DO.  I have reviewed the above documentation for accuracy and completeness, and I agree with the above. Briscoe Deutscher, DO

## 2019-02-11 ENCOUNTER — Encounter (INDEPENDENT_AMBULATORY_CARE_PROVIDER_SITE_OTHER): Payer: Self-pay | Admitting: Family Medicine

## 2019-02-16 ENCOUNTER — Encounter (INDEPENDENT_AMBULATORY_CARE_PROVIDER_SITE_OTHER): Payer: Self-pay | Admitting: Family Medicine

## 2019-02-16 ENCOUNTER — Other Ambulatory Visit: Payer: Self-pay

## 2019-02-16 ENCOUNTER — Ambulatory Visit (INDEPENDENT_AMBULATORY_CARE_PROVIDER_SITE_OTHER): Payer: No Typology Code available for payment source | Admitting: Family Medicine

## 2019-02-16 VITALS — BP 109/76 | HR 78 | Temp 98.1°F | Ht 67.0 in | Wt 242.0 lb

## 2019-02-16 DIAGNOSIS — E559 Vitamin D deficiency, unspecified: Secondary | ICD-10-CM | POA: Diagnosis not present

## 2019-02-16 DIAGNOSIS — D500 Iron deficiency anemia secondary to blood loss (chronic): Secondary | ICD-10-CM

## 2019-02-16 DIAGNOSIS — Z6838 Body mass index (BMI) 38.0-38.9, adult: Secondary | ICD-10-CM

## 2019-02-16 DIAGNOSIS — E538 Deficiency of other specified B group vitamins: Secondary | ICD-10-CM | POA: Diagnosis not present

## 2019-02-16 DIAGNOSIS — E8881 Metabolic syndrome: Secondary | ICD-10-CM

## 2019-02-16 MED ORDER — VITAMIN D (ERGOCALCIFEROL) 1.25 MG (50000 UNIT) PO CAPS: 50000.0000 [IU] | ORAL_CAPSULE | ORAL | 0 refills | Status: DC

## 2019-02-16 NOTE — Progress Notes (Signed)
Chief Complaint:   OBESITY Allison Valenzuela is here to discuss her progress with her obesity treatment plan along with follow-up of her obesity related diagnoses. Allison Valenzuela is on the Category 3 Plan and states she is following her eating plan approximately 50% of the time. Allison Valenzuela states she is exercising for 0 minutes 0 times per week.  Today's visit was #: 69 Starting weight: 234 lbs Starting date: 10/08/2017 Today's weight: 242 lbs Today's date: 02/16/2019 Total lbs lost to date: 0 Total lbs lost since last in-office visit: 0  Interim History: Allison Valenzuela is frustrated with her wight gain.  Prior to [place], she was walking for around 3 hours a day.  She also notes that bread causes bloating.  Subjective:   1. Vitamin D deficiency Allison Valenzuela's Vitamin D level was 43.3 on 10/29/2018. She is currently taking vit D. She denies nausea, vomiting or muscle weakness.  2. Iron deficiency anemia due to chronic blood loss She is followed by Hematology at [facility].  This is complicated by history of DVT and is taking Xarelto.  Allison Valenzuela is not a vegetarian.  She has a history of weight loss surgery.   CBC Latest Ref Rng & Units 01/22/2019 10/29/2018 01/16/2018  WBC 4.0 - 10.5 K/uL 4.7 2.9(L) 4.3  Hemoglobin 12.0 - 15.0 g/dL 10.9(L) 8.1(L) 11.1(L)  Hematocrit 36.0 - 46.0 % 35.3(L) 27.4(L) 35.3(L)  Platelets 150 - 400 K/uL 391 364 259   Lab Results  Component Value Date   IRON 13 (L) 10/29/2018   TIBC 348 10/29/2018   FERRITIN 7 (L) 10/29/2018   Lab Results  Component Value Date   VITAMINB12 840 10/29/2018   3. Insulin resistance Allison Valenzuela has a diagnosis of insulin resistance based on her elevated fasting insulin level >5. She continues to work on diet and exercise to decrease her risk of diabetes.  Lab Results  Component Value Date   INSULIN 9.1 02/03/2018   INSULIN 4.3 10/08/2017   Lab Results  Component Value Date   HGBA1C 5.1 02/03/2018   4. B12 deficiency Resolved.   Lab Results  Component Value  Date   VITAMINB12 840 10/29/2018   Assessment/Plan:   1. Vitamin D deficiency Low Vitamin D level contributes to fatigue and are associated with obesity, breast, and colon cancer. She agrees to continue to take prescription Vitamin D @50 ,000 IU every week and will follow-up for routine testing of Vitamin D, at least 2-3 times per year to avoid over-replacement.  Orders - Vitamin D, Ergocalciferol, (DRISDOL) 1.25 MG (50000 UNIT) CAPS capsule; Take 1 capsule (50,000 Units total) by mouth every 7 (seven) days.  Dispense: 4 capsule; Refill: 0  2. Iron deficiency anemia due to chronic blood loss Orders and follow up as documented in patient record.  Counseling . Iron is essential for our bodies to make red blood cells.  Reasons that someone may be deficient include: an iron-deficient diet (more likely in those following vegan or vegetarian diets), women with heavy menses, patients with GI disorders or poor absorption, patients that have had bariatric surgery, frequent blood donors, patients with cancer, and patients with heart disease.   Marland Kitchen An iron supplement has been recommended. This is found over-the-counter. We recommend: Once Daily: Slow Fe 45mg  Iron Supplement for Iron Deficiency, Slow Release, High Potency, Easy to Swallow Tablets..  . Iron-rich foods include dark leafy greens, red and white meats, eggs, seafood, and beans.   . Certain foods and drinks prevent your body from absorbing iron properly. Avoid  eating these foods in the same meal as iron-rich foods or with iron supplements. These foods include: coffee, black tea, and red wine; milk, dairy products, and foods that are high in calcium; beans and soybeans; whole grains.  . Constipation can be a side effect of iron supplementation. Increased water and fiber intake are helpful. Water goal: > 2 liters/day. Fiber goal: > 25 grams/day.  3. Insulin resistance Allison Valenzuela will continue to work on weight loss, exercise, and decreasing simple  carbohydrates to help decrease the risk of diabetes. Allison Valenzuela agreed to follow-up with [name] as directed to closely monitor her progress.  4. B12 deficiency We will continue to monitor.   Counseling . The body needs vitamin B12: to make red blood cells; to make DNA; and to help the nerves work properly so they can carry messages from the brain to the body.  . The main causes of vitamin B12 deficiency include dietary deficiency, digestive diseases, pernicious anemia, and having a surgery in which part of the stomach or small intestine is removed.  . Certain medicines can make it harder for the body to absorb vitamin B12. These medicines include: heartburn medications; some antibiotics; some medications used to treat diabetes, gout, and high cholesterol.  . In some cases, there are no symptoms of this condition. If the condition leads to anemia or nerve damage, various symptoms can occur, such as weakness or fatigue, shortness of breath, and numbness or tingling in your hands and feet.   . Treatment:  o May include taking vitamin B12 supplements.  o Avoid alcohol.  o Eat lots of healthy foods that contain vitamin B12: - Beef, pork, chicken, [place], and organ meats, such as liver.  - Seafood: This includes clams, rainbow trout, salmon, tuna, and haddock. Eggs.  - Cereal and dairy products that are fortified: This means that vitamin B12 has been added to the food.   5. Class 2 severe obesity with serious comorbidity and body mass index (BMI) of 38.0 to 38.9 in adult, unspecified obesity type ([place]) Allison Valenzuela is currently in the action stage of change. As such, her goal is to continue with weight loss efforts. She has agreed to keeping a food journal and adhering to recommended goals of 1200-1500 calories and 85+ grams of protein.   Exercise goals: Consider stopping work at 10 am to walk, then back to work (remotely).  Behavioral modification strategies: increasing lean protein intake, increasing water intake  and meal planning and cooking strategies.  Allison Valenzuela has agreed to follow-up with our clinic in 2 weeks. She was informed of the importance of frequent follow-up visits to maximize her success with intensive lifestyle modifications for her multiple health conditions.   Objective:   Blood pressure 109/76, pulse 78, temperature 98.1 F (36.7 C), temperature source Oral, height 5\' 7"  (1.702 m), weight 242 lb (109.8 kg), last menstrual period 02/10/2019, SpO2 100 %. Body mass index is 37.9 kg/m.  General: Cooperative, alert, well developed, in no acute distress. HEENT: Conjunctivae and lids unremarkable. Cardiovascular: Regular rhythm.  Lungs: Normal work of breathing. Neurologic: No focal deficits.   Lab Results  Component Value Date   CREATININE 0.73 01/22/2019   BUN 19 01/22/2019   NA 135 01/22/2019   K 4.0 01/22/2019   CL 106 01/22/2019   CO2 23 01/22/2019   Lab Results  Component Value Date   ALT 18 02/03/2018   AST 22 02/03/2018   ALKPHOS 57 02/03/2018   BILITOT 0.7 02/03/2018   Lab Results  Component Value Date   HGBA1C 5.1 02/03/2018   HGBA1C 5.1 10/08/2017   Lab Results  Component Value Date   INSULIN 9.1 02/03/2018   INSULIN 4.3 10/08/2017   Lab Results  Component Value Date   TSH 0.789 10/08/2017   Lab Results  Component Value Date   CHOL 183 10/08/2017   HDL 72 10/08/2017   LDLCALC 99 10/08/2017   TRIG 62 10/08/2017   CHOLHDL 2.5 10/08/2017   Lab Results  Component Value Date   WBC 4.7 01/22/2019   HGB 10.9 (L) 01/22/2019   HCT 35.3 (L) 01/22/2019   MCV 92.2 01/22/2019   PLT 391 01/22/2019   Lab Results  Component Value Date   IRON 13 (L) 10/29/2018   TIBC 348 10/29/2018   FERRITIN 7 (L) 10/29/2018   Attestation Statements:   Reviewed by clinician on day of visit: allergies, medications, problem list, medical history, surgical history, family history, social history, and previous encounter notes.  I performed a medically necessary  appropriate examination and/or evaluation. I discussed the assessment and treatment plan with the patient. The patient was provided an opportunity to ask questions and all were answered. The patient agreed with the plan and demonstrated an understanding of the instructions. Clinical information was updated and documented in the EMR. Time spent on visit including pre-visit chart review and post-visit care was 40 minutes.  I, Water quality scientist, CMA, am acting as Location manager for [employer], DO.  I have reviewed the above documentation for accuracy and completeness, and I agree with the above. Briscoe Deutscher, DO

## 2019-03-04 ENCOUNTER — Other Ambulatory Visit: Payer: Self-pay

## 2019-03-04 ENCOUNTER — Ambulatory Visit (INDEPENDENT_AMBULATORY_CARE_PROVIDER_SITE_OTHER): Payer: No Typology Code available for payment source | Admitting: Family Medicine

## 2019-03-19 ENCOUNTER — Ambulatory Visit (INDEPENDENT_AMBULATORY_CARE_PROVIDER_SITE_OTHER): Payer: 59 | Admitting: Family Medicine

## 2019-04-02 ENCOUNTER — Other Ambulatory Visit (INDEPENDENT_AMBULATORY_CARE_PROVIDER_SITE_OTHER): Payer: Self-pay | Admitting: Family Medicine

## 2019-04-02 DIAGNOSIS — E559 Vitamin D deficiency, unspecified: Secondary | ICD-10-CM

## 2019-04-13 ENCOUNTER — Ambulatory Visit (INDEPENDENT_AMBULATORY_CARE_PROVIDER_SITE_OTHER): Payer: 59 | Admitting: Podiatry

## 2019-04-13 ENCOUNTER — Encounter (INDEPENDENT_AMBULATORY_CARE_PROVIDER_SITE_OTHER): Payer: Self-pay | Admitting: Family Medicine

## 2019-04-13 ENCOUNTER — Encounter: Payer: Self-pay | Admitting: Podiatry

## 2019-04-13 ENCOUNTER — Other Ambulatory Visit: Payer: Self-pay

## 2019-04-13 ENCOUNTER — Ambulatory Visit (INDEPENDENT_AMBULATORY_CARE_PROVIDER_SITE_OTHER): Payer: 59 | Admitting: Family Medicine

## 2019-04-13 VITALS — BP 113/77 | HR 88 | Temp 97.9°F | Ht 67.0 in | Wt 239.0 lb

## 2019-04-13 DIAGNOSIS — Z6837 Body mass index (BMI) 37.0-37.9, adult: Secondary | ICD-10-CM

## 2019-04-13 DIAGNOSIS — E8881 Metabolic syndrome: Secondary | ICD-10-CM | POA: Diagnosis not present

## 2019-04-13 DIAGNOSIS — E559 Vitamin D deficiency, unspecified: Secondary | ICD-10-CM

## 2019-04-13 DIAGNOSIS — Z9189 Other specified personal risk factors, not elsewhere classified: Secondary | ICD-10-CM

## 2019-04-13 DIAGNOSIS — B351 Tinea unguium: Secondary | ICD-10-CM | POA: Diagnosis not present

## 2019-04-13 MED ORDER — VITAMIN D (ERGOCALCIFEROL) 1.25 MG (50000 UNIT) PO CAPS: 50000.0000 [IU] | ORAL_CAPSULE | ORAL | 0 refills | Status: AC

## 2019-04-13 NOTE — Patient Instructions (Signed)
I have ordered a medication for you that will come from [facility] in [place]. They should be calling you to verify insurance and will mail the medication to you. If you live close by then you can go by their pharmacy to pick up the medication. Their phone number is [phone]. If you do not hear from them in the next few days, please give us a call at [phone].   

## 2019-04-14 NOTE — Progress Notes (Signed)
Subjective: 39 year old female presents the office today for concerns of her right big toenail becoming painful at times.  She states that she has fungus in the nail and she wants to consider laser treatment.  Pain is intermittent.  Denies any redness or drainage or any swelling.Denies any systemic complaints such as fevers, chills, nausea, vomiting. No acute changes since last appointment, and no other complaints at this time.   Objective: AAO x3, NAD DP/PT pulses palpable bilaterally, CRT less than 3 seconds Nails hypertrophic, dystrophic with yellow-brown discoloration.  No significant pain today but there is mild discomfort along the nail borders although minimal.  There is no drainage or pus or any signs of infection. No open lesions or pre-ulcerative lesions.  No pain with calf compression, swelling, warmth, erythema      Assessment: Onychomycosis, ingrown toenail right hallux  Plan: -All treatment options discussed with the patient including all alternatives, risks, complications.  -Discussed with a partial nail avulsion but she was to hold off on this.  Discussing options for nail fungus.  She is wearing schedule her for laser treatment.  Also I ordered a compound cream today through [facility] for [facility] fungus.  If no improvement discussed with her nail removal or partial nail removal. -Patient encouraged to call the office with any questions, concerns, change in symptoms.   Trula Slade DPM

## 2019-04-14 NOTE — Progress Notes (Signed)
Chief Complaint:   OBESITY Allison Valenzuela is here to discuss her progress with her obesity treatment plan along with follow-up of her obesity related diagnoses. Allison Valenzuela is on keeping a food journal and adhering to recommended goals of 1200-1500 calories and 85+ grams of protein and states she is following her eating plan approximately 50% of the time. Allison Valenzuela states she is walking for 3 miles 3 times per week.  Today's visit was #: 28 Starting weight: 234 lbs Starting date: 10/08/2017 Today's weight: 239 lbs Today's date: 04/13/2019 Total lbs lost to date: 0 Total lbs lost since last in-office visit: 3 lbs  Interim History: Allison Valenzuela has had quite a few other health issues.  She has a hysterectomy coming up on 05/06/2019.  She voices that she tries to journal, but her kids are still at home and she is trying to stay on top of her iron.  Her friend is going to stay with her for a month.  Subjective:   1. Vitamin D deficiency Allison Valenzuela's Vitamin D level was 49.3 on 10/29/2018. She is currently taking prescription vitamin D 50,000 IU each week. She denies nausea, vomiting or muscle weakness.  She endorses fatigue.  2. Insulin resistance Allison Valenzuela has a diagnosis of insulin resistance based on her elevated fasting insulin level >5. She continues to work on diet and exercise to decrease her risk of diabetes.  She is not on any medications.  Lab Results  Component Value Date   INSULIN 9.1 02/03/2018   INSULIN 4.3 10/08/2017   Lab Results  Component Value Date   HGBA1C 5.1 02/03/2018   3. At risk for osteoporosis Allison Valenzuela is at higher risk of osteopenia and osteoporosis due to Vitamin D deficiency.   Assessment/Plan:   1. Vitamin D deficiency Low Vitamin D level contributes to fatigue and are associated with obesity, breast, and colon cancer. She agrees to continue to take prescription Vitamin D @50 ,000 IU every week and will follow-up for routine testing of Vitamin D, at least 2-3 times per year to avoid  over-replacement. - Vitamin D, Ergocalciferol, (DRISDOL) 1.25 MG (50000 UNIT) CAPS capsule; Take 1 capsule (50,000 Units total) by mouth every 7 (seven) days.  Dispense: 4 capsule; Refill: 0  2. Insulin resistance Allison Valenzuela will continue to work on weight loss, exercise, and decreasing simple carbohydrates to help decrease the risk of diabetes. Allison Valenzuela agreed to follow-up with [name] as directed to closely monitor her progress.  Will recheck labs in June 2021 at first visit after hysterectomy.  3. At risk for osteoporosis Allison Valenzuela was given approximately 15 minutes of osteoporosis prevention counseling today. Allison Valenzuela is at risk for osteopenia and osteoporosis due to her Vitamin D deficiency. She was encouraged to take her Vitamin D and follow her higher calcium diet and increase strengthening exercise to help strengthen her bones and decrease her risk of osteopenia and osteoporosis.  Repetitive spaced learning was employed today to elicit superior memory formation and behavioral change.  4. Class 2 severe obesity with serious comorbidity and body mass index (BMI) of 37.0 to 37.9 in adult, unspecified obesity type (Allison Valenzuela) Allison Valenzuela is currently in the action stage of change. As such, her goal is to continue with weight loss efforts. She has agreed to keeping a food journal and adhering to recommended goals of 1200-1500 calories and 85+ grams of protein daily.   Exercise goals: As is.  Behavioral modification strategies: increasing lean protein intake, increasing vegetables, meal planning and cooking strategies, keeping healthy foods in the  home and planning for success.  Allison Valenzuela has agreed to follow-up with our clinic in 2-3 weeks. She was informed of the importance of frequent follow-up visits to maximize her success with intensive lifestyle modifications for her multiple health conditions.   Objective:   Blood pressure 113/77, pulse 88, temperature 97.9 F (36.6 C), temperature source Oral, height 5\' 7"  (1.702 m),  weight 239 lb (108.4 kg), last menstrual period 04/08/2019, SpO2 99 %. Body mass index is 37.43 kg/m.  General: Cooperative, alert, well developed, in no acute distress. HEENT: Conjunctivae and lids unremarkable. Cardiovascular: Regular rhythm.  Lungs: Normal work of breathing. Neurologic: No focal deficits.   Lab Results  Component Value Date   CREATININE 0.73 01/22/2019   BUN 19 01/22/2019   NA 135 01/22/2019   K 4.0 01/22/2019   CL 106 01/22/2019   CO2 23 01/22/2019   Lab Results  Component Value Date   ALT 18 02/03/2018   AST 22 02/03/2018   ALKPHOS 57 02/03/2018   BILITOT 0.7 02/03/2018   Lab Results  Component Value Date   HGBA1C 5.1 02/03/2018   HGBA1C 5.1 10/08/2017   Lab Results  Component Value Date   INSULIN 9.1 02/03/2018   INSULIN 4.3 10/08/2017   Lab Results  Component Value Date   TSH 0.789 10/08/2017   Lab Results  Component Value Date   CHOL 183 10/08/2017   HDL 72 10/08/2017   LDLCALC 99 10/08/2017   TRIG 62 10/08/2017   CHOLHDL 2.5 10/08/2017   Lab Results  Component Value Date   WBC 4.7 01/22/2019   HGB 10.9 (L) 01/22/2019   HCT 35.3 (L) 01/22/2019   MCV 92.2 01/22/2019   PLT 391 01/22/2019   Lab Results  Component Value Date   IRON 13 (L) 10/29/2018   TIBC 348 10/29/2018   FERRITIN 7 (L) 10/29/2018   Attestation Statements:   Reviewed by clinician on day of visit: allergies, medications, problem list, medical history, surgical history, family history, social history, and previous encounter notes.  I, Water quality scientist, CMA, am acting as transcriptionist for Coralie Common, MD.  I have reviewed the above documentation for accuracy and completeness, and I agree with the above. - Ilene Qua, MD

## 2019-04-20 ENCOUNTER — Other Ambulatory Visit: Payer: Self-pay

## 2019-04-20 ENCOUNTER — Ambulatory Visit (INDEPENDENT_AMBULATORY_CARE_PROVIDER_SITE_OTHER): Payer: 59 | Admitting: *Deleted

## 2019-04-20 DIAGNOSIS — B351 Tinea unguium: Secondary | ICD-10-CM

## 2019-04-20 NOTE — Progress Notes (Signed)
Patient presents today for the 1st laser treatment. Diagnosed with mycotic nail infection by Dr. Jacqualyn Posey. Toenail most affected hallux nail right.  All other systems are negative.  Nails were filed thin. Laser therapy was administered to 1st toenails right and patient tolerated the treatment well. All safety precautions were in place.    Follow up in 4 weeks for laser # 2.  Picture of nails taken on 04/13/19.

## 2019-04-20 NOTE — Patient Instructions (Signed)

## 2019-04-21 ENCOUNTER — Other Ambulatory Visit: Payer: Self-pay | Admitting: Obstetrics and Gynecology

## 2019-04-30 ENCOUNTER — Ambulatory Visit (INDEPENDENT_AMBULATORY_CARE_PROVIDER_SITE_OTHER): Payer: 59 | Admitting: Family Medicine

## 2019-04-30 NOTE — Progress Notes (Addendum)
PCP - Marda Stalker, [facility] Cardiologist -   Chest x-ray -  EKG - 01-22-19 Stress Test -  ECHO -  Cardiac Cath -   Sleep Study -  CPAP -   Fasting Blood Sugar -  Checks Blood Sugar _____ times a day  Blood Thinner Instructions: Xarelto Aspirin Instructions: Last Dose:  Anesthesia review:   Patient denies shortness of breath, fever, cough and chest pain at PAT appointment   Patient verbalized understanding of instructions that were given to them at the PAT appointment. Patient was also instructed that they will need to review over the PAT instructions again at home before surgery.

## 2019-04-30 NOTE — Patient Instructions (Addendum)
YOU ARE SCHEDULED FOR A COVID TEST  4-17-21_@ 9:45 AM. THIS TEST MUST BE DONE BEFORE SURGERY. GO TO  [address], [place], [address] AND REMAIN IN YOUR CAR, THIS IS A DRIVE UP TEST. ONCE YOUR COVID TEST IS DONE PLEASE FOLLOW ALL THE QUARANTINE  INSTRUCTIONS GIVEN IN YOUR HANDOUT.      Your procedure is scheduled on 05-06-19    Report to [place] [facility].   Call this number if you have problems the morning of surgery  :[phone].   OUR ADDRESS IS [address].  WE ARE LOCATED IN [facility].                                     REMEMBER: DO NOT EAT FOOD OR DRINK LIQUIDS AFTER MIDNIGHT .   YOU MAY  BRUSH YOUR TEETH MORNING OF SURGERY AND RINSE YOUR MOUTH OUT, NO CHEWING GUM CANDY OR MINTS.   TAKE THESE MEDICATIONS MORNING OF SURGERY WITH A SIP OF WATER:  Zyrtec D (Cetirizine -Pseudoephedrine)  IF YOU ARE SPENDING THE NIGHT AFTER SURGERY  PLEASE BRING ALL YOUR PRESCRIPTION MEDICATIONS IN THEIR ORIGINAL BOTTLES.   1 VISITOR IS ALLOWED IN WAITING ROOM ONLY DAY OF SURGERY.   NO VISITOR MAY SPEND THE NIGHT. VISITOR ARE ALLOWED TO STAY UNTIL 800 PM.                                    DO NOT WEAR JEWERLY, MAKE UP, OR NAIL POLISH ON FINGERNAILS.  DO NOT WEAR LOTIONS, POWDERS, PERFUMES OR DEODORANT.  DO NOT SHAVE FOR 24 HOURS PRIOR TO DAY OF SURGERY.  CONTACTS, GLASSES, OR DENTURES MAY NOT BE WORN TO SURGERY.                                    [facility] [facility] IS NOT RESPONSIBLE  FOR ANY BELONGINGS.                                                                    Marland Kitchen                                                                                                    [place] - Preparing for Surgery Before surgery, you can play an important role.  Because skin is not sterile, your skin needs to be as free of germs as possible.  You can reduce the number of germs on your skin by washing with CHG (chlorahexidine gluconate) soap before  surgery.  CHG is an antiseptic cleaner which kills  germs and bonds with the skin to continue killing germs even after washing. Please DO NOT use if you have an allergy to CHG or antibacterial soaps.  If your skin becomes reddened/irritated stop using the CHG and inform your nurse when you arrive at Short Stay. Do not shave (including legs and underarms) for at least 48 hours prior to the first CHG shower.  You may shave your face/neck. Please follow these instructions carefully:  1.  Shower with CHG Soap the night before surgery and the  morning of Surgery.  2.  If you choose to wash your hair, wash your hair first as usual with your  normal  shampoo.  3.  After you shampoo, rinse your hair and body thoroughly to remove the  shampoo.                           4.  Use CHG as you would any other liquid soap.  You can apply chg directly  to the skin and wash                       Gently with a scrungie or clean washcloth.  5.  Apply the CHG Soap to your body ONLY FROM THE NECK DOWN.   Do not use on face/ open                           Wound or open sores. Avoid contact with eyes, ears mouth and genitals (private parts).                       Wash face,  Genitals (private parts) with your normal soap.             6.  Wash thoroughly, paying special attention to the area where your surgery  will be performed.  7.  Thoroughly rinse your body with warm water from the neck down.  8.  DO NOT shower/wash with your normal soap after using and rinsing off  the CHG Soap.                9.  Pat yourself dry with a clean towel.            10.  Wear clean pajamas.            11.  Place clean sheets on your bed the night of your first shower and do not  sleep with pets. Day of Surgery : Do not apply any lotions/deodorants the morning of surgery.  Please wear clean clothes to the hospital/surgery center.  FAILURE TO FOLLOW THESE INSTRUCTIONS MAY RESULT IN THE CANCELLATION OF YOUR SURGERY PATIENT  SIGNATURE_________________________________  NURSE SIGNATURE__________________________________  ________________________________________________________________________

## 2019-05-01 ENCOUNTER — Encounter (HOSPITAL_COMMUNITY)
Admission: RE | Admit: 2019-05-01 | Discharge: 2019-05-01 | Disposition: A | Discharge status: Home / Self Care | Payer: 59 | Source: Ambulatory Visit | Attending: Obstetrics and Gynecology | Admitting: Obstetrics and Gynecology

## 2019-05-01 ENCOUNTER — Encounter (HOSPITAL_COMMUNITY): Payer: Self-pay

## 2019-05-01 ENCOUNTER — Other Ambulatory Visit: Payer: Self-pay

## 2019-05-01 DIAGNOSIS — Z01812 Encounter for preprocedural laboratory examination: Secondary | ICD-10-CM | POA: Diagnosis not present

## 2019-05-01 LAB — ABO/RH: ABO/RH(D): O POS

## 2019-05-01 LAB — CBC
HCT: 38.5 % (ref 36.0–46.0)
Hemoglobin: 12.4 g/dL (ref 12.0–15.0)
MCH: 31.2 pg (ref 26.0–34.0)
MCHC: 32.2 g/dL (ref 30.0–36.0)
MCV: 97 fL (ref 80.0–100.0)
Platelets: 229 10*3/uL (ref 150–400)
RBC: 3.97 MIL/uL (ref 3.87–5.11)
RDW: 14.6 % (ref 11.5–15.5)
WBC: 2.3 10*3/uL — ABNORMAL LOW (ref 4.0–10.5)
nRBC: 0 % (ref 0.0–0.2)

## 2019-05-02 ENCOUNTER — Other Ambulatory Visit (HOSPITAL_COMMUNITY)
Admission: RE | Admit: 2019-05-02 | Discharge: 2019-05-02 | Disposition: A | Discharge status: Home / Self Care | Payer: 59 | Source: Ambulatory Visit | Attending: Obstetrics and Gynecology | Admitting: Obstetrics and Gynecology

## 2019-05-02 DIAGNOSIS — Z20822 Contact with and (suspected) exposure to covid-19: Secondary | ICD-10-CM | POA: Insufficient documentation

## 2019-05-02 DIAGNOSIS — Z01812 Encounter for preprocedural laboratory examination: Secondary | ICD-10-CM | POA: Diagnosis not present

## 2019-05-02 LAB — SARS CORONAVIRUS 2 (TAT 6-24 HRS): SARS Coronavirus 2: NEGATIVE

## 2019-05-04 NOTE — Progress Notes (Signed)
CBC results routed to Dr. Simona Huh for review.

## 2019-05-05 ENCOUNTER — Other Ambulatory Visit (INDEPENDENT_AMBULATORY_CARE_PROVIDER_SITE_OTHER): Payer: Self-pay | Admitting: Family Medicine

## 2019-05-05 DIAGNOSIS — E559 Vitamin D deficiency, unspecified: Secondary | ICD-10-CM

## 2019-05-06 ENCOUNTER — Ambulatory Visit (HOSPITAL_BASED_OUTPATIENT_CLINIC_OR_DEPARTMENT_OTHER): Payer: No Typology Code available for payment source | Admitting: Registered Nurse

## 2019-05-06 ENCOUNTER — Encounter (HOSPITAL_BASED_OUTPATIENT_CLINIC_OR_DEPARTMENT_OTHER)
Admission: RE | Disposition: A | Discharge status: Home / Self Care | Payer: Self-pay | Source: Home / Self Care | Attending: Obstetrics and Gynecology

## 2019-05-06 ENCOUNTER — Ambulatory Visit (HOSPITAL_BASED_OUTPATIENT_CLINIC_OR_DEPARTMENT_OTHER): Payer: No Typology Code available for payment source | Admitting: Physician Assistant

## 2019-05-06 ENCOUNTER — Ambulatory Visit (HOSPITAL_BASED_OUTPATIENT_CLINIC_OR_DEPARTMENT_OTHER)
Admission: RE | Admit: 2019-05-06 | Discharge: 2019-05-07 | Disposition: A | Discharge status: Home / Self Care | Payer: No Typology Code available for payment source | Attending: Obstetrics and Gynecology | Admitting: Obstetrics and Gynecology

## 2019-05-06 ENCOUNTER — Encounter (HOSPITAL_BASED_OUTPATIENT_CLINIC_OR_DEPARTMENT_OTHER): Payer: Self-pay | Admitting: Obstetrics and Gynecology

## 2019-05-06 DIAGNOSIS — I10 Essential (primary) hypertension: Secondary | ICD-10-CM | POA: Insufficient documentation

## 2019-05-06 DIAGNOSIS — Z9884 Bariatric surgery status: Secondary | ICD-10-CM | POA: Diagnosis not present

## 2019-05-06 DIAGNOSIS — Z6838 Body mass index (BMI) 38.0-38.9, adult: Secondary | ICD-10-CM | POA: Insufficient documentation

## 2019-05-06 DIAGNOSIS — K219 Gastro-esophageal reflux disease without esophagitis: Secondary | ICD-10-CM | POA: Insufficient documentation

## 2019-05-06 DIAGNOSIS — Z90710 Acquired absence of both cervix and uterus: Secondary | ICD-10-CM | POA: Diagnosis present

## 2019-05-06 DIAGNOSIS — Z7901 Long term (current) use of anticoagulants: Secondary | ICD-10-CM | POA: Insufficient documentation

## 2019-05-06 DIAGNOSIS — E669 Obesity, unspecified: Secondary | ICD-10-CM | POA: Insufficient documentation

## 2019-05-06 DIAGNOSIS — Z86718 Personal history of other venous thrombosis and embolism: Secondary | ICD-10-CM | POA: Insufficient documentation

## 2019-05-06 DIAGNOSIS — Z8616 Personal history of COVID-19: Secondary | ICD-10-CM | POA: Insufficient documentation

## 2019-05-06 DIAGNOSIS — Z79899 Other long term (current) drug therapy: Secondary | ICD-10-CM | POA: Diagnosis not present

## 2019-05-06 DIAGNOSIS — D251 Intramural leiomyoma of uterus: Secondary | ICD-10-CM | POA: Diagnosis not present

## 2019-05-06 HISTORY — PX: LAPAROSCOPIC VAGINAL HYSTERECTOMY WITH SALPINGECTOMY: SHX6680

## 2019-05-06 LAB — TYPE AND SCREEN
ABO/RH(D): O POS
Antibody Screen: NEGATIVE

## 2019-05-06 LAB — POCT PREGNANCY, URINE: Preg Test, Ur: NEGATIVE

## 2019-05-06 SURGERY — HYSTERECTOMY, VAGINAL, LAPAROSCOPY-ASSISTED, WITH SALPINGECTOMY
Anesthesia: General | Site: Abdomen | Laterality: Bilateral

## 2019-05-06 MED ORDER — KETOROLAC TROMETHAMINE 30 MG/ML IJ SOLN
INTRAMUSCULAR | Status: DC | PRN
  Administered 2019-05-06: 30 mg via INTRAVENOUS

## 2019-05-06 MED ORDER — FENTANYL CITRATE (PF) 250 MCG/5ML IJ SOLN
INTRAMUSCULAR | Status: AC
  Filled 2019-05-06: qty 5

## 2019-05-06 MED ORDER — ACETAMINOPHEN 500 MG PO TABS
ORAL_TABLET | ORAL | Status: AC
  Filled 2019-05-06: qty 2

## 2019-05-06 MED ORDER — MIDAZOLAM HCL 5 MG/5ML IJ SOLN
INTRAMUSCULAR | Status: DC | PRN
  Administered 2019-05-06: 2 mg via INTRAVENOUS

## 2019-05-06 MED ORDER — MEPERIDINE HCL 25 MG/ML IJ SOLN: 6.2500 mg | INTRAMUSCULAR | Status: DC | PRN

## 2019-05-06 MED ORDER — LACTATED RINGERS IV SOLN: INTRAVENOUS | Status: DC

## 2019-05-06 MED ORDER — MIDAZOLAM HCL 2 MG/2ML IJ SOLN
INTRAMUSCULAR | Status: AC
  Filled 2019-05-06: qty 2

## 2019-05-06 MED ORDER — HYDROMORPHONE HCL 1 MG/ML IJ SOLN: 0.2000 mg | INTRAMUSCULAR | Status: DC | PRN

## 2019-05-06 MED ORDER — DEXAMETHASONE SODIUM PHOSPHATE 10 MG/ML IJ SOLN
INTRAMUSCULAR | Status: DC | PRN
  Administered 2019-05-06: 10 mg via INTRAVENOUS

## 2019-05-06 MED ORDER — KETOROLAC TROMETHAMINE 30 MG/ML IJ SOLN
INTRAMUSCULAR | Status: AC
  Filled 2019-05-06: qty 1

## 2019-05-06 MED ORDER — PHENYLEPHRINE 40 MCG/ML (10ML) SYRINGE FOR IV PUSH (FOR BLOOD PRESSURE SUPPORT)
PREFILLED_SYRINGE | INTRAVENOUS | Status: AC
  Filled 2019-05-06: qty 10

## 2019-05-06 MED ORDER — HYDROMORPHONE HCL 1 MG/ML IJ SOLN
INTRAMUSCULAR | Status: AC
  Filled 2019-05-06: qty 1

## 2019-05-06 MED ORDER — ONDANSETRON HCL 4 MG/2ML IJ SOLN: 4.0000 mg | Freq: Four times a day (QID) | INTRAMUSCULAR | Status: DC | PRN

## 2019-05-06 MED ORDER — ONDANSETRON HCL 4 MG/2ML IJ SOLN
INTRAMUSCULAR | Status: DC | PRN
  Administered 2019-05-06: 4 mg via INTRAVENOUS

## 2019-05-06 MED ORDER — VASOPRESSIN 20 UNIT/ML IV SOLN
INTRAVENOUS | Status: DC | PRN
  Administered 2019-05-06: 20 mL via INTRAMUSCULAR

## 2019-05-06 MED ORDER — ACETAMINOPHEN 500 MG PO TABS
1000.0000 mg | ORAL_TABLET | Freq: Once | ORAL | Status: AC
  Administered 2019-05-06: 10:00:00 1000 mg via ORAL

## 2019-05-06 MED ORDER — OXYCODONE HCL 5 MG PO TABS
ORAL_TABLET | ORAL | Status: AC
  Filled 2019-05-06: qty 1

## 2019-05-06 MED ORDER — SUGAMMADEX SODIUM 200 MG/2ML IV SOLN
INTRAVENOUS | Status: DC | PRN
  Administered 2019-05-06: 200 mg via INTRAVENOUS

## 2019-05-06 MED ORDER — BISACODYL 5 MG PO TBEC
5.0000 mg | DELAYED_RELEASE_TABLET | Freq: Every day | ORAL | Status: DC | PRN
  Filled 2019-05-06: qty 1

## 2019-05-06 MED ORDER — FENTANYL CITRATE (PF) 250 MCG/5ML IJ SOLN
INTRAMUSCULAR | Status: DC | PRN
  Administered 2019-05-06: 100 ug via INTRAVENOUS
  Administered 2019-05-06 (×3): 50 ug via INTRAVENOUS

## 2019-05-06 MED ORDER — SCOPOLAMINE 1 MG/3DAYS TD PT72
1.0000 | MEDICATED_PATCH | TRANSDERMAL | Status: DC
  Administered 2019-05-06: 10:00:00 1.5 mg via TRANSDERMAL

## 2019-05-06 MED ORDER — PANTOPRAZOLE SODIUM 40 MG PO TBEC
40.0000 mg | DELAYED_RELEASE_TABLET | Freq: Every day | ORAL | Status: DC
  Administered 2019-05-06: 21:00:00 40 mg via ORAL

## 2019-05-06 MED ORDER — BUPIVACAINE HCL (PF) 0.25 % IJ SOLN
INTRAMUSCULAR | Status: DC | PRN
  Administered 2019-05-06: 20 mL

## 2019-05-06 MED ORDER — PROMETHAZINE HCL 25 MG/ML IJ SOLN: 6.2500 mg | INTRAMUSCULAR | Status: DC | PRN

## 2019-05-06 MED ORDER — ACETAMINOPHEN 500 MG PO TABS
1000.0000 mg | ORAL_TABLET | Freq: Four times a day (QID) | ORAL | Status: DC
  Administered 2019-05-06 – 2019-05-07 (×3): 1000 mg via ORAL

## 2019-05-06 MED ORDER — PANTOPRAZOLE SODIUM 40 MG PO TBEC
DELAYED_RELEASE_TABLET | ORAL | Status: AC
  Filled 2019-05-06: qty 1

## 2019-05-06 MED ORDER — PHENYLEPHRINE HCL (PRESSORS) 10 MG/ML IV SOLN
INTRAVENOUS | Status: DC | PRN
  Administered 2019-05-06: 120 ug via INTRAVENOUS

## 2019-05-06 MED ORDER — ONDANSETRON HCL 4 MG/2ML IJ SOLN
INTRAMUSCULAR | Status: AC
  Filled 2019-05-06: qty 2

## 2019-05-06 MED ORDER — PROPOFOL 10 MG/ML IV BOLUS
INTRAVENOUS | Status: AC
  Filled 2019-05-06: qty 20

## 2019-05-06 MED ORDER — KETOROLAC TROMETHAMINE 30 MG/ML IJ SOLN: 30.0000 mg | Freq: Once | INTRAMUSCULAR | Status: AC | PRN

## 2019-05-06 MED ORDER — PROPOFOL 10 MG/ML IV BOLUS
INTRAVENOUS | Status: DC | PRN
  Administered 2019-05-06: 200 mg via INTRAVENOUS

## 2019-05-06 MED ORDER — OXYCODONE HCL 5 MG/5ML PO SOLN: 5.0000 mg | Freq: Once | ORAL | Status: AC | PRN

## 2019-05-06 MED ORDER — LIDOCAINE 2% (20 MG/ML) 5 ML SYRINGE
INTRAMUSCULAR | Status: DC | PRN
  Administered 2019-05-06: 60 mg via INTRAVENOUS

## 2019-05-06 MED ORDER — KETOROLAC TROMETHAMINE 30 MG/ML IJ SOLN
30.0000 mg | Freq: Once | INTRAMUSCULAR | Status: AC
  Administered 2019-05-06: 20:00:00 30 mg via INTRAVENOUS

## 2019-05-06 MED ORDER — SENNOSIDES-DOCUSATE SODIUM 8.6-50 MG PO TABS
1.0000 | ORAL_TABLET | Freq: Every evening | ORAL | Status: DC | PRN
  Filled 2019-05-06: qty 1

## 2019-05-06 MED ORDER — CEFAZOLIN SODIUM-DEXTROSE 2-4 GM/100ML-% IV SOLN
2.0000 g | INTRAVENOUS | Status: AC
  Administered 2019-05-06: 2 g via INTRAVENOUS

## 2019-05-06 MED ORDER — CEFAZOLIN SODIUM-DEXTROSE 2-4 GM/100ML-% IV SOLN
INTRAVENOUS | Status: AC
  Filled 2019-05-06: qty 100

## 2019-05-06 MED ORDER — ONDANSETRON HCL 4 MG PO TABS: 4.0000 mg | ORAL_TABLET | Freq: Four times a day (QID) | ORAL | Status: DC | PRN

## 2019-05-06 MED ORDER — SCOPOLAMINE 1 MG/3DAYS TD PT72
MEDICATED_PATCH | TRANSDERMAL | Status: AC
  Filled 2019-05-06: qty 1

## 2019-05-06 MED ORDER — DEXAMETHASONE SODIUM PHOSPHATE 10 MG/ML IJ SOLN
INTRAMUSCULAR | Status: AC
  Filled 2019-05-06: qty 1

## 2019-05-06 MED ORDER — HYDROMORPHONE HCL 1 MG/ML IJ SOLN
0.2500 mg | INTRAMUSCULAR | Status: DC | PRN
  Administered 2019-05-06: 15:00:00 0.25 mg via INTRAVENOUS
  Administered 2019-05-06: 0.5 mg via INTRAVENOUS
  Administered 2019-05-06 (×2): 0.25 mg via INTRAVENOUS

## 2019-05-06 MED ORDER — SIMETHICONE 80 MG PO CHEW: 80.0000 mg | CHEWABLE_TABLET | Freq: Four times a day (QID) | ORAL | Status: DC | PRN

## 2019-05-06 MED ORDER — ROCURONIUM BROMIDE 10 MG/ML (PF) SYRINGE
PREFILLED_SYRINGE | INTRAVENOUS | Status: DC | PRN
  Administered 2019-05-06: 50 mg via INTRAVENOUS

## 2019-05-06 MED ORDER — OXYCODONE HCL 5 MG PO TABS
5.0000 mg | ORAL_TABLET | Freq: Once | ORAL | Status: AC | PRN
  Administered 2019-05-06: 17:00:00 5 mg via ORAL

## 2019-05-06 MED ORDER — OXYCODONE HCL 5 MG PO TABS
5.0000 mg | ORAL_TABLET | ORAL | Status: DC | PRN
  Administered 2019-05-06 – 2019-05-07 (×3): 5 mg via ORAL

## 2019-05-06 SURGICAL SUPPLY — 42 items
APPLICATOR ARISTA FLEXITIP XL (MISCELLANEOUS) ×2 IMPLANT
BARRIER ADHS 3X4 INTERCEED (GAUZE/BANDAGES/DRESSINGS) IMPLANT
COVER BACK TABLE 60X90IN (DRAPES) ×2 IMPLANT
COVER MAYO STAND STRL (DRAPES) ×4 IMPLANT
DECANTER SPIKE VIAL GLASS SM (MISCELLANEOUS) IMPLANT
DERMABOND ADVANCED (GAUZE/BANDAGES/DRESSINGS) ×1
DERMABOND ADVANCED .7 DNX12 (GAUZE/BANDAGES/DRESSINGS) ×1 IMPLANT
DRSG OPSITE POSTOP 3X4 (GAUZE/BANDAGES/DRESSINGS) IMPLANT
DURAPREP 26ML APPLICATOR (WOUND CARE) ×2 IMPLANT
ELECT REM PT RETURN 9FT ADLT (ELECTROSURGICAL) ×2
ELECTRODE REM PT RTRN 9FT ADLT (ELECTROSURGICAL) ×1 IMPLANT
GAUZE 4X4 16PLY RFD (DISPOSABLE) ×2 IMPLANT
GLOVE BIO SURGEON STRL SZ7 (GLOVE) ×4 IMPLANT
GLOVE BIOGEL PI IND STRL 7.0 (GLOVE) ×5 IMPLANT
GLOVE BIOGEL PI INDICATOR 7.0 (GLOVE) ×5
GLOVE ECLIPSE 6.5 STRL STRAW (GLOVE) ×4 IMPLANT
HEMOSTAT ARISTA ABSORB 3G PWDR (HEMOSTASIS) ×2 IMPLANT
LEGGING LITHOTOMY PAIR STRL (DRAPES) ×2 IMPLANT
LIGASURE IMPACT 36 18CM CVD LR (INSTRUMENTS) ×2 IMPLANT
NEEDLE HYPO 22GX1.5 SAFETY (NEEDLE) ×2 IMPLANT
NS IRRIG 1000ML POUR BTL (IV SOLUTION) ×2 IMPLANT
PACK LAVH (CUSTOM PROCEDURE TRAY) ×2 IMPLANT
PACK ROBOTIC GOWN (GOWN DISPOSABLE) ×2 IMPLANT
PACK TRENDGUARD 450 HYBRID PRO (MISCELLANEOUS) ×1 IMPLANT
PROTECTOR NERVE ULNAR (MISCELLANEOUS) IMPLANT
SET IRRIG TUBING LAPAROSCOPIC (IRRIGATION / IRRIGATOR) ×2 IMPLANT
SET TUBE SMOKE EVAC HIGH FLOW (TUBING) ×2 IMPLANT
SHEARS HARMONIC ACE PLUS 36CM (ENDOMECHANICALS) ×2 IMPLANT
SUT CHROMIC 2 0 SH (SUTURE) IMPLANT
SUT CHROMIC 2 0 UR 5 27 (SUTURE) IMPLANT
SUT MNCRL AB 4-0 PS2 18 (SUTURE) ×2 IMPLANT
SUT VIC AB 0 CT1 18XCR BRD8 (SUTURE) ×2 IMPLANT
SUT VIC AB 0 CT1 36 (SUTURE) ×2 IMPLANT
SUT VIC AB 0 CT1 8-18 (SUTURE) ×2
SUT VIC AB 2-0 CT1 (SUTURE) ×2 IMPLANT
SUT VICRYL 0 UR6 27IN ABS (SUTURE) ×2 IMPLANT
SUT VICRYL 1 TIES 12X18 (SUTURE) ×2 IMPLANT
TOWEL OR 17X26 10 PK STRL BLUE (TOWEL DISPOSABLE) ×2 IMPLANT
TRAY FOLEY W/BAG SLVR 14FR (SET/KITS/TRAYS/PACK) ×2 IMPLANT
TRENDGUARD 450 HYBRID PRO PACK (MISCELLANEOUS) ×2
TROCAR BLADELESS OPT 5 100 (ENDOMECHANICALS) ×6 IMPLANT
WARMER LAPAROSCOPE (MISCELLANEOUS) ×2 IMPLANT

## 2019-05-06 NOTE — Transfer of Care (Signed)
Immediate Anesthesia Transfer of Care Note  Patient: Allison Valenzuela  Procedure(s) Performed: LAPAROSCOPIC ASSISTED VAGINAL HYSTERECTOMY WITH SALPINGECTOMY (Bilateral Abdomen)  Patient Location: PACU  Anesthesia Type:General  Level of Consciousness: sedated  Airway & Oxygen Therapy: Patient Spontanous Breathing and Patient connected to face mask oxygen  Post-op Assessment: Report given to RN and Post -op Vital signs reviewed and stable  Post vital signs: Reviewed and stable  Last Vitals:  Vitals Value Taken Time  BP    Temp    Pulse 60 05/06/19 1425  Resp    SpO2 100 % 05/06/19 1425  Vitals shown include unvalidated device data.  Last Pain:  Vitals:   05/06/19 0956  TempSrc: Oral  PainSc: 5       Patients Stated Pain Goal: 5 ([id number])  Complications: No apparent anesthesia complications

## 2019-05-06 NOTE — H&P (Signed)
History of Present Illness  General:          39 y/o female presents for preoperative examination prior to LAVH/BS.        No concerns with vaginal discharge.         LMP 04/07/2019. Heaviest day of menses is second day.        Her hematologist instructed her to stop Eliquis for surgery.        She had bloodwork 03/10/2019, Hgb 10.9, HCT 32.6, Platelets 283. Normal CMP. Hgb A1c, 4.6. Isolation Precautions:          Respiratory Illness Screening             1. Is fever present / reported?  No           2. Are respiratory illness symptom(s) present / reported?  No           3. Are other symptom(s) present / reported?  No           5. Has there been reported travel to a High Risk respiratory illness region?  Unknown           6. Has close* contact with person(s) known to have communicable illness been reported?  No        Has patient been tested for COVID-19? Yes.         Has patient received COVID-19 vaccination? no.     Current Medications  TakingXarelto(Rivaroxaban) 20 MG Tablet 1 tablet with food Orally Once a day, Notes: [facility]    Vitamin D 50 MCG (2000 UT) Capsule 1 capsule Orally Once a day    Zyrtec Allergy(Cetirizine HCl) 10 MG Tablet 1 tablet Orally Once a day    MVI    Fish Oil 1000 MG Capsule 1 capsule Orally Once a day    Antifungal Foot(Terbinafine HCl) , Notes: Puts on Left great toe    Not-TakingIbuprofen 800 MG Tablet 1 tablet with food or milk as needed Orally every 8 hours x 24 hours prior to menses    Azithromycin 250 MG Tablet 2 tablet on the first day, then 1 tablet daily for 4 days Orally Once a day    DiscontinuedPredniSONE 10 MG (21) Tablet Therapy Pack as directed, 6-5-4-3-2-1 Orally daily, Notes: Due to Covid    Albuterol Sulfate HFA 108 (90 Base) MCG/ACT Aerosol Solution 2 puffs as needed Inhalation every 6 hrs, Notes: Due to Covid    Medication List reviewed and reconciled with the patient        Past Medical History       H/o left  breast mass benign.        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 Renaldo Reel and Dr Harlow Asa (hematology) - anticoag indefinitely.        Covid infection 12/2018.            Surgical History  gastric bypass (Roux-en-Y) 2015  wisdom teeth extraction 2018      Family History  Father: deceased, murder  Mother: alive, diagnosed with Hypertension  2 brother(s) , 1 sister(s) - healthy.   denies any GYN family cancer hx.      Social History  General:         Tobacco use               cigarettes:  Never smoked             Tobacco history last updated  04/20/2019  no EXPOSURE TO PASSIVE SMOKE.        Alcohol: yes, 1-2 once a week.        Caffeine: 2 cups , coffee daily.        no Recreational drug use.        Exercise: yes, 3-4x a week.        DENTAL CARE: good.        Marital Status: married.        Children: Boys, 2, girls, 1.        OCCUPATION: employed, [employer].        Seat belt use: yes.     Gyn History  Sexual activity currently sexually active.   Periods : every month.   LMP 04-07-2019.   Birth control vasectomy.   Last pap smear date 11/04/2018-Neg/HPV neg.   Last mammogram date 12/02/2018.   Abnormal pap smear 03/21/16 - ASCUS cannot exclude HGSIL, CIN I.   Denies H/O STD.      OB History  Number of pregnancies  3.   Pregnancy # 1  live birth, vaginal delivery.   Pregnancy # 2  live birth, vaginal delivery.   Pregnancy # 3  live birth, vaginal delivery.      Allergies  Dye FDC Blue 1: rash - Allergy - Criticality Unknown  nickel: hives - Allergy - Criticality Unknown      Hospitalization/Major Diagnostic Procedure  childbirth x 3       Review of Systems  See scanned ROS form for detail Denies fever/chills, chest pain, SOB, headaches, numbness/tingling. No h/o complication with anesthesia, bleeding disorders or blood clots See HPI.    Vital Signs  Wt 242.4, Wt change 1.8 lb, Ht 67, BMI 37.96, Temp 97.4, Pulse sitting 63, BP sitting  118/80.    Physical Examination  Chaperone present:          Chaperone present  Whitfield,Dia 04/20/2019 10:32:16 AM > , for pelvic exam.   GENERAL:          Patient appears  alert and oriented , alert and oriented.          General Appearance:  well-appearing, well-developed, no acute distress , well-appearing, well-developed, no acute distress.          Speech:  clear , clear.   LUNGS:          Auscultation:  no wheezing/rhonchi/rales. CTA bilaterally.          Effort:  no respiratory distress, comfortable breathing.   HEART:          Heart sounds:  normal. RRR. no murmur.   ABDOMEN:          General:  soft nontender, nondistended, no masses , no masses tenderness or organomegaly, non distended.   FEMALE GENITOURINARY:          Cervix  visualized, healthy appearing, no discharge, no lesions.          Adnexa:  no mass, non tender.          Uterus:  normal size/shape/consistency, freely mobile, non tender.          Vagina:  pink/moist mucosa, no lesions, no abnormal discharge.          Vulva:  normal, no lesions, no skin discoloration.          Anus:  no external hemosrhoids.   EXTREMITIES:          general  no edema.  General:  No edema or calf tenderness.       Pt aware of scribe services today.    Assessments    1. Encounter for other preprocedural examination - Z01.818 (Primary)      Treatment  1. Encounter for other preprocedural examination   Notes: Pt counseled on R/B/A of LAVH/BS, including but not limited to infection, bleeding and injury to organs in the abdomen. Discussed recovery time. No additional pre-operative clearance needed at this time, no concerning medical Dx that may complicate the procedure. Pt instructed not to take Aspirin 2 days before surgery. Discussed lifting and movement restrictions after surgery. Plan for f/u 2 week post op..         Procedures  Scribe Documentation:          Attestation:  I personally scribed for Dr. Simona Huh on the date  of this appointment. Electronically signed by scribe , Onnie Boer, Deborra Medina 04/20/2019 10:28:56 AM > .            Visit Codes  99213 OV LEVEL 3.       Follow Up  2 Weeks post op

## 2019-05-06 NOTE — Anesthesia Procedure Notes (Signed)
Procedure Name: Intubation Date/Time: 05/06/2019 12:12 PM Performed by: Talbot Grumbling, CRNA Pre-anesthesia Checklist: Patient identified, Emergency Drugs available, Suction available and Patient being monitored Patient Re-evaluated:Patient Re-evaluated prior to induction Oxygen Delivery Method: Circle system utilized Preoxygenation: Pre-oxygenation with 100% oxygen Induction Type: IV induction Ventilation: Mask ventilation without difficulty Laryngoscope Size: Mac and 3 Grade View: Grade I Tube type: Oral Tube size: 7.5 mm Number of attempts: 1 Airway Equipment and Method: Stylet Placement Confirmation: ETT inserted through vocal cords under direct vision,  positive ETCO2 and breath sounds checked- equal and bilateral Secured at: 21 cm Tube secured with: Tape Dental Injury: Teeth and Oropharynx as per pre-operative assessment

## 2019-05-06 NOTE — Anesthesia Postprocedure Evaluation (Signed)
Anesthesia Post Note  Patient: Allison Valenzuela  Procedure(s) Performed: LAPAROSCOPIC ASSISTED VAGINAL HYSTERECTOMY WITH SALPINGECTOMY (Bilateral Abdomen)     Patient location during evaluation: PACU Anesthesia Type: General Level of consciousness: awake and alert, oriented and patient cooperative Pain management: pain level controlled Vital Signs Assessment: post-procedure vital signs reviewed and stable Respiratory status: spontaneous breathing, nonlabored ventilation and respiratory function stable Cardiovascular status: blood pressure returned to baseline and stable Postop Assessment: no apparent nausea or vomiting Anesthetic complications: no    Last Vitals:  Vitals:   05/06/19 0956 05/06/19 1425  BP: 131/90 (!) 151/82  Pulse: 75 60  Resp: 18 18  Temp: 36.7 C 36.5 C  SpO2: 100% 100%    Last Pain:  Vitals:   05/06/19 0956  TempSrc: Oral  PainSc: [name]

## 2019-05-06 NOTE — Brief Op Note (Signed)
05/06/2019  2:28 PM  PATIENT:  Allison Valenzuela  39 y.o. female  PRE-OPERATIVE DIAGNOSIS:  N92.0 Menorrhagia with regular cycle, Fibroids  POST-OPERATIVE DIAGNOSIS: Same  PROCEDURE:  Procedure(s) with comments: LAPAROSCOPIC ASSISTED VAGINAL HYSTERECTOMY WITH SALPINGECTOMY (Bilateral) - SITE ALSO VAGINA  SURGEON:  Surgeon(s) and Role:    [name], MD - Primary    * Janyth Pupa, DO - Assisting  PHYSICIAN ASSISTANT: None  ASSISTANTS: Dr. Nelda Marseille   ANESTHESIA:   local and general  EBL:  250 mL   BLOOD ADMINISTERED:none  DRAINS: Urinary Catheter (Foley)   LOCAL MEDICATIONS USED:  0.25% MARCAINE    and OTHER Vasopressin 20/50   SPECIMEN:  Source of Specimen:  Uterus/cervix with bialteral fallopian tubes  DISPOSITION OF SPECIMEN:  PATHOLOGY  COUNTS:  YES  TOURNIQUET:  * No tourniquets in log *  DICTATION: .Other Dictation: Dictation Number [id number]  PLAN OF CARE: Admit for overnight observation  PATIENT DISPOSITION:  PACU - hemodynamically stable.   Delay start of Pharmacological VTE agent (>24hrs) due to surgical blood loss or risk of bleeding: no

## 2019-05-06 NOTE — Interval H&P Note (Signed)
History and Physical Interval Note:  05/06/2019 11:41 AM  Allison Valenzuela  has presented today for surgery, with the diagnosis of N92.0 Menorrhagia with regular cycle.  The various methods of treatment have been discussed with the patient and family. After consideration of risks, benefits and other options for treatment, the patient has consented to  Procedure(s): LAPAROSCOPIC ASSISTED VAGINAL HYSTERECTOMY WITH SALPINGECTOMY (Bilateral) as a surgical intervention.  The patient's history has been reviewed, patient examined, no change in status, stable for surgery.  I have reviewed the patient's chart and labs.  Questions were answered to the patient's satisfaction.     [name]

## 2019-05-06 NOTE — Op Note (Signed)
NAME: Allison Valenzuela, Allison Valenzuela [facility] MEDICAL RECORD ES:[id number] ACCOUNT [id number] DATE OF BIRTH:10/15/1980 FACILITY: [facility] LOCATION: [facility] PHYSICIAN:Martinique Pizzimenti Al Decant, MD  OPERATIVE REPORT  DATE OF PROCEDURE:  05/06/2019  PREOPERATIVE DIAGNOSES:   1.  Menorrhagia. 2.  Fibroids.  POSTOPERATIVE DIAGNOSES:   1.  Menorrhagia. 2.  Fibroids.  PROCEDURES:  Laparoscopic assisted vaginal hysterectomy with salpingectomy, bilateral.  SURGEON:  [name], MD  ASSISTANT:  Dr. Janyth Pupa and technicians.  ANESTHESIA:  Local and general.  ESTIMATED BLOOD LOSS:  250 mL.  BLOOD ADMINISTERED:  None.  DRAINS:  Foley.  LOCAL:  0.25% Marcaine and vasopressin 20 and 50 mL.  SPECIMEN:  Uterus and cervix with bilateral fallopian tubes.  DISPOSITION OF SPECIMEN:  To pathology.  PATIENT DISPOSITION:  To PACU, hemodynamically stable.  FINDINGS:  Small anteverted uterus with an obvious posterior fibroid.  Normal ovaries and fallopian tubes bilaterally.  The left tube had a few small peritubal cysts.  Omental adhesions noted at the umbilicus.  No obvious hernia.  Normal liver edge and  staples embedded in the peritoneum on the patient's left side.  Normal peristalsis of ureters before and after the procedure.  DESCRIPTION OF PROCEDURE:  The patient was identified in the holding area.  She was then taken to the operating room with IV running.  She was placed in the dorsal lithotomy position and underwent general anesthesia without complication.  SCDs were on  her legs and operating.  Ancef 2 g IV was administered prior to surgery.  A timeout was performed before starting the procedure.  After the patient was prepped, the abdomen and the vulva, we then put in the uterine manipulator.  The Graves speculum was used to identify the cervix and the anterior lip of the cervix was grasped with a single-tooth tenaculum.  The Hulka uterine  manipulator was easily advanced through the cervical os and the  tenaculum was then removed.  Attention was turned to the abdomen.  Marcaine was used to inject the infraumbilical incision.  The 5 mm trocar was advanced through the abdomen on direct visualization.  Once intraabdominal access was confirmed, the CO2 gas was started on low flow and  then went to high flow.  The findings above were noted.  The patient was placed in Trendelenburg.  There were 2 more ports that were placed under direct visualization.  We transilluminated and we injected with Marcaine prior to advancing the trocars.  While we were inserting the trocars, I could not see the placement of the right ureter coming in.  It was because I was in the omental adhesion coming under it and the port was being placed on the other side of the adhesion.  It was almost like a wall  and the omentum was in between the trocars.  Once we placed our lower port in the right lower quadrant, we were able to see that that was the case.  The fallopian tube was then grasped with a million dollar grasper.  The mesosalpinx was then transected with the Harmonic scalpel.  The round ligaments were also taken down and a bladder flap was developed and the anterior cul-de-sac.  There was no  bleeding noted on the right side; however, the right ovary was close to the uterus.  No obvious scarring was noted.  The same thing was done on the opposite side.  The ovary did come off of the uterus easier.  The bladder flap was easily developed.   There was a little bleeding where the  uterine artery would be located.  That was coagulated on advanced hemostasis.  Prior to removing the fallopian tubes, we did make sure that the ureters were peristalsing on each side so we knew the location.  Attention was then turned below.  The cervix was then grasped with 2 single-tooth tenaculums.  The paracervical mucosa was then injected with vasopressin 20 and 50 circumferentially.  The Bovie cautery was used to cut too incise in the same area to   release the cervix.  I then entered the anterior cul-de-sac, pulled into the posterior fornix.  I entered the posterior cul-de-sac sharply with the curved Mayo scissors.  The long weighted speculum was then advanced.  Of note, the patient did have a  significant amount of prolapse.  The short weighted speculum did not hold back enough and the long weighted was too long and that was prior to entering the posterior cul-de-sac.  Attention was turned to the anterior cul-de-sac and the peritoneum was  easily identified and entered sharply without any concern of trauma to the bladder.  The uterosacral ligaments were then grasped with curved Heaneys and suture ligated with 0 Vicryl.  They were tagged and held on both sides.  I then used the LigaSure to  transect the cardinal ligaments on both sides.  I took a few bites on the patient's left and then at one point, we got a signal that the LigaSure was not working properly.  It said checked for extra fluid, check for staples.  There was bleeding noted at  the time, but it was not considered excessive.  What we would later found out is that area that the machine was alerting, there was a staple in that area.  So because I could not use a LigaSure  in that area, I did grasp the cardinal ligaments with the  curved Heaney, cut the tissue and suture ligated with a 0 pop.  I did 2 throws of that on the patient's left.  The LigaSure was used for the remainder of the procedure.  We transected the ligaments and met where we previously had come down to from above.   The uterus was then easily removed.  There was some bleeding noted on the patient's left hand side.  The right angle clamp was then used and a 0 stitch was thrown that showed adequate hemostasis.  The bowel was displaced and with a moistened 4 x 18,  the angle stitches were then placed with the 0 pop.  The cuff was brought together with the peritoneum with a 0 Vicryl in a continuous locked fashion.  There was no  bleeding at the cuff and it was intact.  No vaginal packing was placed due to great  hemostasis.  I did place the Foley catheter after I put in the uterine manipulator.  At the end of the case, it did appear that that was on some tension, but there was no obvious trauma to the urethra.  Attention was turned to the abdomen.  CO2 gas was used to inflate the abdomen again.  The cuff looked great.  It was hemostatic.  Of note, we did find the area for the staples.  I used the [place] grasper to grasp the staple to see how embedded it was  into the peritoneum and it took up a pretty significant area.  Because I did not want to cause any further trauma or bleeding and I did not want to have to worry with hemostasis and organs  in that area, I chose not to remove it.  These staples were  likely placed in the patient's body 20 years ago and it did not have any jagged edges and did not seem to be causing any significant trauma.  I did make sure the ureters were peristalsing bilaterally, especially on the left side and they were working.  We put Arista on the cuff after we copiously irrigated.  The trocars were then removed under direct visualization.  CO2 gas was expelled with expiratory breaths.  The incisions were then reapproximated with 4-0 Monocryl in a single interrupted fashion.  All instrument, sponge and needle counts were correct x3.  The patient tolerated the procedure well.  VN/NUANCE  D:05/06/2019 T:05/06/2019 JOB:[id number]

## 2019-05-06 NOTE — Anesthesia Preprocedure Evaluation (Addendum)
Anesthesia Evaluation  Patient identified by MRN, date of birth, ID band Patient awake    Reviewed: Allergy & Precautions, NPO status , Patient's Chart, lab work & pertinent test results  Airway Mallampati: II  TM Distance: >3 FB Neck ROM: Full    Dental no notable dental hx. 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    Neuro/Psych negative neurological ROS  negative psych ROS   GI/Hepatic Neg liver ROS, GERD  Controlled,S/p roux-en-y gastric bypass   Endo/Other  diabetesObesity BMI 38  Renal/GU negative Renal ROS  Female GU complaint Menorrhagia     Musculoskeletal negative musculoskeletal ROS (+)   Abdominal (+) + obese,   Peds negative pediatric ROS (+)  Hematology negative hematology ROS (+) hct 38.5   Anesthesia Other Findings   Reproductive/Obstetrics negative OB ROS                            Anesthesia Physical Anesthesia Plan  ASA: III  Anesthesia Plan: General   Post-op Pain Management:    Induction: Intravenous  PONV Risk Score and Plan: 4 or greater and Ondansetron, Dexamethasone, Midazolam, Scopolamine patch - Pre-op and Treatment may vary due to age or medical condition  Airway Management Planned: Oral ETT  Additional Equipment: None  Intra-op Plan:   Post-operative Plan: Extubation in OR  Informed Consent: I have reviewed the patients History and Physical, chart, labs and discussed the procedure including the risks, benefits and alternatives for the proposed anesthesia with the patient or authorized representative who has indicated his/her understanding and acceptance.     Dental advisory given  Plan Discussed with: CRNA  Anesthesia  Plan Comments:         Anesthesia Quick Evaluation

## 2019-05-07 DIAGNOSIS — D251 Intramural leiomyoma of uterus: Secondary | ICD-10-CM | POA: Diagnosis not present

## 2019-05-07 LAB — CBC WITH DIFFERENTIAL/PLATELET
Abs Immature Granulocytes: 0.01 10*3/uL (ref 0.00–0.07)
Basophils Absolute: 0 10*3/uL (ref 0.0–0.1)
Basophils Relative: 0 %
Eosinophils Absolute: 0 10*3/uL (ref 0.0–0.5)
Eosinophils Relative: 0 %
HCT: 32.9 % — ABNORMAL LOW (ref 36.0–46.0)
Hemoglobin: 10.6 g/dL — ABNORMAL LOW (ref 12.0–15.0)
Immature Granulocytes: 0 %
Lymphocytes Relative: 35 %
Lymphs Abs: 1.7 10*3/uL (ref 0.7–4.0)
MCH: 31.1 pg (ref 26.0–34.0)
MCHC: 32.2 g/dL (ref 30.0–36.0)
MCV: 96.5 fL (ref 80.0–100.0)
Monocytes Absolute: 0.3 10*3/uL (ref 0.1–1.0)
Monocytes Relative: 6 %
Neutro Abs: 2.9 10*3/uL (ref 1.7–7.7)
Neutrophils Relative %: 59 %
Platelets: 188 10*3/uL (ref 150–400)
RBC: 3.41 MIL/uL — ABNORMAL LOW (ref 3.87–5.11)
RDW: 14.2 % (ref 11.5–15.5)
WBC: 4.9 10*3/uL (ref 4.0–10.5)
nRBC: 0 % (ref 0.0–0.2)

## 2019-05-07 MED ORDER — OXYCODONE HCL 5 MG PO TABS
ORAL_TABLET | ORAL | Status: AC
  Filled 2019-05-07: qty 1

## 2019-05-07 MED ORDER — ACETAMINOPHEN 500 MG PO TABS: 1000.0000 mg | ORAL_TABLET | Freq: Three times a day (TID) | ORAL | 0 refills | Status: DC | PRN

## 2019-05-07 MED ORDER — ACETAMINOPHEN 500 MG PO TABS
ORAL_TABLET | ORAL | Status: AC
  Filled 2019-05-07: qty 2

## 2019-05-07 MED ORDER — IBUPROFEN 600 MG PO TABS: 600.0000 mg | ORAL_TABLET | Freq: Four times a day (QID) | ORAL | 1 refills | Status: DC | PRN

## 2019-05-07 MED ORDER — OXYCODONE HCL 5 MG PO TABS: 5.0000 mg | ORAL_TABLET | ORAL | 0 refills | Status: DC | PRN

## 2019-05-07 NOTE — Discharge Instructions (Signed)
Laparoscopically Assisted Vaginal Hysterectomy, Care After This sheet gives you information about how to care for yourself after your procedure. Your health care provider may also give you more specific instructions. If you have problems or questions, contact your health care provider. What can I expect after the procedure? After the procedure, it is common to have:  Soreness and numbness in your incision areas.  Abdominal pain. You will be given pain medicine to control it.  Vaginal bleeding and discharge. You will need to use a sanitary napkin after this procedure.  Sore throat from the breathing tube that was inserted during surgery. Follow these instructions at home: Medicines  Take over-the-counter and prescription medicines only as told by your health care provider.  Do not take aspirin or ibuprofen. These medicines can cause bleeding.  Do not drive or use heavy machinery while taking prescription pain medicine.  Do not drive for 24 hours if you were given a medicine to help you relax (sedative) during the procedure. Incision care   Follow instructions from your health care provider about how to take care of your incisions. Make sure you: ? Wash your hands with soap and water before you change your bandage (dressing). If soap and water are not available, use hand sanitizer. ? Change your dressing as told by your health care provider. ? Leave stitches (sutures), skin glue, or adhesive strips in place. These skin closures may need to stay in place for 2 weeks or longer. If adhesive strip edges start to loosen and curl up, you may trim the loose edges. Do not remove adhesive strips completely unless your health care provider tells you to do that.  Check your incision area every day for signs of infection. Check for: ? Redness, swelling, or pain. ? Fluid or blood. ? Warmth. ? Pus or a bad smell. Activity  Get regular exercise as told by your health care provider. You may be  told to take short walks every day and go farther each time.  Return to your normal activities as told by your health care provider. Ask your health care provider what activities are safe for you.  Do not douche, use tampons, or have sexual intercourse for at least 6 weeks, or until your health care provider gives you permission.  Do not lift anything that is heavier than 10 lb (4.5 kg), or the limit that your health care provider tells you, until he or she says that it is safe. General instructions  Do not take baths, swim, or use a hot tub until your health care provider approves. Take showers instead of baths.  Do not drive for 24 hours if you received a sedative.  Do not drive or operate heavy machinery while taking prescription pain medicine.  To prevent or treat constipation while you are taking prescription pain medicine, your health care provider may recommend that you: ? Drink enough fluid to keep your urine clear or pale yellow. ? Take over-the-counter or prescription medicines. ? Eat foods that are high in fiber, such as fresh fruits and vegetables, whole grains, and beans. ? Limit foods that are high in fat and processed sugars, such as fried and sweet foods.  Keep all follow-up visits as told by your health care provider. This is important. Contact a health care provider if:  You have signs of infection, such as: ? Redness, swelling, or pain around your incision sites. ? Fluid or blood coming from an incision. ? An incision that feels warm to the   touch. ? Pus or a bad smell coming from an incision.  Your incision breaks open.  Your pain medicine is not helping.  You feel dizzy or light-headed.  You have pain or bleeding when you urinate.  You have persistent nausea and vomiting.  You have blood, pus, or a bad-smelling discharge from your vagina. Get help right away if:  You have a fever.  You have severe abdominal pain.  You have chest pain.  You have  shortness of breath.  You faint.  You have pain, swelling, or redness in your leg.  You have heavy bleeding from your vagina. Summary  After the procedure, it is common to have abdominal pain and vaginal bleeding.  You should not drive or lift heavy objects until your health care provider says that it is safe.  Contact your health care provider if you have any symptoms of infection, excessive vaginal bleeding, nausea, vomiting, or shortness of breath. This information is not intended to replace advice given to you by your health care provider. Make sure you discuss any questions you have with your health care provider. Document Revised: 12/14/2016 Document Reviewed: 02/28/2016 Elsevier Patient Education  2020 [employer]  

## 2019-05-07 NOTE — Discharge Summary (Signed)
Physician Discharge Summary  Patient ID: Allison Valenzuela MRN: [id number] DOB/AGE: 06-07-1980 39 y.o.  Admit date: 05/06/2019 Discharge date: 05/07/2019  Admission Diagnoses:Fibroids, Menorrhagia, H/o DVT on anticoagulation  Discharge Diagnoses:  Active Problems:   S/P laparoscopic assisted vaginal hysterectomy (LAVH)   Discharged Condition: good  Hospital Course: Routine post op care.  Milestones met appropriately.  Pain well controlled.  Xarelto to be started 24 hours after surgery which will be after the pt goes home.  Consults: None  Significant Diagnostic Studies: Hemoglobin & Hematocrit     Component Value Date/Time   HGB 11.9 (L) 05/17/2019 1053   HGB 8.1 (L) 10/29/2018 0846   HCT 37.5 05/17/2019 1053   HCT 27.4 (L) 10/29/2018 0846     Treatments: surgery: As above  Discharge Exam: Blood pressure 131/65, pulse (!) 56, temperature 99.2 F (37.3 C), resp. rate 18, height 5' 7"  (1.702 m), weight 111.2 kg, last menstrual period 05/05/2019, SpO2 97 %.  Gen:  NAD, A&O x 3 Abd:  Soft, slightly distended, incisions intact Ext:  No calf tenderness or swelling  Disposition: Discharge disposition: 01-Home or Self Care       Discharge Instructions     Call MD for:  difficulty breathing, headache or visual disturbances   Complete by: As directed    Call MD for:  extreme fatigue   Complete by: As directed    Call MD for:  persistant dizziness or light-headedness   Complete by: As directed    Call MD for:  persistant nausea and vomiting   Complete by: As directed    Call MD for:  redness, tenderness, or signs of infection (pain, swelling, redness, odor or green/yellow discharge around incision site)   Complete by: As directed    Call MD for:  severe uncontrolled pain   Complete by: As directed    Call MD for:  temperature >100.4   Complete by: As directed    Diet - low sodium heart healthy   Complete by: As directed    Discharge wound care:   Complete by: As  directed    Keep incisions clean and dry.  Do not pull off the glue over the incisions.   Increase activity slowly   Complete by: As directed    Lifting restrictions   Complete by: As directed    No heavy lifting greater than 15 pounds.   Sexual Activity Restrictions   Complete by: As directed    Pelvic rest x 6 weeks.      Allergies as of 05/07/2019       Reactions   Dye Fdc Blue 1 [brilliant Blue Fcf] Itching, Rash   Nickel Itching, Rash        Medication List     TAKE these medications    acetaminophen 500 MG tablet Commonly known as: TYLENOL Take 2 tablets (1,000 mg total) by mouth every 8 (eight) hours as needed.   cetirizine-pseudoephedrine 5-120 MG tablet Commonly known as: ZYRTEC-D Take 1 tablet by mouth 2 (two) times daily.   ferrous sulfate 325 (65 FE) MG tablet Take 325 mg by mouth every other day.   Fish Oil 1000 MG Caps Take 1,000 mg by mouth daily.   ibuprofen 600 MG tablet Commonly known as: ADVIL Take 1 tablet (600 mg total) by mouth every 6 (six) hours as needed.   multivitamin with minerals Tabs tablet Take 1 tablet by mouth daily.   oxyCODONE 5 MG immediate release tablet Commonly known as: Oxy IR/ROXICODONE  Take 1 tablet (5 mg total) by mouth every 4 (four) hours as needed for moderate pain.   vitamin B-12 1000 MCG tablet Commonly known as: CYANOCOBALAMIN Take 2,000 mcg by mouth daily.   Vitamin D (Ergocalciferol) 1.25 MG (50000 UNIT) Caps capsule Commonly known as: DRISDOL Take 1 capsule (50,000 Units total) by mouth every 7 (seven) days.   Xarelto 20 MG Tabs tablet Generic drug: rivaroxaban Take 20 mg by mouth daily with supper.               Discharge Care Instructions  (From admission, onward)           Start     Ordered   05/07/19 0000  Discharge wound care:    Comments: Keep incisions clean and dry.  Do not pull off the glue over the incisions.   05/07/19 9675           Follow-up Information      [name], MD. Schedule an appointment as soon as possible for a visit in 2 week(s).   Specialty: Obstetrics and Gynecology Why: Post op follow up Contact information: [address] [place] [place] [address] [phone]            Signed: [name] 05/07/2019, 8:07 AM

## 2019-05-08 LAB — SURGICAL PATHOLOGY

## 2019-05-15 ENCOUNTER — Ambulatory Visit (INDEPENDENT_AMBULATORY_CARE_PROVIDER_SITE_OTHER): Payer: 59 | Admitting: *Deleted

## 2019-05-15 ENCOUNTER — Other Ambulatory Visit: Payer: Self-pay

## 2019-05-15 DIAGNOSIS — B351 Tinea unguium: Secondary | ICD-10-CM

## 2019-05-15 NOTE — Progress Notes (Signed)
Patient presents today for the 2nd laser treatment. Diagnosed with mycotic nail infection by Dr. Jacqualyn Posey.   Toenail most affected hallux nail right. There has been no change as of yet, but patient says its not any worse.  All other systems are negative.  Nails were filed thin. Laser therapy was administered to 1st toenails right and patient tolerated the treatment well. All safety precautions were in place.    Follow up in 4 weeks for laser # 3.

## 2019-05-17 ENCOUNTER — Emergency Department (HOSPITAL_COMMUNITY): Payer: No Typology Code available for payment source

## 2019-05-17 ENCOUNTER — Encounter (HOSPITAL_COMMUNITY): Payer: Self-pay | Admitting: Emergency Medicine

## 2019-05-17 ENCOUNTER — Emergency Department (HOSPITAL_BASED_OUTPATIENT_CLINIC_OR_DEPARTMENT_OTHER)
Admit: 2019-05-17 | Discharge: 2019-05-17 | Disposition: A | Discharge status: Home / Self Care | Payer: No Typology Code available for payment source | Attending: Emergency Medicine | Admitting: Emergency Medicine

## 2019-05-17 ENCOUNTER — Other Ambulatory Visit: Payer: Self-pay

## 2019-05-17 ENCOUNTER — Emergency Department (HOSPITAL_COMMUNITY)
Admission: EM | Admit: 2019-05-17 | Discharge: 2019-05-17 | Disposition: A | Discharge status: Home / Self Care | Payer: No Typology Code available for payment source | Attending: Emergency Medicine | Admitting: Emergency Medicine

## 2019-05-17 DIAGNOSIS — M7989 Other specified soft tissue disorders: Secondary | ICD-10-CM

## 2019-05-17 DIAGNOSIS — I10 Essential (primary) hypertension: Secondary | ICD-10-CM | POA: Diagnosis not present

## 2019-05-17 DIAGNOSIS — Z79899 Other long term (current) drug therapy: Secondary | ICD-10-CM | POA: Diagnosis not present

## 2019-05-17 DIAGNOSIS — R52 Pain, unspecified: Secondary | ICD-10-CM

## 2019-05-17 DIAGNOSIS — X509XXA Other and unspecified overexertion or strenuous movements or postures, initial encounter: Secondary | ICD-10-CM | POA: Diagnosis not present

## 2019-05-17 DIAGNOSIS — M25571 Pain in right ankle and joints of right foot: Secondary | ICD-10-CM | POA: Insufficient documentation

## 2019-05-17 DIAGNOSIS — R2241 Localized swelling, mass and lump, right lower limb: Secondary | ICD-10-CM | POA: Insufficient documentation

## 2019-05-17 DIAGNOSIS — Y999 Unspecified external cause status: Secondary | ICD-10-CM | POA: Insufficient documentation

## 2019-05-17 DIAGNOSIS — R209 Unspecified disturbances of skin sensation: Secondary | ICD-10-CM | POA: Insufficient documentation

## 2019-05-17 DIAGNOSIS — Y939 Activity, unspecified: Secondary | ICD-10-CM | POA: Insufficient documentation

## 2019-05-17 DIAGNOSIS — Y929 Unspecified place or not applicable: Secondary | ICD-10-CM | POA: Insufficient documentation

## 2019-05-17 DIAGNOSIS — M25561 Pain in right knee: Secondary | ICD-10-CM | POA: Diagnosis not present

## 2019-05-17 DIAGNOSIS — Z7901 Long term (current) use of anticoagulants: Secondary | ICD-10-CM | POA: Diagnosis not present

## 2019-05-17 LAB — CBC WITH DIFFERENTIAL/PLATELET
Abs Immature Granulocytes: 0.01 10*3/uL (ref 0.00–0.07)
Basophils Absolute: 0 10*3/uL (ref 0.0–0.1)
Basophils Relative: 1 %
Eosinophils Absolute: 0.1 10*3/uL (ref 0.0–0.5)
Eosinophils Relative: 4 %
HCT: 37.5 % (ref 36.0–46.0)
Hemoglobin: 11.9 g/dL — ABNORMAL LOW (ref 12.0–15.0)
Immature Granulocytes: 0 %
Lymphocytes Relative: 44 %
Lymphs Abs: 1.3 10*3/uL (ref 0.7–4.0)
MCH: 30.9 pg (ref 26.0–34.0)
MCHC: 31.7 g/dL (ref 30.0–36.0)
MCV: 97.4 fL (ref 80.0–100.0)
Monocytes Absolute: 0.2 10*3/uL (ref 0.1–1.0)
Monocytes Relative: 7 %
Neutro Abs: 1.3 10*3/uL — ABNORMAL LOW (ref 1.7–7.7)
Neutrophils Relative %: 44 %
Platelets: 237 10*3/uL (ref 150–400)
RBC: 3.85 MIL/uL — ABNORMAL LOW (ref 3.87–5.11)
RDW: 14.2 % (ref 11.5–15.5)
WBC: 3 10*3/uL — ABNORMAL LOW (ref 4.0–10.5)
nRBC: 0 % (ref 0.0–0.2)

## 2019-05-17 LAB — BASIC METABOLIC PANEL
Anion gap: 7 (ref 5–15)
BUN: 15 mg/dL (ref 6–20)
CO2: 25 mmol/L (ref 22–32)
Calcium: 8.7 mg/dL — ABNORMAL LOW (ref 8.9–10.3)
Chloride: 106 mmol/L (ref 98–111)
Creatinine, Ser: 0.74 mg/dL (ref 0.44–1.00)
GFR calc Af Amer: >60 mL/min (ref >60)
GFR calc non Af Amer: >60 mL/min (ref >60)
Glucose, Bld: 94 mg/dL (ref 70–99)
Potassium: 3.9 mmol/L (ref 3.5–5.1)
Sodium: 138 mmol/L (ref 135–145)

## 2019-05-17 MED ORDER — IBUPROFEN 200 MG PO TABS: 600.0000 mg | ORAL_TABLET | Freq: Once | ORAL | Status: DC

## 2019-05-17 NOTE — ED Triage Notes (Signed)
Pt reports that had hysterectomy last week. Reports was off her blood thinner-Xeralto for 3 days. Noticed some swelling and tingling feeling in right leg esp posterior knee. Hx DVTs.

## 2019-05-17 NOTE — ED Provider Notes (Signed)
[facility] DEPT Provider Note   CSN: [id number] Arrival date & time: 05/17/19  [id number]     History Chief Complaint  Patient presents with  . Leg Swelling    Allison Valenzuela is a 39 y.o. 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  Patient had hysterectomy on 05/06/2019 with Dr. Margie Billet.  She stopped her Xarelto x3 days before the procedure and resumed 24 hours after.  Today she is also describing decreased sensation in her right leg and intermittent right knee pain. She describes the knee pain as a mild ache. Pain is worse with movement and she rates it 2/10 in severity. She has not taken any over-the-counter medications for symptoms prior to arrival.  She did twist her ankle x 2 weeks ago that she describes as ankle eversion. She did not fall to the ground. She has been able to walk and bear weight on right lower extremity without difficulty. She has been wearing an ace wrap and elevating the ankle with significant symptom improvement. She denies any fever, chills, chest pain, shortness of breath, upper extremity swelling, numbness or tingling tingling in right lower extremity. Besides the right ankle eversion she denies any other fall or trauma to right leg.   Past Medical History:  Diagnosis Date  . Acid reflux   . Anemia   . Back pain   . Bilateral bunions   . Chest pain   . Constipation   . DVT (deep venous thrombosis) (HCC)    right leg  . Dyspnea   . Gestational diabetes   . HTN (hypertension)   . Lactose intolerance   . Leg edema   . Vitamin D deficiency     Patient Active Problem List   Diagnosis Date Noted  . S/P laparoscopic assisted vaginal hysterectomy (LAVH) 05/06/2019  . Menorrhagia with regular cycle 10/27/2018  . Iron deficiency anemia due to chronic blood  loss 10/27/2018  . History of DVT of lower extremity 10/27/2018  . B12 deficiency 10/27/2018  . Obesity (BMI 30-39.9) 10/27/2018  . Other insomnia 03/17/2018  . Vitamin D deficiency 02/19/2018  . Insulin resistance 02/19/2018  . Class 2 severe obesity with serious comorbidity and body mass index (BMI) of 36.0 to 36.9 in adult [facility]) 02/19/2018  . Ingrown toenail 09/12/2017  . Onychodystrophy 09/12/2017    Past Surgical History:  Procedure Laterality Date  . LAPAROSCOPIC VAGINAL HYSTERECTOMY WITH SALPINGECTOMY Bilateral 05/06/2019   Procedure: LAPAROSCOPIC ASSISTED VAGINAL HYSTERECTOMY WITH SALPINGECTOMY;  Surgeon: [name], MD;  Location: [facility];  Service: Gynecology;  Laterality: Bilateral;  SITE ALSO VAGINA  . ROUX-EN-Y PROCEDURE    . TONSILLECTOMY       OB History    Gravida  3   Para      Term      Preterm      AB      Living  3     SAB      TAB      Ectopic      Multiple      Live Births              Family History  Problem Relation Age of Onset  . Hypertension Mother   . Heart disease Mother   . Thyroid disease Mother   . Obesity Mother  Social History   Tobacco Use  . Smoking status: Never Smoker  . Smokeless tobacco: Never Used  Substance Use Topics  . Alcohol use: Not Currently  . Drug use: Never    Home Medications Prior to Admission medications   Medication Sig Start Date End Date Taking? Authorizing Provider  acetaminophen (TYLENOL) 500 MG tablet Take 2 tablets (1,000 mg total) by mouth every 8 (eight) hours as needed. 05/07/19   [name], MD  cetirizine-pseudoephedrine (ZYRTEC-D) 5-120 MG tablet Take 1 tablet by mouth 2 (two) times daily.    [provider]  ferrous sulfate 325 (65 FE) MG tablet Take 325 mg by mouth every other day.    [provider]  ibuprofen (ADVIL) 600 MG tablet Take 1 tablet (600 mg total) by mouth every 6 (six) hours as needed. 05/07/19   [name], MD   Multiple Vitamin (MULTIVITAMIN WITH MINERALS) TABS tablet Take 1 tablet by mouth daily.    [provider]  Omega-3 Fatty Acids (FISH OIL) 1000 MG CAPS Take 1,000 mg by mouth daily.    [provider]  oxyCODONE (OXY IR/ROXICODONE) 5 MG immediate release tablet Take 1 tablet (5 mg total) by mouth every 4 (four) hours as needed for moderate pain. 05/07/19   [name], MD  rivaroxaban (XARELTO) 20 MG TABS tablet Take 20 mg by mouth daily with supper.    [provider]  vitamin B-12 (CYANOCOBALAMIN) 1000 MCG tablet Take 2,000 mcg by mouth daily.    [provider]  Vitamin D, Ergocalciferol, (DRISDOL) 1.25 MG (50000 UNIT) CAPS capsule Take 1 capsule (50,000 Units total) by mouth every 7 (seven) days. 04/13/19   Eber Jones, MD    Allergies    Dye fdc blue 1 [brilliant blue fcf] and Nickel  Review of Systems   Review of Systems All other systems are reviewed and are negative for acute change except as noted in the HPI.  Physical Exam Updated Vital Signs BP 128/88   Pulse 70   Temp 98 F (36.7 C) (Oral)   Resp 18   LMP 05/05/2019 (Exact Date)   SpO2 100%   Physical Exam Vitals and nursing note reviewed.  Constitutional:      General: She is not in acute distress.    Appearance: She is not ill-appearing.  HENT:     Head: Normocephalic and atraumatic.     Right Ear: Tympanic membrane and external ear normal.     Left Ear: Tympanic membrane and external ear normal.     Nose: Nose normal.     Mouth/Throat:     Mouth: Mucous membranes are moist.     Pharynx: Oropharynx is clear.  Eyes:     General: No scleral icterus.       Right eye: No discharge.        Left eye: No discharge.     Extraocular Movements: Extraocular movements intact.     Conjunctiva/sclera: Conjunctivae normal.     Pupils: Pupils are equal, round, and reactive to light.  Neck:     Vascular: No JVD.  Cardiovascular:     Rate and Rhythm: Normal rate and regular  rhythm.     Pulses: Normal pulses.          Radial pulses are 2+ on the right side and 2+ on the left side.       Dorsalis pedis pulses are 2+ on the right side and 2+ on the left side.  Heart sounds: Normal heart sounds.  Pulmonary:     Comments: Lungs clear to auscultation in all fields. Symmetric chest rise. No wheezing, rales, or rhonchi. Abdominal:     Comments: Abdomen is soft, non-distended, and non-tender in all quadrants. No rigidity, no guarding. No peritoneal signs.  Musculoskeletal:        General: Normal range of motion.     Cervical back: Normal range of motion.     Right hip: Normal.     Right knee: Normal.     Right lower leg: No swelling. No edema.     Left lower leg: No edema.     Comments: There is mild swelling and tenderness over the lateral malleolus .No overt deformity. No tenderness over the medial aspect of the ankle. The fifth metatarsal is not tender. The ankle joint is intact without excessive opening on stressing. No break in skin. Good pedal pulse and cap refill of all toes. Wiggling toes without difficulty.    Right lower extremity is neurovascularly intact distally. Compartments soft above and below affected joint.    Skin:    General: Skin is warm and dry.     Capillary Refill: Capillary refill takes less than 2 seconds.  Neurological:     Mental Status: She is oriented to person, place, and time.     GCS: GCS eye subscore is 4. GCS verbal subscore is 5. GCS motor subscore is 6.     Comments: Fluent speech, no facial droop.  Psychiatric:        Behavior: Behavior normal.     ED Results / Procedures / Treatments   Labs (all labs ordered are listed, but only abnormal results are displayed) Labs Reviewed  BASIC METABOLIC PANEL - Abnormal; Notable for the following components:      Result Value   Calcium 8.7 (*)    All other components within normal limits  CBC WITH DIFFERENTIAL/PLATELET - Abnormal; Notable for the following components:   WBC  3.0 (*)    RBC 3.85 (*)    Hemoglobin 11.9 (*)    Neutro Abs 1.3 (*)    All other components within normal limits    EKG None  Radiology DG Knee Complete 4 Views Right  Result Date: 05/17/2019 CLINICAL DATA:  Right knee swelling and tingling feeling since hysterectomy last week. History of DVT. No reported injury. EXAM: RIGHT KNEE - COMPLETE 4+ VIEW COMPARISON:  None. FINDINGS: No evidence of fracture, dislocation, or joint effusion. No evidence of arthropathy or other focal bone abnormality. Soft tissues are unremarkable. IMPRESSION: Normal right knee radiographs. Electronically Signed   By: Ilona Sorrel M.D.   On: 05/17/2019 11:56   VAS [name] LOWER EXTREMITY VENOUS (DVT) (ONLY [facility] & [facility] 7a-7p)  Result Date: 05/17/2019  Lower Venous DVTStudy Indications: Swelling.  Risk Factors: None identified. Limitations: Body habitus and poor ultrasound/tissue interface. Comparison Study: No prior studies. Performing Technologist: Oliver Hum RVT  Examination Guidelines: A complete evaluation includes B-mode imaging, spectral Doppler, color Doppler, and power Doppler as needed of all accessible portions of each vessel. Bilateral testing is considered an integral part of a complete examination. Limited examinations for reoccurring indications may be performed as noted. The reflux portion of the exam is performed with the patient in reverse Trendelenburg.  +---------+---------------+---------+-----------+----------+--------------+ RIGHT    CompressibilityPhasicitySpontaneityPropertiesThrombus Aging +---------+---------------+---------+-----------+----------+--------------+ CFV      Full           Yes      Yes                                 +---------+---------------+---------+-----------+----------+--------------+  SFJ      Full                                                        +---------+---------------+---------+-----------+----------+--------------+ FV Prox  Full                                                         +---------+---------------+---------+-----------+----------+--------------+ FV Mid   Full                                                        +---------+---------------+---------+-----------+----------+--------------+ FV Distal               Yes      Yes                                 +---------+---------------+---------+-----------+----------+--------------+ PFV      Full                                                        +---------+---------------+---------+-----------+----------+--------------+ POP      Full           Yes      Yes                                 +---------+---------------+---------+-----------+----------+--------------+ PTV      Full                                                        +---------+---------------+---------+-----------+----------+--------------+ PERO     Full                                                        +---------+---------------+---------+-----------+----------+--------------+   +----+---------------+---------+-----------+----------+--------------+ LEFTCompressibilityPhasicitySpontaneityPropertiesThrombus Aging +----+---------------+---------+-----------+----------+--------------+ CFV Full           Yes      Yes                                 +----+---------------+---------+-----------+----------+--------------+     Summary: RIGHT: - There is no evidence of deep vein thrombosis in the lower extremity. However, portions of this examination were limited- see technologist comments above.  - No cystic structure found in the popliteal fossa.  LEFT: - No evidence of common femoral vein obstruction.  *See table(s) above for measurements  and observations. Electronically signed by Monica Martinez MD on 05/17/2019 at 23:06:12 AM.    Final     Procedures Procedures (including critical care time)  Medications Ordered in ED Medications - No data to display  ED Course  I  have reviewed the triage vital signs and the nursing notes.  Pertinent labs & imaging results that were available during my care of the patient were reviewed by me and considered in my medical decision making (see chart for details).    MDM Rules/Calculators/A&P                      History provided by patient with additional history obtained from chart review.    Patient seen and examined. Patient presents awake, alert, hemodynamically stable, afebrile, non toxic.  No tachycardia or hypoxia.  On exam her lungs are clear to auscultation all fields.  She has normal work of breathing.  She does have very mild swelling noted to the lateral malleolus of right ankle.  No obvious deformity or injury.  She is able to walk and bear weight without any difficulty.  No other edema or swelling is noted to right leg.  Legs are symmetrical in appearance.  Right lower extremity is neurovascularly intact.  Basic labs checked.  No severe electrolyte derangement, no renal insufficiency.  CBC with leukopenia that is consistent with her baseline.  Hemoglobin 11.9 appears improved compared to recent labs x10 days ago.  Patient states she currently sees a hematologist for her leukopenia. Right lower extremity US is negative for DVT. Xray of right knee viewed by me shows. Xray of right knee viewed by me shows no fractures of dislocations. Ibuprofren was ordered in triage however I cancelled the order as patient is anticoagulated. She did not receive ibuprofen.  With reassuring exam and work-up today will discharge home with symptomatic care.  Recommend she follow-up with orthopedics if she continues to have knee pain.  Also recommend she follow-up with PCP to have repeat ultrasound if she continues to notice swelling in her right leg. The patient appears reasonably screened and/or stabilized for discharge and I doubt any other medical condition or other [facility] requiring further screening, evaluation, or treatment in the ED at  this time prior to discharge. The patient is safe for discharge with strict return precautions discussed. Findings and plan of care discussed with supervising physician Dr. Langston Masker who agrees with plan of care.    Portions of this note were generated with [employer]. Dictation errors may occur despite best attempts at proofreading.   Final Clinical Impression(s) / ED Diagnoses Final diagnoses:  Leg swelling    Rx / DC Orders ED Discharge Orders    None       Flint Melter 05/17/19 1312    Wyvonnia Dusky, MD 05/17/19 1754

## 2019-05-17 NOTE — Discharge Instructions (Addendum)
You have been seen today for right leg pain and swelling. Please read and follow all provided instructions. Return to the emergency room for worsening condition or new concerning symptoms.    Your x-ray did not show any broken bones or dislocations. The ultrasound of your leg did not show any blood clots.  1. Medications:  Recommend you take Tylenol as needed for pain.  Avoid anti-inflammatories as you are on a blood thinner.  Continue usual home medications Take medications as prescribed. Please review all of the medicines and only take them if you do not have an allergy to them.   2. Treatment: rest.  Elevate your leg to help with swelling.  3. Follow Up:  Please follow up with primary care provider in 2 to 5 days if you continue to have symptoms.  As we discussed your primary care doctor might repeat an ultrasound of your leg if they feel it is necessary.  -You can also follow-up with orthopedics for your knee pain if needed.  The on-call orthopedist is Dr. Mardelle Matte.  He can call his office to schedule follow-up.   ?

## 2019-05-17 NOTE — Progress Notes (Signed)
Right lower extremity venous duplex has been completed. Preliminary results can be found in CV Proc through chart review.  Results were given to Dr. Langston Masker.  05/17/19 10:30 AM Allison Valenzuela RVT

## 2019-05-18 ENCOUNTER — Other Ambulatory Visit: Payer: 59

## 2019-06-08 ENCOUNTER — Other Ambulatory Visit: Payer: 59

## 2019-06-10 ENCOUNTER — Ambulatory Visit: Payer: 59

## 2019-06-10 ENCOUNTER — Other Ambulatory Visit: Payer: Self-pay | Admitting: Obstetrics and Gynecology

## 2019-06-10 ENCOUNTER — Ambulatory Visit
Admission: RE | Admit: 2019-06-10 | Discharge: 2019-06-10 | Disposition: A | Discharge status: Home / Self Care | Payer: 59 | Source: Ambulatory Visit | Attending: Obstetrics and Gynecology | Admitting: Obstetrics and Gynecology

## 2019-06-10 ENCOUNTER — Other Ambulatory Visit: Payer: Self-pay

## 2019-06-10 DIAGNOSIS — N6489 Other specified disorders of breast: Secondary | ICD-10-CM

## 2019-06-12 ENCOUNTER — Other Ambulatory Visit: Payer: 59

## 2019-07-03 ENCOUNTER — Ambulatory Visit (INDEPENDENT_AMBULATORY_CARE_PROVIDER_SITE_OTHER): Payer: No Typology Code available for payment source | Admitting: *Deleted

## 2019-07-03 ENCOUNTER — Other Ambulatory Visit: Payer: Self-pay

## 2019-07-03 DIAGNOSIS — B351 Tinea unguium: Secondary | ICD-10-CM

## 2019-07-03 NOTE — Progress Notes (Signed)
Patient presents today for the 3rd laser treatment. Diagnosed with mycotic nail infection by Dr. Jacqualyn Posey.   Toenail most affected hallux nail right. Her nail is looking so much better and she is very pleased.  All other systems are negative.  Nails were filed thin. Laser therapy was administered to 1st toenails right and patient tolerated the treatment well. All safety precautions were in place.    Follow up in 6 weeks for laser # 4.

## 2019-08-14 ENCOUNTER — Other Ambulatory Visit: Payer: Self-pay

## 2019-08-14 ENCOUNTER — Ambulatory Visit (INDEPENDENT_AMBULATORY_CARE_PROVIDER_SITE_OTHER): Payer: No Typology Code available for payment source | Admitting: *Deleted

## 2019-08-14 DIAGNOSIS — B351 Tinea unguium: Secondary | ICD-10-CM

## 2019-08-14 NOTE — Progress Notes (Signed)
Patient presents today for the 4th laser treatment. Diagnosed with mycotic nail infection by Dr. Jacqualyn Posey.   Toenail most affected hallux nail right. Her nail is looking so much better and she is very pleased.  All other systems are negative.  Nails were filed thin. Laser therapy was administered to 1st toenails right and patient tolerated the treatment well. All safety precautions were in place.    Follow up in 6 weeks for laser # 5.

## 2019-09-18 ENCOUNTER — Other Ambulatory Visit: Payer: Self-pay | Admitting: Surgery

## 2019-09-25 ENCOUNTER — Ambulatory Visit (INDEPENDENT_AMBULATORY_CARE_PROVIDER_SITE_OTHER): Payer: No Typology Code available for payment source | Admitting: *Deleted

## 2019-09-25 ENCOUNTER — Other Ambulatory Visit: Payer: Self-pay

## 2019-09-25 DIAGNOSIS — B351 Tinea unguium: Secondary | ICD-10-CM

## 2019-09-25 NOTE — Progress Notes (Signed)
Patient presents today for the 5th laser treatment. Diagnosed with mycotic nail infection by Dr. Jacqualyn Posey.   Toenail most affected hallux nail right. She is pleased with how her nail is looking.  All other systems are negative.  Nails were filed thin. Laser therapy was administered to 1st toenails right and patient tolerated the treatment well. All safety precautions were in place.    Follow up in 8 weeks for laser # 6.

## 2019-09-28 ENCOUNTER — Other Ambulatory Visit: Payer: No Typology Code available for payment source

## 2019-10-19 ENCOUNTER — Ambulatory Visit (INDEPENDENT_AMBULATORY_CARE_PROVIDER_SITE_OTHER): Payer: No Typology Code available for payment source

## 2019-10-19 ENCOUNTER — Other Ambulatory Visit: Payer: Self-pay

## 2019-10-19 ENCOUNTER — Ambulatory Visit (INDEPENDENT_AMBULATORY_CARE_PROVIDER_SITE_OTHER): Payer: No Typology Code available for payment source | Admitting: Podiatry

## 2019-10-19 ENCOUNTER — Ambulatory Visit: Payer: No Typology Code available for payment source | Admitting: Podiatry

## 2019-10-19 DIAGNOSIS — M21611 Bunion of right foot: Secondary | ICD-10-CM | POA: Diagnosis not present

## 2019-10-19 DIAGNOSIS — M79671 Pain in right foot: Secondary | ICD-10-CM | POA: Diagnosis not present

## 2019-10-19 NOTE — Patient Instructions (Addendum)

## 2019-10-22 NOTE — Progress Notes (Signed)
Subjective: 39 year old female presents the office today for concerns of a painful bunion on the right foot this was discussed surgical intervention for her.  She states that she tried shoe modifications, offloading padding with insignificant improvement.  She is currently undergoing laser therapy for nail fungus and has been doing well with this.  She has no other concerns today. Denies any systemic complaints such as fevers, chills, nausea, vomiting. No acute changes since last appointment, and no other complaints at this time.   Objective: AAO x3, NAD DP/PT pulses palpable bilaterally, CRT less than 3 seconds Significant bunion form is present to right foot and there is hypermobility present of the first ray.  There is tenderness along the first MPJ as well as the medial first metatarsal head.  There is no pain or crepitation with first MPJ range of motion.  MMT 5/5. No pain with calf compression, swelling, warmth, erythema  Assessment: 39 year old female with significant symptomatic bunion deformity right side  Plan: -All treatment options discussed with the patient including all alternatives, risks, complications.  -In regards to the nail fungus continue laser therapy.   -X-rays obtained reviewed.  Significant bunion is present on the right foot.   -We discussed the conservative as well as surgical treatment options this time just proceed with surgical intervention.  Discussed with her right foot Lapidus bunionectomy.  We discussed the surgery as well as postoperative course and she wishes to proceed with this. -The incision placement as well as the postoperative course was discussed with the patient. I discussed risks of the surgery which include, but not limited to, infection, bleeding, pain, swelling, need for further surgery, delayed or nonhealing, painful or ugly scar, numbness or sensation changes, over/under correction, recurrence, transfer lesions, further deformity, hardware failure,  DVT/PE, loss of toe/foot. Patient understands these risks and wishes to proceed with surgery. The surgical consent was reviewed with the patient all 3 pages were signed. No promises or guarantees were given to the outcome of the procedure. All questions were answered to the best of my ability. Before the surgery the patient was encouraged to call the office if there is any further questions. The surgery will be performed at the [facility] on an outpatient basis. CAM boot dispensed for postoperative use -We will need clearance from her hematologist given history of DVT will need however off of blood thinners for the surgery. -Patient encouraged to call the office with any questions, concerns, change in symptoms.   Trula Slade DPM

## 2019-10-24 IMAGING — MG DIGITAL DIAGNOSTIC BILATERAL MAMMOGRAM WITH TOMO AND CAD
8 series · 8 of 24 positions shown · non-contrast
Comparison: Bilateral mammogram February 2014 performed at an
outside institution.

CLINICAL DATA: 37-year-old patient presents for mammogram.
Recently, she felt a lump and tenderness near the left nipple.
Currently, the patient states that she has no lump or tenderness in
either breast. Her recent symptoms have completely resolved.

EXAM:
DIGITAL DIAGNOSTIC BILATERAL MAMMOGRAM WITH CAD AND TOMO

[L CC synth-2D]
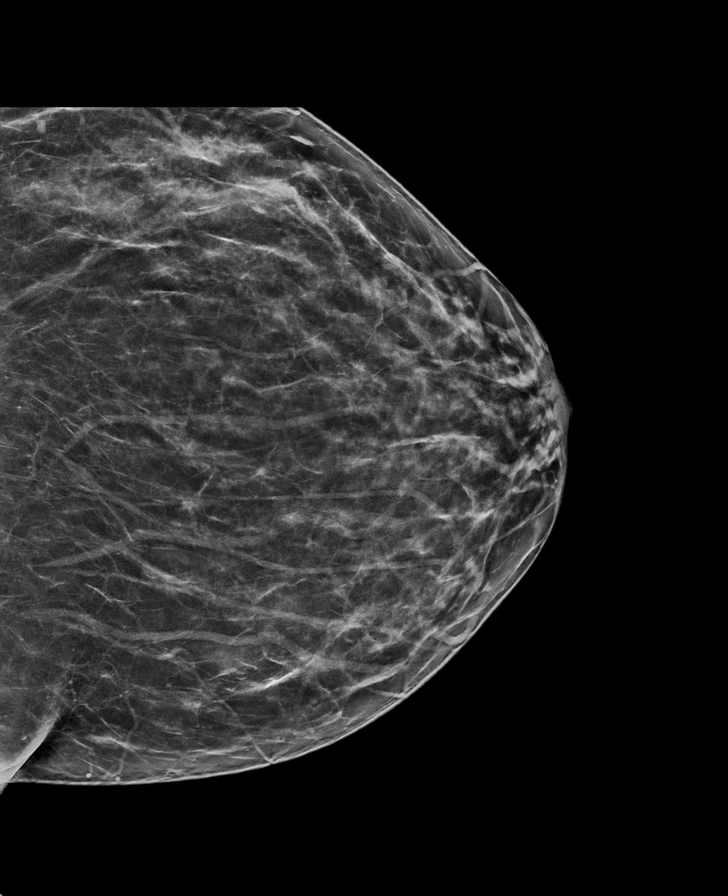

[R CC synth-2D]
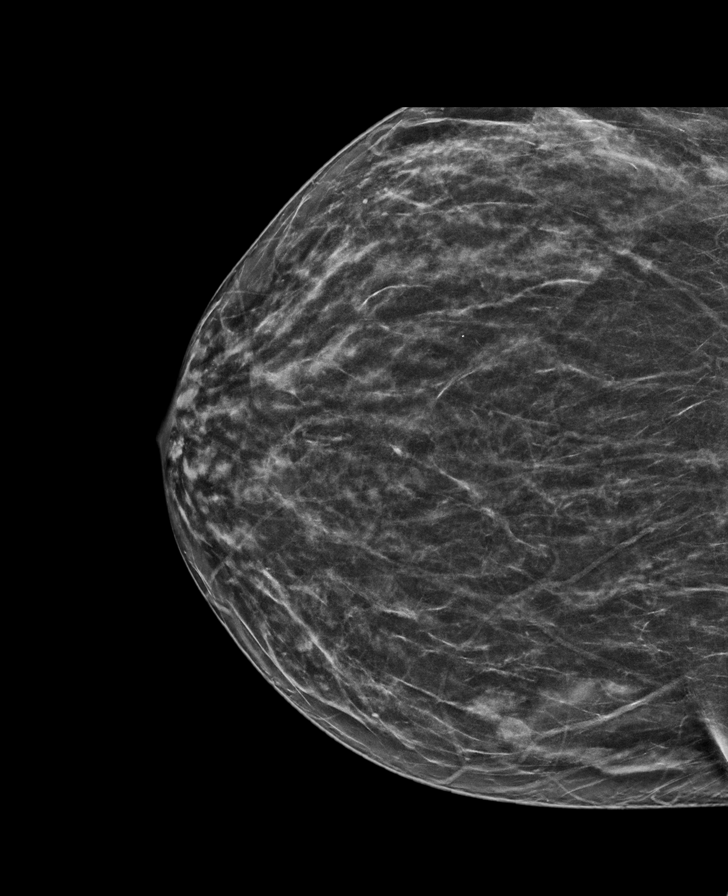

[L MLO synth-2D]
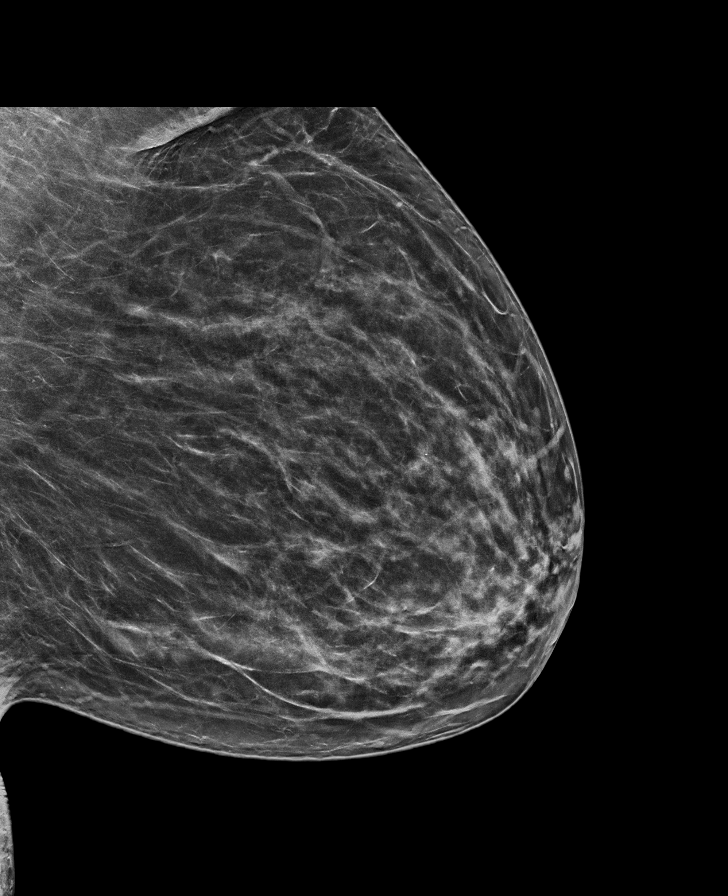

[R MLO synth-2D]
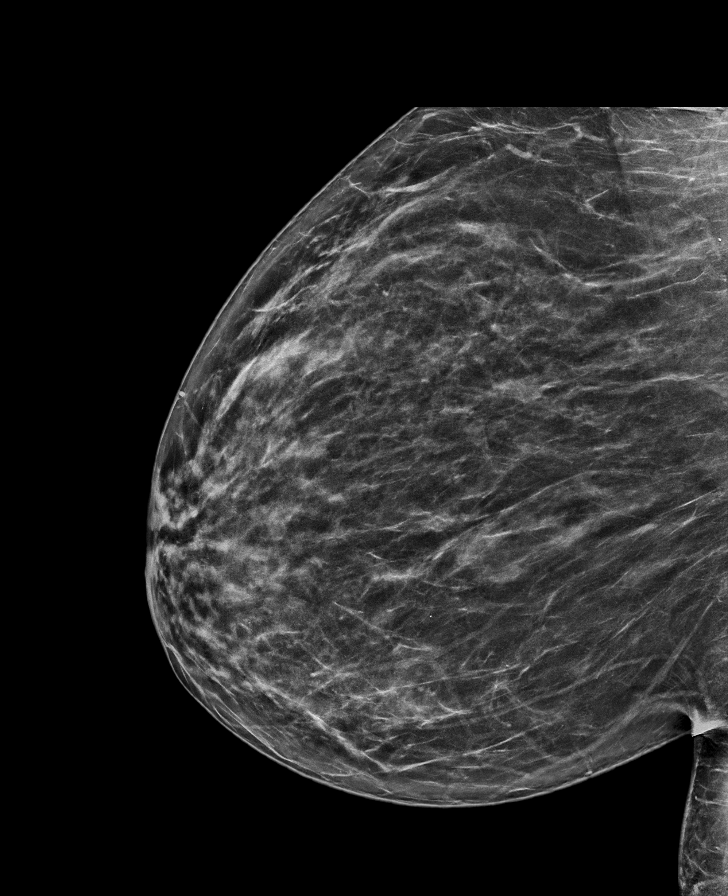

[R MLO tomo · tomo slice 39/77.0]
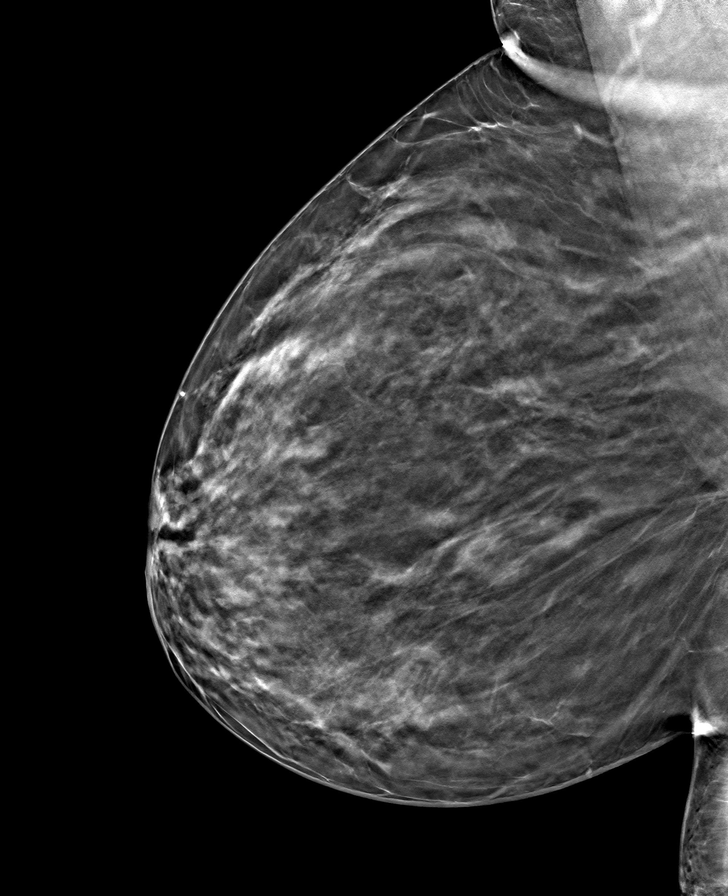

[R CC tomo · tomo slice 34/67.0]
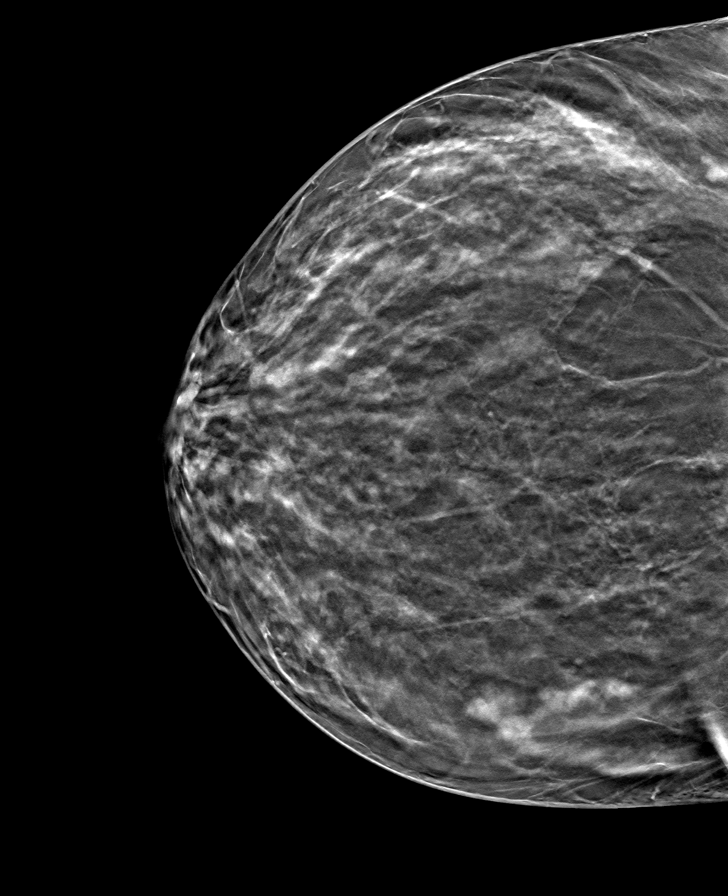

[L CC tomo · tomo slice 37/72.0]
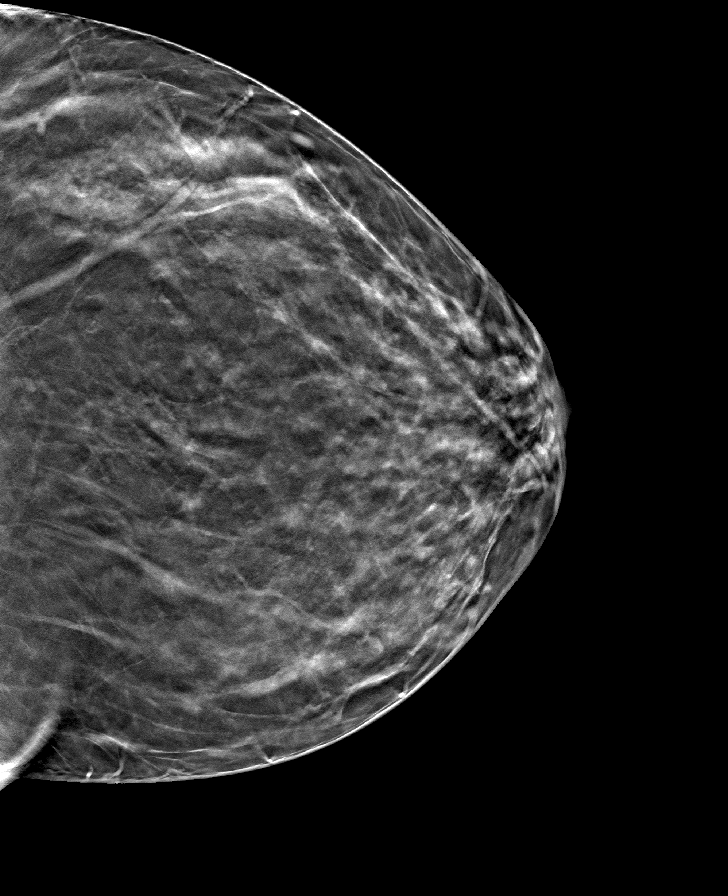

[L MLO tomo · tomo slice 39/78.0]
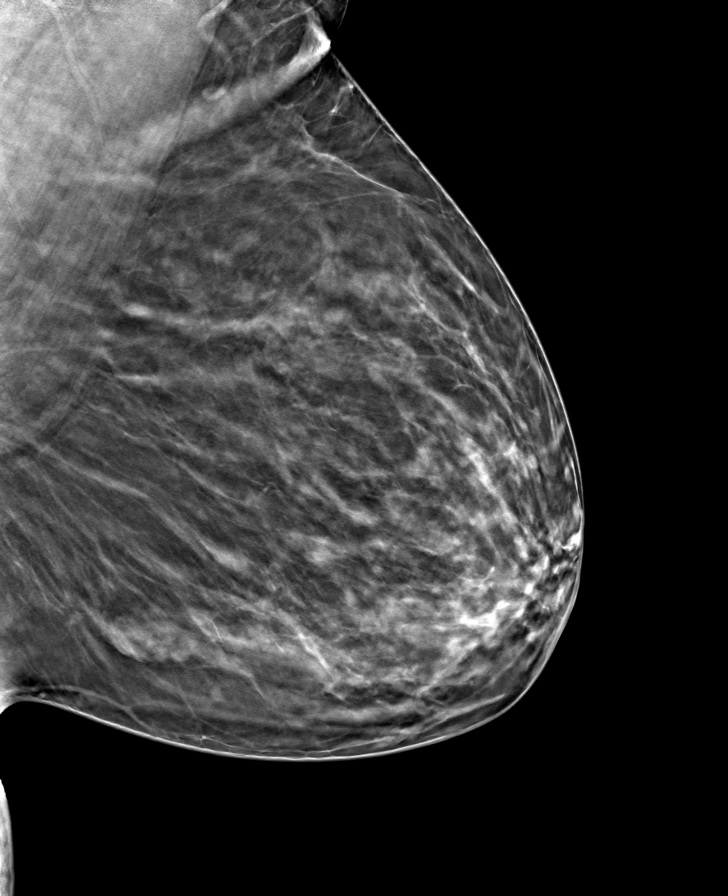

[8 of 24 positions shown; findings below may reference images not displayed]

ACR Breast Density Category b: There are scattered areas of
fibroglandular density.
FINDINGS: No mass, architectural distortion, or suspicious microcalcification
is identified to suggest malignancy in either breast. Stable
parenchymal pattern compared to prior mammogram.

Mammographic images were processed with CAD.
IMPRESSION: No evidence of malignancy in either breast.

RECOMMENDATION:
Screening mammogram in one year.(Code:JJ-[id number])

I have discussed the findings and recommendations with the patient.
Results were also provided in writing at the conclusion of the
visit. If applicable, a reminder letter will be sent to the patient
regarding the next appointment.

BI-RADS CATEGORY  1: Negative.

## 2019-11-01 ENCOUNTER — Encounter: Payer: Self-pay | Admitting: Podiatry

## 2019-11-02 ENCOUNTER — Telehealth: Payer: Self-pay

## 2019-11-02 NOTE — Telephone Encounter (Signed)
DOS 11/18/2019  LAPIDUS PROCEDURE INCLUDING BUNIONECTOMY RT - 37858  [employer] EFFECTIVE DATE - 11/14/2015  PLAN DEDUCTIBLE - $2800.00 W/ $0.00 REMAINING OUT OF POCKET - $5600.00 W/ $820.78 REMAINING COPAY $0.00 COINSURANCE - 80%  PER AUTOMATED SYSTEM NO PRECERT REQUIRED FOR CPT 28297. CALL REF # [id number]

## 2019-11-03 ENCOUNTER — Telehealth: Payer: Self-pay

## 2019-11-03 NOTE — Telephone Encounter (Signed)
Allison Valenzuela called to reschedule her surgery with Dr. Jacqualyn Posey from 11/18/2019 to 01/27/2020. She stated they are closing on their house in December and she would like to hold off till after the move. I notified Caren Griffins with [facility] of the reschedule.

## 2019-11-20 ENCOUNTER — Ambulatory Visit (INDEPENDENT_AMBULATORY_CARE_PROVIDER_SITE_OTHER): Payer: No Typology Code available for payment source | Admitting: *Deleted

## 2019-11-20 ENCOUNTER — Other Ambulatory Visit: Payer: Self-pay

## 2019-11-20 DIAGNOSIS — B351 Tinea unguium: Secondary | ICD-10-CM

## 2019-11-20 NOTE — Progress Notes (Signed)
Patient presents today for the 6th laser treatment. Diagnosed with mycotic nail infection by Dr. Jacqualyn Posey.   Toenail most affected hallux nail right. She is very happy with the result of her nail.  All other systems are negative.  Nails were filed thin. Laser therapy was administered to 1st toenails right and patient tolerated the treatment well. All safety precautions were in place.   Patient has completed the recommended laser treatments. He will follow up with Dr. Jacqualyn Posey in 3 months to evaluate progress.

## 2019-11-23 ENCOUNTER — Encounter: Payer: No Typology Code available for payment source | Admitting: Podiatry

## 2019-11-23 ENCOUNTER — Ambulatory Visit: Payer: No Typology Code available for payment source | Admitting: Allergy

## 2019-11-23 NOTE — Progress Notes (Deleted)
New Patient Note  RE: Allison Valenzuela MRN: [id number] DOB: 28-May-1980 Date of Office Visit: 11/23/2019  Referring provider: Bartholome Bill, MD Primary care provider: Bartholome Bill, MD  Chief Complaint: No chief complaint on file.  History of Present Illness: I had the pleasure of seeing Allison Valenzuela for initial evaluation at the [facility] [place] [facility] [place] on 11/23/2019. She is a 39 y.o. female, who is referred here by Bartholome Bill, MD for the evaluation of ***.  ***  Assessment and Plan: Allison Valenzuela is a 39 y.o. female with: No problem-specific Assessment & Plan notes found for this encounter.  No follow-ups on file.  No orders of the defined types were placed in this encounter.  Lab Orders  No laboratory test(s) ordered today    Other allergy screening: Asthma: {Blank single:19197::"yes","no"} Rhino conjunctivitis: {Blank single:19197::"yes","no"} Food allergy: {Blank single:19197::"yes","no"} Medication allergy: {Blank single:19197::"yes","no"} Hymenoptera allergy: {Blank single:19197::"yes","no"} Urticaria: {Blank single:19197::"yes","no"} Eczema:{Blank single:19197::"yes","no"} History of recurrent infections suggestive of immunodeficency: {Blank single:19197::"yes","no"}  Diagnostics: Spirometry:  Tracings reviewed. Her effort: {Blank single:19197::"Good reproducible efforts.","It was hard to get consistent efforts and there is a question as to whether this reflects a maximal maneuver.","Poor effort, data can not be interpreted."} FVC: ***L FEV1: ***L, ***% predicted FEV1/FVC ratio: ***% Interpretation: {Blank single:19197::"Spirometry consistent with mild obstructive disease","Spirometry consistent with moderate obstructive disease","Spirometry consistent with severe obstructive disease","Spirometry consistent with possible restrictive disease","Spirometry consistent with mixed obstructive and restrictive disease","Spirometry uninterpretable  due to technique","Spirometry consistent with normal pattern","No overt abnormalities noted given today's efforts"}.  Please see scanned spirometry results for details.  Skin Testing: {Blank single:19197::"Select foods","Environmental allergy panel","Environmental allergy panel and select foods","Food allergy panel","None","Deferred due to recent antihistamines use"}. Positive test to: ***. Negative test to: ***.  Results discussed with patient/family.   Past Medical History: Patient Active Problem List   Diagnosis Date Noted  . S/P laparoscopic assisted vaginal hysterectomy (LAVH) 05/06/2019  . Menorrhagia with regular cycle 10/27/2018  . Iron deficiency anemia due to chronic blood loss 10/27/2018  . History of DVT of lower extremity 10/27/2018  . B12 deficiency 10/27/2018  . Obesity (BMI 30-39.9) 10/27/2018  . Other insomnia 03/17/2018  . Vitamin D deficiency 02/19/2018  . Insulin resistance 02/19/2018  . Class 2 severe obesity with serious comorbidity and body mass index (BMI) of 36.0 to 36.9 in adult [facility]) 02/19/2018  . Ingrown toenail 09/12/2017  . Onychodystrophy 09/12/2017   Past Medical History:  Diagnosis Date  . Acid reflux   . Anemia   . Back pain   . Bilateral bunions   . Chest pain   . Constipation   . DVT (deep venous thrombosis) (HCC)    right leg  . Dyspnea   . Gestational diabetes   . HTN (hypertension)   . Lactose intolerance   . Leg edema   . Vitamin D deficiency    Past Surgical History: Past Surgical History:  Procedure Laterality Date  . LAPAROSCOPIC VAGINAL HYSTERECTOMY WITH SALPINGECTOMY Bilateral 05/06/2019   Procedure: LAPAROSCOPIC ASSISTED VAGINAL HYSTERECTOMY WITH SALPINGECTOMY;  Surgeon: [name], MD;  Location: [place];  Service: Gynecology;  Laterality: Bilateral;  SITE ALSO VAGINA  . ROUX-EN-Y PROCEDURE    . TONSILLECTOMY     Medication List:  Current Outpatient Medications  Medication Sig Dispense Refill  .  acetaminophen (TYLENOL) 500 MG tablet Take 2 tablets (1,000 mg total) by mouth every 8 (eight) hours as needed. 30 tablet 0  . cetirizine-pseudoephedrine (ZYRTEC-D) 5-120 MG tablet Take  1 tablet by mouth 2 (two) times daily.    . ferrous sulfate 325 (65 FE) MG tablet Take 325 mg by mouth every other day.    . ibuprofen (ADVIL) 600 MG tablet Take 1 tablet (600 mg total) by mouth every 6 (six) hours as needed. 30 tablet 1  . Multiple Vitamin (MULTIVITAMIN WITH MINERALS) TABS tablet Take 1 tablet by mouth daily.    . Omega-3 Fatty Acids (FISH OIL) 1000 MG CAPS Take 1,000 mg by mouth daily.    Marland Kitchen oxyCODONE (OXY IR/ROXICODONE) 5 MG immediate release tablet Take 1 tablet (5 mg total) by mouth every 4 (four) hours as needed for moderate pain. 15 tablet 0  . rivaroxaban (XARELTO) 20 MG TABS tablet Take 20 mg by mouth daily with supper.    . vitamin B-12 (CYANOCOBALAMIN) 1000 MCG tablet Take 2,000 mcg by mouth daily.    . Vitamin D, Ergocalciferol, (DRISDOL) 1.25 MG (50000 UNIT) CAPS capsule Take 1 capsule (50,000 Units total) by mouth every 7 (seven) days. 4 capsule 0   No current facility-administered medications for this visit.   Allergies: Allergies  Allergen Reactions  . Dye Fdc Blue 1 [Brilliant Blue Fcf] Itching and Rash  . Nickel Itching and Rash   Social History: Social History   Socioeconomic History  . Marital status: Married    Spouse name: Mearl Harewood  . Number of children: 3  . Years of education: Not on file  . Highest education level: Not on file  Occupational History  . Occupation: Cabin crew  Tobacco Use  . Smoking status: Never Smoker  . Smokeless tobacco: Never Used  Vaping Use  . Vaping Use: Never used  Substance and Sexual Activity  . Alcohol use: Not Currently  . Drug use: Never  . Sexual activity: Not on file  Other Topics Concern  . Not on file  Social History Narrative  . Not on file   Social Determinants of Health   Financial Resource Strain:    . Difficulty of Paying Living Expenses: Not on file  Food Insecurity:   . Worried About Charity fundraiser in the Last Year: Not on file  . Ran Out of Food in the Last Year: Not on file  Transportation Needs:   . Lack of Transportation (Medical): Not on file  . Lack of Transportation (Non-Medical): Not on file  Physical Activity:   . Days of Exercise per Week: Not on file  . Minutes of Exercise per Session: Not on file  Stress:   . Feeling of Stress : Not on file  Social Connections:   . Frequency of Communication with Friends and Family: Not on file  . Frequency of Social Gatherings with Friends and Family: Not on file  . Attends Religious Services: Not on file  . Active Member of Clubs or Organizations: Not on file  . Attends Archivist Meetings: Not on file  . Marital Status: Not on file   Lives in a ***. Smoking: *** Occupation: ***  Environmental HistoryFreight forwarder in the house: Estate agent in the family room: {Blank single:19197::"yes","no"} Carpet in the bedroom: {Blank single:19197::"yes","no"} Heating: {Blank single:19197::"electric","gas"} Cooling: {Blank single:19197::"central","window"} Pet: {Blank single:19197::"yes ***","no"}  Family History: Family History  Problem Relation Age of Onset  . Hypertension Mother   . Heart disease Mother   . Thyroid disease Mother   . Obesity Mother    Problem  Relation Asthma                                   *** Eczema                                *** Food allergy                          *** Allergic rhino conjunctivitis     ***  Review of Systems  Constitutional: Negative for appetite change, chills, fever and unexpected weight change.  HENT: Negative for congestion and rhinorrhea.   Eyes: Negative for itching.  Respiratory: Negative for cough, chest tightness, shortness of breath and wheezing.   Cardiovascular: Negative for chest pain.   Gastrointestinal: Negative for abdominal pain.  Genitourinary: Negative for difficulty urinating.  Skin: Negative for rash.  Neurological: Negative for headaches.   Objective: LMP 05/05/2019 (Exact Date)  There is no height or weight on file to calculate BMI. Physical Exam Vitals and nursing note reviewed.  Constitutional:      Appearance: Normal appearance. She is well-developed.  HENT:     Head: Normocephalic and atraumatic.     Right Ear: External ear normal.     Left Ear: External ear normal.     Nose: Nose normal.     Mouth/Throat:     Mouth: Mucous membranes are moist.     Pharynx: Oropharynx is clear.  Eyes:     Conjunctiva/sclera: Conjunctivae normal.  Cardiovascular:     Rate and Rhythm: Normal rate and regular rhythm.     Heart sounds: Normal heart sounds. No murmur heard.  No friction rub. No gallop.   Pulmonary:     Effort: Pulmonary effort is normal.     Breath sounds: Normal breath sounds. No wheezing, rhonchi or rales.  Abdominal:     Palpations: Abdomen is soft.  Musculoskeletal:     Cervical back: Neck supple.  Skin:    General: Skin is warm.     Findings: No rash.  Neurological:     Mental Status: She is alert and oriented to person, place, and time.  Psychiatric:        Behavior: Behavior normal.    The plan was reviewed with the patient/family, and all questions/concerned were addressed.  It was my pleasure to see Allison Valenzuela today and participate in her care. Please feel free to contact me with any questions or concerns.  Sincerely,  Rexene Alberts, DO Allergy & Immunology  [facility] office: [place] office: [phone]

## 2019-12-03 ENCOUNTER — Ambulatory Visit
Admission: RE | Admit: 2019-12-03 | Discharge: 2019-12-03 | Disposition: A | Discharge status: Home / Self Care | Payer: 59 | Source: Ambulatory Visit | Attending: Obstetrics and Gynecology | Admitting: Obstetrics and Gynecology

## 2019-12-03 ENCOUNTER — Encounter: Payer: No Typology Code available for payment source | Admitting: Podiatry

## 2019-12-03 ENCOUNTER — Other Ambulatory Visit: Payer: Self-pay

## 2019-12-03 DIAGNOSIS — N6489 Other specified disorders of breast: Secondary | ICD-10-CM

## 2019-12-17 ENCOUNTER — Encounter: Payer: No Typology Code available for payment source | Admitting: Podiatry

## 2020-01-13 ENCOUNTER — Ambulatory Visit: Payer: No Typology Code available for payment source | Admitting: Allergy

## 2020-01-13 NOTE — Progress Notes (Deleted)
New Patient Note  RE: Allison Valenzuela MRN: [id number] DOB: 06/01/1980 Date of Office Visit: 01/13/2020  Referring provider: Bartholome Bill, MD Primary care provider: Bartholome Bill, MD  Chief Complaint: No chief complaint on file.  History of Present Illness: I had the pleasure of seeing Allison Valenzuela for initial evaluation at the [facility] [place] [facility] [place] on 01/13/2020. She is a 39 y.o. female, who is referred here by Bartholome Bill, MD for the evaluation of ***.  ***  Assessment and Plan: Allison Valenzuela is a 39 y.o. female with: No problem-specific Assessment & Plan notes found for this encounter.  No follow-ups on file.  No orders of the defined types were placed in this encounter.  Lab Orders  No laboratory test(s) ordered today    Other allergy screening: Asthma: {Blank single:19197::"yes","no"} Rhino conjunctivitis: {Blank single:19197::"yes","no"} Food allergy: {Blank single:19197::"yes","no"} Medication allergy: {Blank single:19197::"yes","no"} Hymenoptera allergy: {Blank single:19197::"yes","no"} Urticaria: {Blank single:19197::"yes","no"} Eczema:{Blank single:19197::"yes","no"} History of recurrent infections suggestive of immunodeficency: {Blank single:19197::"yes","no"}  Diagnostics: Spirometry:  Tracings reviewed. Her effort: {Blank single:19197::"Good reproducible efforts.","It was hard to get consistent efforts and there is a question as to whether this reflects a maximal maneuver.","Poor effort, data can not be interpreted."} FVC: ***L FEV1: ***L, ***% predicted FEV1/FVC ratio: ***% Interpretation: {Blank single:19197::"Spirometry consistent with mild obstructive disease","Spirometry consistent with moderate obstructive disease","Spirometry consistent with severe obstructive disease","Spirometry consistent with possible restrictive disease","Spirometry consistent with mixed obstructive and restrictive disease","Spirometry uninterpretable  due to technique","Spirometry consistent with normal pattern","No overt abnormalities noted given today's efforts"}.  Please see scanned spirometry results for details.  Skin Testing: {Blank single:19197::"Select foods","Environmental allergy panel","Environmental allergy panel and select foods","Food allergy panel","None","Deferred due to recent antihistamines use"}. Positive test to: ***. Negative test to: ***.  Results discussed with patient/family.   Past Medical History: Patient Active Problem List   Diagnosis Date Noted  . S/P laparoscopic assisted vaginal hysterectomy (LAVH) 05/06/2019  . Menorrhagia with regular cycle 10/27/2018  . Iron deficiency anemia due to chronic blood loss 10/27/2018  . History of DVT of lower extremity 10/27/2018  . B12 deficiency 10/27/2018  . Obesity (BMI 30-39.9) 10/27/2018  . Other insomnia 03/17/2018  . Vitamin D deficiency 02/19/2018  . Insulin resistance 02/19/2018  . Class 2 severe obesity with serious comorbidity and body mass index (BMI) of 36.0 to 36.9 in adult Allison Valenzuela) 02/19/2018  . Ingrown toenail 09/12/2017  . Onychodystrophy 09/12/2017   Past Medical History:  Diagnosis Date  . Acid reflux   . Anemia   . Back pain   . Bilateral bunions   . Chest pain   . Constipation   . DVT (deep venous thrombosis) (HCC)    right leg  . Dyspnea   . Gestational diabetes   . HTN (hypertension)   . Lactose intolerance   . Leg edema   . Vitamin D deficiency    Past Surgical History: Past Surgical History:  Procedure Laterality Date  . LAPAROSCOPIC VAGINAL HYSTERECTOMY WITH SALPINGECTOMY Bilateral 05/06/2019   Procedure: LAPAROSCOPIC ASSISTED VAGINAL HYSTERECTOMY WITH SALPINGECTOMY;  Surgeon: [name], MD;  Location: [place];  Service: Gynecology;  Laterality: Bilateral;  SITE ALSO VAGINA  . ROUX-EN-Y PROCEDURE    . TONSILLECTOMY     Medication List:  Current Outpatient Medications  Medication Sig Dispense Refill  .  acetaminophen (TYLENOL) 500 MG tablet Take 2 tablets (1,000 mg total) by mouth every 8 (eight) hours as needed. 30 tablet 0  . cetirizine-pseudoephedrine (ZYRTEC-D) 5-120 MG tablet Take  1 tablet by mouth 2 (two) times daily.    . ferrous sulfate 325 (65 FE) MG tablet Take 325 mg by mouth every other day.    . ibuprofen (ADVIL) 600 MG tablet Take 1 tablet (600 mg total) by mouth every 6 (six) hours as needed. 30 tablet 1  . Multiple Vitamin (MULTIVITAMIN WITH MINERALS) TABS tablet Take 1 tablet by mouth daily.    . Omega-3 Fatty Acids (FISH OIL) 1000 MG CAPS Take 1,000 mg by mouth daily.    Marland Kitchen oxyCODONE (OXY IR/ROXICODONE) 5 MG immediate release tablet Take 1 tablet (5 mg total) by mouth every 4 (four) hours as needed for moderate pain. 15 tablet 0  . rivaroxaban (XARELTO) 20 MG TABS tablet Take 20 mg by mouth daily with supper.    . vitamin B-12 (CYANOCOBALAMIN) 1000 MCG tablet Take 2,000 mcg by mouth daily.    . Vitamin D, Ergocalciferol, (DRISDOL) 1.25 MG (50000 UNIT) CAPS capsule Take 1 capsule (50,000 Units total) by mouth every 7 (seven) days. 4 capsule 0   No current facility-administered medications for this visit.   Allergies: Allergies  Allergen Reactions  . Dye Fdc Blue 1 [Brilliant Blue Fcf] Itching and Rash  . Nickel Itching and Rash   Social History: Social History   Socioeconomic History  . Marital status: Married    Spouse name: Shailynn Berninger  . Number of children: 3  . Years of education: Not on file  . Highest education level: Not on file  Occupational History  . Occupation: Tree surgeon  Tobacco Use  . Smoking status: Never Smoker  . Smokeless tobacco: Never Used  Vaping Use  . Vaping Use: Never used  Substance and Sexual Activity  . Alcohol use: Not Currently  . Drug use: Never  . Sexual activity: Not on file  Other Topics Concern  . Not on file  Social History Narrative  . Not on file   Social Determinants of Health   Financial Resource Strain:  Not on file  Food Insecurity: Not on file  Transportation Needs: Not on file  Physical Activity: Not on file  Stress: Not on file  Social Connections: Not on file   Lives in a ***. Smoking: *** Occupation: ***  Environmental HistorySurveyor, minerals in the house: Copywriter, advertising in the family room: {Blank single:19197::"yes","no"} Carpet in the bedroom: {Blank single:19197::"yes","no"} Heating: {Blank single:19197::"electric","gas"} Cooling: {Blank single:19197::"central","window"} Pet: {Blank single:19197::"yes ***","no"}  Family History: Family History  Problem Relation Age of Onset  . Hypertension Mother   . Heart disease Mother   . Thyroid disease Mother   . Obesity Mother    Problem                               Relation Asthma                                   *** Eczema                                *** Food allergy                          *** Allergic rhino conjunctivitis     ***  Review of Systems  Constitutional: Negative for appetite change, chills,  fever and unexpected weight change.  HENT: Negative for congestion and rhinorrhea.   Eyes: Negative for itching.  Respiratory: Negative for cough, chest tightness, shortness of breath and wheezing.   Cardiovascular: Negative for chest pain.  Gastrointestinal: Negative for abdominal pain.  Genitourinary: Negative for difficulty urinating.  Skin: Negative for rash.  Neurological: Negative for headaches.   Objective: LMP 05/05/2019 (Exact Date)  There is no height or weight on file to calculate BMI. Physical Exam Vitals and nursing note reviewed.  Constitutional:      Appearance: Normal appearance. She is well-developed.  HENT:     Head: Normocephalic and atraumatic.     Right Ear: External ear normal.     Left Ear: External ear normal.     Nose: Nose normal.     Mouth/Throat:     Mouth: Mucous membranes are moist.     Pharynx: Oropharynx is clear.  Eyes:      Conjunctiva/sclera: Conjunctivae normal.  Cardiovascular:     Rate and Rhythm: Normal rate and regular rhythm.     Heart sounds: Normal heart sounds. No murmur heard. No friction rub. No gallop.   Pulmonary:     Effort: Pulmonary effort is normal.     Breath sounds: Normal breath sounds. No wheezing, rhonchi or rales.  Abdominal:     Palpations: Abdomen is soft.  Musculoskeletal:     Cervical back: Neck supple.  Skin:    General: Skin is warm.     Findings: No rash.  Neurological:     Mental Status: She is alert and oriented to person, place, and time.  Psychiatric:        Behavior: Behavior normal.    The plan was reviewed with the patient/family, and all questions/concerned were addressed.  It was my pleasure to see Timisha today and participate in her care. Please feel free to contact me with any questions or concerns.  Sincerely,  Wyline Mood, DO Allergy & Immunology  [facility] office: [phone] [facility] office: [phone]

## 2020-01-20 ENCOUNTER — Encounter: Payer: Self-pay | Admitting: Podiatry

## 2020-01-20 ENCOUNTER — Telehealth: Payer: Self-pay

## 2020-01-20 NOTE — Telephone Encounter (Addendum)
DOS 05/11/2020  LAPIDUS PROC INCLUDING BUNIONECTOMY RT - 59458  [employer] EFFECTIVE DATE - 11/14/2015  PLAN DEDUCTIBLE - $2800.00 W/ $0.00 REMAINING OUT OF POCKET - $5600.00 W/ $0.00 REMAINING COPAY $0.00 COINSURANCE - 80%  PER AUTOMATED SYSTEM, NO PRECERT IS REQUIRED FOR CPT 28297. CALL REF # [id number]

## 2020-02-01 ENCOUNTER — Encounter: Payer: No Typology Code available for payment source | Admitting: Podiatry

## 2020-02-11 ENCOUNTER — Encounter: Payer: No Typology Code available for payment source | Admitting: Podiatry

## 2020-02-25 ENCOUNTER — Encounter: Payer: No Typology Code available for payment source | Admitting: Podiatry

## 2020-05-11 ENCOUNTER — Telehealth: Payer: Self-pay | Admitting: Urology

## 2020-05-11 NOTE — Telephone Encounter (Signed)
PT WAS A NO SHOW FOR HER SURGERY ON 05/11/20. SPOKE WITH DOCTOR AND HE STATED THAT PT IS NOT TO BE RESCHEDULED FOR SURGERY UNTIL SHE COMES BACK IN THE OFFICE AND SEE HIM.

## 2020-05-16 ENCOUNTER — Encounter: Payer: No Typology Code available for payment source | Admitting: Podiatry

## 2020-05-26 ENCOUNTER — Encounter: Payer: No Typology Code available for payment source | Admitting: Podiatry

## 2020-05-26 DIAGNOSIS — E038 Other specified hypothyroidism: Secondary | ICD-10-CM | POA: Insufficient documentation

## 2020-05-26 DIAGNOSIS — E7800 Pure hypercholesterolemia, unspecified: Secondary | ICD-10-CM | POA: Insufficient documentation

## 2020-06-09 ENCOUNTER — Encounter: Payer: No Typology Code available for payment source | Admitting: Podiatry

## 2020-08-11 DIAGNOSIS — G43909 Migraine, unspecified, not intractable, without status migrainosus: Secondary | ICD-10-CM | POA: Insufficient documentation

## 2020-08-11 DIAGNOSIS — G44219 Episodic tension-type headache, not intractable: Secondary | ICD-10-CM | POA: Insufficient documentation

## 2020-10-20 ENCOUNTER — Other Ambulatory Visit: Payer: Self-pay | Admitting: Family Medicine

## 2020-10-20 DIAGNOSIS — N6489 Other specified disorders of breast: Secondary | ICD-10-CM

## 2020-12-05 ENCOUNTER — Other Ambulatory Visit: Payer: Self-pay

## 2020-12-05 ENCOUNTER — Ambulatory Visit
Admission: RE | Admit: 2020-12-05 | Discharge: 2020-12-05 | Disposition: A | Discharge status: Home / Self Care | Payer: No Typology Code available for payment source | Source: Ambulatory Visit | Attending: Family Medicine | Admitting: Family Medicine

## 2020-12-05 DIAGNOSIS — N6489 Other specified disorders of breast: Secondary | ICD-10-CM

## 2021-07-27 ENCOUNTER — Encounter: Payer: Self-pay | Admitting: Internal Medicine

## 2021-07-27 ENCOUNTER — Ambulatory Visit (INDEPENDENT_AMBULATORY_CARE_PROVIDER_SITE_OTHER): Payer: No Typology Code available for payment source | Admitting: Internal Medicine

## 2021-07-27 VITALS — BP 136/86 | HR 73 | Resp 18 | Ht 66.0 in | Wt 215.4 lb

## 2021-07-27 DIAGNOSIS — G8929 Other chronic pain: Secondary | ICD-10-CM | POA: Insufficient documentation

## 2021-07-27 DIAGNOSIS — E038 Other specified hypothyroidism: Secondary | ICD-10-CM

## 2021-07-27 DIAGNOSIS — E8881 Metabolic syndrome: Secondary | ICD-10-CM

## 2021-07-27 DIAGNOSIS — R232 Flushing: Secondary | ICD-10-CM | POA: Insufficient documentation

## 2021-07-27 DIAGNOSIS — Z86718 Personal history of other venous thrombosis and embolism: Secondary | ICD-10-CM

## 2021-07-27 DIAGNOSIS — Z9884 Bariatric surgery status: Secondary | ICD-10-CM

## 2021-07-27 DIAGNOSIS — E782 Mixed hyperlipidemia: Secondary | ICD-10-CM

## 2021-07-27 DIAGNOSIS — D500 Iron deficiency anemia secondary to blood loss (chronic): Secondary | ICD-10-CM

## 2021-07-27 DIAGNOSIS — Z7901 Long term (current) use of anticoagulants: Secondary | ICD-10-CM

## 2021-07-27 DIAGNOSIS — Z1159 Encounter for screening for other viral diseases: Secondary | ICD-10-CM

## 2021-07-27 DIAGNOSIS — M25562 Pain in left knee: Secondary | ICD-10-CM

## 2021-07-27 DIAGNOSIS — E559 Vitamin D deficiency, unspecified: Secondary | ICD-10-CM

## 2021-07-27 DIAGNOSIS — Z114 Encounter for screening for human immunodeficiency virus [HIV]: Secondary | ICD-10-CM

## 2021-07-27 NOTE — Assessment & Plan Note (Signed)
Check TSH and free T4 

## 2021-07-27 NOTE — Assessment & Plan Note (Signed)
Followed by Rosanne Gutting

## 2021-07-27 NOTE — Progress Notes (Signed)
New Patient Office Visit  Subjective:  Patient ID: Allison Valenzuela, female    DOB: 08/21/1980  Age: 41 y.o. MRN: [id number]  CC:  Chief Complaint  Patient presents with   New Patient (Initial Visit)    New patient just establishing care     HPI Allison Valenzuela is a 41 y.o. female with past medical history of unprovoked DVT and obesity s/p gastric bypass surgery who presents for establishing care.  She has h/o unprovoked DVT of RLE in 2019 and has been on Xarelto since then.  She denies any recent leg swelling or pain.  She is followed by heme-onc at [facility].  She had also iron deficiency anemia, for which she received iron transfusions, which has improved her anemia.  She had history of menorrhagia, which has resolved after hysterectomy.  Chart review suggests history of subclinical hypothyroidism.  She denies any recent change in weight or appetite, tremors, palpitations, constipation or diarrhea.  Past Medical History:  Diagnosis Date   Acid reflux    Anemia    Back pain    Bilateral bunions    Chest pain    Constipation    DVT (deep venous thrombosis) (HCC)    right leg   Dyspnea    Gestational diabetes    HTN (hypertension)    Lactose intolerance    Leg edema    Vitamin D deficiency     Past Surgical History:  Procedure Laterality Date   LAPAROSCOPIC VAGINAL HYSTERECTOMY WITH SALPINGECTOMY Bilateral 05/06/2019   Procedure: LAPAROSCOPIC ASSISTED VAGINAL HYSTERECTOMY WITH SALPINGECTOMY;  Surgeon: [name], MD;  Location: [facility];  Service: Gynecology;  Laterality: Bilateral;  SITE ALSO VAGINA   ROUX-EN-Y PROCEDURE     TONSILLECTOMY      Family History  Problem Relation Age of Onset   Hypertension Mother    Heart disease Mother    Thyroid disease Mother    Obesity Mother     Social History   Socioeconomic History   Marital status: Married    Spouse name: Yury Schaus   Number of children: 3   Years of education: Not  on file   Highest education level: Not on file  Occupational History   Occupation: Cabin crew  Tobacco Use   Smoking status: Never   Smokeless tobacco: Never  Vaping Use   Vaping Use: Never used  Substance and Sexual Activity   Alcohol use: Not Currently   Drug use: Never   Sexual activity: Not on file  Other Topics Concern   Not on file  Social History Narrative   Not on file   Social Determinants of Health   Financial Resource Strain: Not on file  Food Insecurity: Not on file  Transportation Needs: Not on file  Physical Activity: Not on file  Stress: Not on file  Social Connections: Not on file  Intimate Partner Violence: Not on file    ROS Review of Systems  Constitutional:  Negative for chills and fever.  HENT:  Negative for congestion, sinus pressure, sinus pain and sore throat.   Eyes:  Negative for pain and discharge.  Respiratory:  Negative for cough and shortness of breath.   Cardiovascular:  Negative for chest pain and palpitations.  Gastrointestinal:  Negative for abdominal pain, constipation, diarrhea, nausea and vomiting.  Endocrine: Negative for polydipsia and polyuria.  Genitourinary:  Negative for dysuria and hematuria.  Musculoskeletal:  Negative for neck pain and neck stiffness.  Skin:  Negative for  rash.  Neurological:  Negative for dizziness and weakness.  Psychiatric/Behavioral:  Negative for agitation and behavioral problems.     Objective:   Today's Vitals: BP 136/86 (BP Location: Right Arm, Patient Position: Sitting, Cuff Size: Normal)   Pulse 73   Resp 18   Ht _0  (1.676 m)   Wt 215 lb 6.4 oz (97.7 kg)   LMP 04/08/2019   SpO2 100%   BMI 34.77 kg/m   Physical Exam Vitals reviewed.  Constitutional:      General: She is not in acute distress.    Appearance: She is obese. She is not diaphoretic.  HENT:     Head: Normocephalic and atraumatic.     Nose: Nose normal.     Mouth/Throat:     Mouth: Mucous membranes are moist.   Eyes:     General: No scleral icterus.    Extraocular Movements: Extraocular movements intact.  Cardiovascular:     Rate and Rhythm: Normal rate and regular rhythm.     Pulses: Normal pulses.     Heart sounds: No murmur heard. Pulmonary:     Breath sounds: Normal breath sounds. No wheezing or rales.  Musculoskeletal:     Cervical back: Neck supple. No tenderness.     Right lower leg: No edema.     Left lower leg: No edema.  Skin:    General: Skin is warm.     Findings: No rash.  Neurological:     General: No focal deficit present.     Mental Status: She is alert and oriented to person, place, and time.  Psychiatric:        Mood and Affect: Mood normal.        Behavior: Behavior normal.     Assessment & Plan:   Problem List Items Addressed This Visit       Endocrine   Insulin resistance    Check HbA1c      Relevant Orders   CMP14+EGFR   Hemoglobin A1c   Subclinical hypothyroidism    Check TSH and free T4      Relevant Orders   TSH + free T4     Other   Vitamin D deficiency    Check vitamin D level Advised to take vitamin D 1000 IU daily      Iron deficiency anemia due to chronic blood loss    Due to menorrhagia in the past Has had iron transfusions Check CBC      History of DVT of lower extremity    Unprovoked DVT of RLE in 2019 On Xarelto Followed by Heme/Onc. at [facility]      Current use of long term anticoagulation - Primary    For h/o unprovoked DVT Check CBC      History of gastric bypass    Needs to take bariatric multivitamin      Chronic pain of left knee    Followed by Rosanne Gutting      Other Visit Diagnoses     Mixed hyperlipidemia       Relevant Orders   Lipid Profile   Need for hepatitis C screening test       Relevant Orders   Hepatitis C Antibody   Encounter for screening for HIV       Relevant Orders   HIV antibody (with reflex)       Outpatient Encounter Medications as of 07/27/2021  Medication Sig    rivaroxaban (XARELTO) 20 MG TABS tablet Take 20 mg by  mouth daily with supper.   Vitamin D, Ergocalciferol, (DRISDOL) 1.25 MG (50000 UNIT) CAPS capsule Take 1 capsule (50,000 Units total) by mouth every 7 (seven) days.   [DISCONTINUED] acetaminophen (TYLENOL) 500 MG tablet Take 2 tablets (1,000 mg total) by mouth every 8 (eight) hours as needed. (Patient not taking: Reported on 07/27/2021)   [DISCONTINUED] cetirizine-pseudoephedrine (ZYRTEC-D) 5-120 MG tablet Take 1 tablet by mouth 2 (two) times daily. (Patient not taking: Reported on 07/27/2021)   [DISCONTINUED] ferrous sulfate 325 (65 FE) MG tablet Take 325 mg by mouth every other day. (Patient not taking: Reported on 07/27/2021)   [DISCONTINUED] ibuprofen (ADVIL) 600 MG tablet Take 1 tablet (600 mg total) by mouth every 6 (six) hours as needed. (Patient not taking: Reported on 07/27/2021)   [DISCONTINUED] Multiple Vitamin (MULTIVITAMIN WITH MINERALS) TABS tablet Take 1 tablet by mouth daily. (Patient not taking: Reported on 07/27/2021)   [DISCONTINUED] Omega-3 Fatty Acids (FISH OIL) 1000 MG CAPS Take 1,000 mg by mouth daily. (Patient not taking: Reported on 07/27/2021)   [DISCONTINUED] oxyCODONE (OXY IR/ROXICODONE) 5 MG immediate release tablet Take 1 tablet (5 mg total) by mouth every 4 (four) hours as needed for moderate pain. (Patient not taking: Reported on 07/27/2021)   [DISCONTINUED] vitamin B-12 (CYANOCOBALAMIN) 1000 MCG tablet Take 2,000 mcg by mouth daily. (Patient not taking: Reported on 07/27/2021)   No facility-administered encounter medications on file as of 07/27/2021.    Follow-up: Return in about 6 months (around 01/27/2022) for Annual physical.   Lindell Spar, MD

## 2021-07-27 NOTE — Assessment & Plan Note (Signed)
Needs to take bariatric multivitamin 

## 2021-07-27 NOTE — Assessment & Plan Note (Signed)
-  Check HbA1c. 

## 2021-07-27 NOTE — Patient Instructions (Addendum)
Please continue taking medications as prescribed.  Please continue to follow low carb diet and perform moderate exercise/walking at least 150 mins/week.  Please continue taking Bariatric vitamins.

## 2021-07-27 NOTE — Assessment & Plan Note (Signed)
Due to menorrhagia in the past Has had iron transfusions Check CBC

## 2021-07-27 NOTE — Assessment & Plan Note (Signed)
Check vitamin D level Advised to take vitamin D 1000 IU daily

## 2021-07-27 NOTE — Assessment & Plan Note (Signed)
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 at [facility]

## 2021-07-27 NOTE — Assessment & Plan Note (Signed)
For h/o unprovoked DVT Check CBC

## 2021-07-28 LAB — CMP14+EGFR
ALT: 10 IU/L (ref 0–32)
AST: 21 IU/L (ref 0–40)
Albumin/Globulin Ratio: 1.6 (ref 1.2–2.2)
Albumin: 4.4 g/dL (ref 3.9–4.9)
Alkaline Phosphatase: 55 IU/L (ref 44–121)
BUN/Creatinine Ratio: 16 (ref 9–23)
BUN: 14 mg/dL (ref 6–24)
Bilirubin Total: 1.4 mg/dL — ABNORMAL HIGH (ref 0.0–1.2)
CO2: 22 mmol/L (ref 20–29)
Calcium: 9.3 mg/dL (ref 8.7–10.2)
Chloride: 102 mmol/L (ref 96–106)
Creatinine, Ser: 0.86 mg/dL (ref 0.57–1.00)
Globulin, Total: 2.8 g/dL (ref 1.5–4.5)
Glucose: 87 mg/dL (ref 70–99)
Potassium: 4.2 mmol/L (ref 3.5–5.2)
Sodium: 138 mmol/L (ref 134–144)
Total Protein: 7.2 g/dL (ref 6.0–8.5)
eGFR: 86 mL/min/{1.73_m2} (ref >59)

## 2021-07-28 LAB — LIPID PANEL
Chol/HDL Ratio: 3 ratio (ref 0.0–4.4)
Cholesterol, Total: 193 mg/dL (ref 100–199)
HDL: 65 mg/dL (ref >39)
LDL Chol Calc (NIH): 117 mg/dL — ABNORMAL HIGH (ref 0–99)
Triglycerides: 59 mg/dL (ref 0–149)
VLDL Cholesterol Cal: 11 mg/dL (ref 5–40)

## 2021-07-28 LAB — HEPATITIS C ANTIBODY: Hep C Virus Ab: NONREACTIVE

## 2021-07-28 LAB — HEMOGLOBIN A1C
Est. average glucose Bld gHb Est-mCnc: 91 mg/dL
Hgb A1c MFr Bld: 4.8 % (ref 4.8–5.6)

## 2021-07-28 LAB — TSH+FREE T4
Free T4: 1.12 ng/dL (ref 0.82–1.77)
TSH: 0.791 u[IU]/mL (ref 0.450–4.500)

## 2021-07-28 LAB — HIV ANTIBODY (ROUTINE TESTING W REFLEX): HIV Screen 4th Generation wRfx: NONREACTIVE

## 2021-07-31 ENCOUNTER — Ambulatory Visit (INDEPENDENT_AMBULATORY_CARE_PROVIDER_SITE_OTHER): Payer: No Typology Code available for payment source | Admitting: *Deleted

## 2021-07-31 DIAGNOSIS — Z111 Encounter for screening for respiratory tuberculosis: Secondary | ICD-10-CM

## 2021-08-02 LAB — TB SKIN TEST
Induration: 0 mm
TB Skin Test: NEGATIVE

## 2021-08-23 ENCOUNTER — Encounter (INDEPENDENT_AMBULATORY_CARE_PROVIDER_SITE_OTHER): Payer: Self-pay

## 2021-11-28 ENCOUNTER — Other Ambulatory Visit: Payer: Self-pay | Admitting: Internal Medicine

## 2021-11-28 DIAGNOSIS — Z1231 Encounter for screening mammogram for malignant neoplasm of breast: Secondary | ICD-10-CM

## 2021-12-12 ENCOUNTER — Ambulatory Visit
Admission: RE | Admit: 2021-12-12 | Discharge: 2021-12-12 | Disposition: A | Discharge status: Home / Self Care | Payer: No Typology Code available for payment source | Source: Ambulatory Visit | Attending: Internal Medicine | Admitting: Internal Medicine

## 2021-12-12 DIAGNOSIS — Z1231 Encounter for screening mammogram for malignant neoplasm of breast: Secondary | ICD-10-CM

## 2022-02-02 ENCOUNTER — Encounter: Payer: Self-pay | Admitting: Internal Medicine

## 2022-02-02 ENCOUNTER — Ambulatory Visit (INDEPENDENT_AMBULATORY_CARE_PROVIDER_SITE_OTHER): Payer: No Typology Code available for payment source | Admitting: Internal Medicine

## 2022-02-02 VITALS — BP 129/87 | HR 76 | Ht 66.0 in | Wt 221.8 lb

## 2022-02-02 DIAGNOSIS — Z7901 Long term (current) use of anticoagulants: Secondary | ICD-10-CM

## 2022-02-02 DIAGNOSIS — E669 Obesity, unspecified: Secondary | ICD-10-CM

## 2022-02-02 DIAGNOSIS — G43019 Migraine without aura, intractable, without status migrainosus: Secondary | ICD-10-CM

## 2022-02-02 DIAGNOSIS — Z0001 Encounter for general adult medical examination with abnormal findings: Secondary | ICD-10-CM

## 2022-02-02 DIAGNOSIS — Z90710 Acquired absence of both cervix and uterus: Secondary | ICD-10-CM | POA: Diagnosis not present

## 2022-02-02 DIAGNOSIS — Z9884 Bariatric surgery status: Secondary | ICD-10-CM

## 2022-02-02 DIAGNOSIS — E038 Other specified hypothyroidism: Secondary | ICD-10-CM

## 2022-02-02 DIAGNOSIS — E559 Vitamin D deficiency, unspecified: Secondary | ICD-10-CM

## 2022-02-02 DIAGNOSIS — Z23 Encounter for immunization: Secondary | ICD-10-CM

## 2022-02-02 HISTORY — DX: Encounter for general adult medical examination with abnormal findings: Z00.01

## 2022-02-02 MED ORDER — SUMATRIPTAN SUCCINATE 50 MG PO TABS: 50.0000 mg | ORAL_TABLET | ORAL | 0 refills | Status: DC | PRN

## 2022-02-02 NOTE — Progress Notes (Signed)
Established Patient Office Visit  Subjective:  Patient ID: Allison Valenzuela, female    DOB: December 17, 1980  Age: 42 y.o. MRN: [id number]  CC:  Chief Complaint  Patient presents with   Annual Exam   Migraine    HPI Allison Valenzuela is a 42 y.o. female with past medical history of unprovoked DVT and obesity s/p gastric bypass surgery who presents for annual physical.  Migraine: She complains of right-sided headache episodes, once in a month.  Headache is sharp, in the forehead area, nonradiating and is associated with nausea.  It is provoked by stress and lack of sleep.  She is currently doing to jobs and goes to school. Denies any photo or phonophobia.  She has had ENT evaluation for possible sinusitis, but was told that she does not have any sinus issue.  She was prescribed Topamax, but she did not take it as she has occasional headaches only.  She has also had optometric evaluation, which was unremarkable.  She has h/o unprovoked DVT of RLE in 2019 and has been on Xarelto since then.  She denies any recent leg swelling or pain.  She is followed by heme-onc at [facility].  She had also iron deficiency anemia, for which she received iron transfusions, which has improved her anemia. She had history of menorrhagia, which has resolved after hysterectomy.      Past Medical History:  Diagnosis Date   Acid reflux    Anemia    Anxiety 09/15/21   Back pain    Bilateral bunions    Chest pain    Constipation    DVT (deep venous thrombosis) (HCC)    right leg   Dyspnea    Encounter for general adult medical examination with abnormal findings 02/02/2022   Gestational diabetes    HTN (hypertension)    Lactose intolerance    Leg edema    Vitamin D deficiency     Past Surgical History:  Procedure Laterality Date   LAPAROSCOPIC VAGINAL HYSTERECTOMY WITH SALPINGECTOMY Bilateral 05/06/2019   Procedure: LAPAROSCOPIC ASSISTED VAGINAL HYSTERECTOMY WITH SALPINGECTOMY;  Surgeon: [name], MD;  Location: [facility];  Service: Gynecology;  Laterality: Bilateral;  SITE ALSO VAGINA   ROUX-EN-Y PROCEDURE     TONSILLECTOMY     TOTAL ABDOMINAL HYSTERECTOMY     04/22    Family History  Problem Relation Age of Onset   Hypertension Mother    Heart disease Mother    Thyroid disease Mother    Obesity Mother     Social History   Socioeconomic History   Marital status: Married    Spouse name: Quandra Fedorchak   Number of children: 3   Years of education: Not on file   Highest education level: Not on file  Occupational History   Occupation: Cabin crew  Tobacco Use   Smoking status: Never   Smokeless tobacco: Never  Vaping Use   Vaping Use: Never used  Substance and Sexual Activity   Alcohol use: Not Currently    Comment: Social drinker   Drug use: Never   Sexual activity: Yes    Birth control/protection: None  Other Topics Concern   Not on file  Social History Narrative   Not on file   Social Determinants of Health   Financial Resource Strain: Not on file  Food Insecurity: Not on file  Transportation Needs: Not on file  Physical Activity: Not on file  Stress: Not on file  Social Connections: Not on  file  Intimate Partner Violence: Not on file    Outpatient Medications Prior to Visit  Medication Sig Dispense Refill   rivaroxaban (XARELTO) 20 MG TABS tablet Take 20 mg by mouth daily with supper.     Vitamin D, Ergocalciferol, (DRISDOL) 1.25 MG (50000 UNIT) CAPS capsule Take 1 capsule (50,000 Units total) by mouth every 7 (seven) days. 4 capsule 0   No facility-administered medications prior to visit.    Allergies  Allergen Reactions   Dye Fdc Blue 1 [Fd&C Blue #1 (Brilliant Blue)] Itching and Rash   Nickel Itching and Rash    ROS Review of Systems  Constitutional:  Negative for chills and fever.  HENT:  Negative for congestion, sinus pressure, sinus pain and sore throat.   Eyes:  Negative for pain and discharge.   Respiratory:  Negative for cough and shortness of breath.   Cardiovascular:  Negative for chest pain and palpitations.  Gastrointestinal:  Negative for abdominal pain, constipation, diarrhea, nausea and vomiting.  Endocrine: Negative for polydipsia and polyuria.  Genitourinary:  Negative for dysuria and hematuria.  Musculoskeletal:  Negative for neck pain and neck stiffness.  Skin:  Negative for rash.  Neurological:  Positive for dizziness and headaches. Negative for weakness.  Psychiatric/Behavioral:  Positive for sleep disturbance. Negative for agitation and behavioral problems.       Objective:    Physical Exam Vitals reviewed.  Constitutional:      General: She is not in acute distress.    Appearance: She is obese. She is not diaphoretic.  HENT:     Head: Normocephalic and atraumatic.     Nose: Nose normal.     Mouth/Throat:     Mouth: Mucous membranes are moist.  Eyes:     General: No scleral icterus.    Extraocular Movements: Extraocular movements intact.  Cardiovascular:     Rate and Rhythm: Normal rate and regular rhythm.     Pulses: Normal pulses.     Heart sounds: No murmur heard. Pulmonary:     Breath sounds: Normal breath sounds. No wheezing or rales.  Abdominal:     Palpations: Abdomen is soft.     Tenderness: There is no abdominal tenderness.  Musculoskeletal:     Cervical back: Neck supple. No tenderness.     Right lower leg: No edema.     Left lower leg: No edema.  Skin:    General: Skin is warm.     Findings: No rash.  Neurological:     General: No focal deficit present.     Mental Status: She is alert and oriented to person, place, and time.     Cranial Nerves: No cranial nerve deficit.     Sensory: No sensory deficit.     Motor: No weakness.  Psychiatric:        Mood and Affect: Mood normal.        Behavior: Behavior normal.     BP 129/87   Pulse 76   Ht '5\' 6"'$  (1.676 m)   Wt 221 lb 12.8 oz (100.6 kg)   LMP 04/08/2019   SpO2 98%   BMI  35.80 kg/m  Wt Readings from Last 3 Encounters:  02/02/22 221 lb 12.8 oz (100.6 kg)  07/27/21 215 lb 6.4 oz (97.7 kg)  05/06/19 245 lb 3.2 oz (111.2 kg)    Lab Results  Component Value Date   TSH 0.791 07/27/2021   Lab Results  Component Value Date   WBC 3.0 (L) 05/17/2019  HGB 11.9 (L) 05/17/2019   HCT 37.5 05/17/2019   MCV 97.4 05/17/2019   PLT 237 05/17/2019   Lab Results  Component Value Date   NA 138 07/27/2021   K 4.2 07/27/2021   CO2 22 07/27/2021   GLUCOSE 87 07/27/2021   BUN 14 07/27/2021   CREATININE 0.86 07/27/2021   BILITOT 1.4 (H) 07/27/2021   ALKPHOS 55 07/27/2021   AST 21 07/27/2021   ALT 10 07/27/2021   PROT 7.2 07/27/2021   ALBUMIN 4.4 07/27/2021   CALCIUM 9.3 07/27/2021   ANIONGAP 7 05/17/2019   EGFR 86 07/27/2021   Lab Results  Component Value Date   CHOL 193 07/27/2021   Lab Results  Component Value Date   HDL 65 07/27/2021   Lab Results  Component Value Date   LDLCALC 117 (H) 07/27/2021   Lab Results  Component Value Date   TRIG 59 07/27/2021   Lab Results  Component Value Date   CHOLHDL 3.0 07/27/2021   Lab Results  Component Value Date   HGBA1C 4.8 07/27/2021      Assessment & Plan:   Problem List Items Addressed This Visit       Cardiovascular and Mediastinum   Migraine    Her unilateral headache episodes are likely migraine Imitrex as needed for migraine If frequent episodes, will need maintenance treatment      Relevant Medications   SUMAtriptan (IMITREX) 50 MG tablet   Other Relevant Orders   CMP14+EGFR     Endocrine   Subclinical hypothyroidism    Check TSH and free T4      Relevant Orders   TSH + free T4     Other   Vitamin D deficiency   Relevant Orders   Vitamin D (25 hydroxy)   Obesity (BMI 30-39.9)    S/p gastric bypass surgery Diet modification and moderate exercise advised       S/P laparoscopic assisted vaginal hysterectomy (LAVH)   Current use of long term anticoagulation    For  h/o unprovoked DVT Checked CBC from care everywhere      History of gastric bypass    Needs to take bariatric multivitamin      Relevant Orders   B12   Encounter for general adult medical examination with abnormal findings - Primary    Physical exam as documented. Fasting blood tests ordered. Tdap vaccine today.      Other Visit Diagnoses     Need for Tdap vaccination       Relevant Orders   Tdap vaccine greater than or equal to 7yo IM (Completed)       Meds ordered this encounter  Medications   SUMAtriptan (IMITREX) 50 MG tablet    Sig: Take 1 tablet (50 mg total) by mouth every 2 (two) hours as needed for migraine. May repeat in 2 hours if headache persists or recurs.    Dispense:  10 tablet    Refill:  0    Follow-up: Return in about 6 months (around 08/03/2022).    Lindell Spar, MD

## 2022-02-02 NOTE — Assessment & Plan Note (Signed)
Her unilateral headache episodes are likely migraine Imitrex as needed for migraine If frequent episodes, will need maintenance treatment

## 2022-02-02 NOTE — Assessment & Plan Note (Signed)
Needs to take bariatric multivitamin

## 2022-02-02 NOTE — Patient Instructions (Signed)
Please take Sumatriptan as needed for migraine. Okay to repeat dose after 2 hours, maximum 2 tablets in a day.  Please continue to take other medications as prescribed.  Please continue to follow low cholesterol diet and perform moderate exercise/walking at least 150 mins/week.

## 2022-02-02 NOTE — Assessment & Plan Note (Addendum)
For h/o unprovoked DVT Checked CBC from care everywhere

## 2022-02-02 NOTE — Assessment & Plan Note (Signed)
Physical exam as documented. Fasting blood tests ordered. Tdap vaccine today.

## 2022-02-02 NOTE — Assessment & Plan Note (Signed)
Check TSH and free T4

## 2022-02-02 NOTE — Assessment & Plan Note (Signed)
S/p gastric bypass surgery Diet modification and moderate exercise advised

## 2022-02-03 LAB — VITAMIN B12: Vitamin B-12: 1338 pg/mL — ABNORMAL HIGH (ref 232–1245)

## 2022-02-03 LAB — CMP14+EGFR
ALT: 14 IU/L (ref 0–32)
AST: 20 IU/L (ref 0–40)
Albumin/Globulin Ratio: 1.8 (ref 1.2–2.2)
Albumin: 4.1 g/dL (ref 3.9–4.9)
Alkaline Phosphatase: 47 IU/L (ref 44–121)
BUN/Creatinine Ratio: 20 (ref 9–23)
BUN: 15 mg/dL (ref 6–24)
Bilirubin Total: 0.8 mg/dL (ref 0.0–1.2)
CO2: 22 mmol/L (ref 20–29)
Calcium: 8.9 mg/dL (ref 8.7–10.2)
Chloride: 105 mmol/L (ref 96–106)
Creatinine, Ser: 0.74 mg/dL (ref 0.57–1.00)
Globulin, Total: 2.3 g/dL (ref 1.5–4.5)
Glucose: 88 mg/dL (ref 70–99)
Potassium: 4.1 mmol/L (ref 3.5–5.2)
Sodium: 139 mmol/L (ref 134–144)
Total Protein: 6.4 g/dL (ref 6.0–8.5)
eGFR: 103 mL/min/{1.73_m2} (ref >59)

## 2022-02-03 LAB — TSH+FREE T4
Free T4: 1.04 ng/dL (ref 0.82–1.77)
TSH: 0.85 u[IU]/mL (ref 0.450–4.500)

## 2022-02-03 LAB — VITAMIN D 25 HYDROXY (VIT D DEFICIENCY, FRACTURES): Vit D, 25-Hydroxy: 29.5 ng/mL — ABNORMAL LOW (ref 30.0–100.0)

## 2022-03-29 ENCOUNTER — Encounter: Payer: Self-pay | Admitting: Internal Medicine

## 2022-04-04 ENCOUNTER — Encounter: Payer: Self-pay | Admitting: Internal Medicine

## 2022-04-04 ENCOUNTER — Ambulatory Visit (INDEPENDENT_AMBULATORY_CARE_PROVIDER_SITE_OTHER): Payer: No Typology Code available for payment source | Admitting: Internal Medicine

## 2022-04-04 VITALS — BP 127/87 | HR 74 | Ht 66.0 in | Wt 219.4 lb

## 2022-04-04 DIAGNOSIS — G43019 Migraine without aura, intractable, without status migrainosus: Secondary | ICD-10-CM | POA: Diagnosis not present

## 2022-04-04 DIAGNOSIS — R200 Anesthesia of skin: Secondary | ICD-10-CM | POA: Diagnosis not present

## 2022-04-04 DIAGNOSIS — R202 Paresthesia of skin: Secondary | ICD-10-CM

## 2022-04-04 DIAGNOSIS — Z86718 Personal history of other venous thrombosis and embolism: Secondary | ICD-10-CM | POA: Diagnosis not present

## 2022-04-04 NOTE — Assessment & Plan Note (Signed)
Her unilateral headache episodes are likely migraine Imitrex as needed for migraine If frequent episodes, will need maintenance treatment 

## 2022-04-04 NOTE — Progress Notes (Signed)
Acute Office Visit  Subjective:    Patient ID: Allison Valenzuela, female    DOB: 1980-09-30, 42 y.o.   MRN: [id number]  Chief Complaint  Patient presents with   referral wanted    Patient would like to be referred to a neurologist     HPI Patient is in today for complain of an episode of numbness and tingling of the right side of the body, starting from face, right UE, right side of the trunk and right LE, with lasted for few seconds..  She was lying in the bed at the time.  Denies any headache with it.  Denies any blurry vision.  She reports that she had similar episode when she was put on OCPs in 2009.  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  She has history of migraine, but rarely requires sumatriptan.  She currently denies any numbness, tingling or weakness of the UE or LE.  Past Medical History:  Diagnosis Date   Acid reflux    Anemia    Anxiety 09/15/21   Back pain    Bilateral bunions    Chest pain    Constipation    DVT (deep venous thrombosis) (HCC)    right leg   Dyspnea    Encounter for general adult medical examination with abnormal findings 02/02/2022   Gestational diabetes    HTN (hypertension)    Lactose intolerance    Leg edema    Vitamin D deficiency     Past Surgical History:  Procedure Laterality Date   LAPAROSCOPIC VAGINAL HYSTERECTOMY WITH SALPINGECTOMY Bilateral 05/06/2019   Procedure: LAPAROSCOPIC ASSISTED VAGINAL HYSTERECTOMY WITH SALPINGECTOMY;  Surgeon: [name], MD;  Location: [place];  Service: Gynecology;  Laterality: Bilateral;  SITE ALSO VAGINA   ROUX-EN-Y PROCEDURE     TONSILLECTOMY     TOTAL ABDOMINAL HYSTERECTOMY     04/22    Family History  Problem Relation Age of Onset   Hypertension Mother    Heart disease Mother    Thyroid disease Mother    Obesity Mother     Social History   Socioeconomic History   Marital status: Married    Spouse name: Elanah Preston   Number of children: 3   Years of education: Not on file   Highest education level: Not on file  Occupational History   Occupation: Cabin crew  Tobacco Use   Smoking status: Never   Smokeless tobacco: Never  Vaping Use   Vaping Use: Never used  Substance and Sexual Activity   Alcohol use: Not Currently    Comment: Social drinker   Drug use: Never   Sexual activity: Yes    Birth control/protection: None  Other Topics Concern   Not on file  Social History Narrative   Not on file   Social Determinants of Health   Financial Resource Strain: Not on file  Food Insecurity: Not on file  Transportation Needs: Not on file  Physical Activity: Not on file  Stress: Not on file  Social Connections: Not on file  Intimate Partner Violence: Not on file    Outpatient Medications Prior to Visit  Medication Sig Dispense Refill   rivaroxaban (XARELTO) 20 MG TABS tablet Take 20 mg by mouth daily with supper.     SUMAtriptan (IMITREX) 50 MG tablet Take 1 tablet (50 mg total) by mouth every 2 (two) hours as needed for migraine. May repeat in 2 hours if  headache persists or recurs. 10 tablet 0   Vitamin D, Ergocalciferol, (DRISDOL) 1.25 MG (50000 UNIT) CAPS capsule Take 1 capsule (50,000 Units total) by mouth every 7 (seven) days. 4 capsule 0   No facility-administered medications prior to visit.    Allergies  Allergen Reactions   Dye Fdc Blue 1 [Fd&C Blue #1 (Brilliant Blue)] Itching and Rash   Nickel Itching and Rash    Review of Systems  Constitutional:  Negative for chills and fever.  HENT:  Negative for congestion, sinus pressure, sinus pain and sore throat.   Eyes:  Negative for pain and discharge.  Respiratory:  Negative for cough and shortness of breath.   Cardiovascular:  Negative for chest pain and palpitations.  Gastrointestinal:  Negative for abdominal pain, constipation, diarrhea, nausea and vomiting.  Endocrine: Negative for polydipsia and polyuria.   Genitourinary:  Negative for dysuria and hematuria.  Musculoskeletal:  Negative for neck pain and neck stiffness.  Skin:  Negative for rash.  Neurological:  Positive for dizziness and headaches. Negative for weakness.  Psychiatric/Behavioral:  Positive for sleep disturbance. Negative for agitation and behavioral problems.        Objective:    Physical Exam Vitals reviewed.  Constitutional:      General: She is not in acute distress.    Appearance: She is obese. She is not diaphoretic.  HENT:     Head: Normocephalic and atraumatic.     Nose: Nose normal.     Mouth/Throat:     Mouth: Mucous membranes are moist.  Eyes:     General: No scleral icterus.    Extraocular Movements: Extraocular movements intact.  Cardiovascular:     Rate and Rhythm: Normal rate and regular rhythm.     Pulses: Normal pulses.     Heart sounds: No murmur heard. Pulmonary:     Breath sounds: Normal breath sounds. No wheezing or rales.  Musculoskeletal:     Cervical back: Neck supple. No tenderness.     Right lower leg: No edema.     Left lower leg: No edema.  Skin:    General: Skin is warm.     Findings: No rash.  Neurological:     General: No focal deficit present.     Mental Status: She is alert and oriented to person, place, and time.     Cranial Nerves: No cranial nerve deficit.     Sensory: No sensory deficit.     Motor: No weakness.     Coordination: Coordination normal.     Gait: Gait normal.  Psychiatric:        Mood and Affect: Mood normal.        Behavior: Behavior normal.     BP 127/87 (BP Location: Right Arm, Patient Position: Sitting, Cuff Size: Large)   Pulse 74   Ht 5\' 6"  (1.676 m)   Wt 219 lb 6.4 oz (99.5 kg)   LMP 04/08/2019   SpO2 97%   BMI 35.41 kg/m  Wt Readings from Last 3 Encounters:  04/04/22 219 lb 6.4 oz (99.5 kg)  02/02/22 221 lb 12.8 oz (100.6 kg)  07/27/21 215 lb 6.4 oz (97.7 kg)        Assessment & Plan:   Problem List Items Addressed This Visit        Cardiovascular and Mediastinum   Migraine    Her unilateral headache episodes are likely migraine Imitrex as needed for migraine If frequent episodes, will need maintenance treatment  Relevant Orders   Ambulatory referral to Neurology     Other   History of DVT of lower extremity    Unprovoked DVT of RLE in 2019 On Xarelto Followed by Heme/Onc. at [facility]      Numbness and tingling - Primary    Intermittent episode of numbness and tingling Unclear etiology Migraine can cause numbness and tingling, but her episode lasted only for few seconds Referred to neurology for further evaluation      Relevant Orders   Ambulatory referral to Neurology     No orders of the defined types were placed in this encounter.    Lindell Spar, MD

## 2022-04-04 NOTE — Patient Instructions (Signed)
You are being referred to Neurology for evaluation of numbness and tingling.

## 2022-04-04 NOTE — Assessment & Plan Note (Signed)
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 at [facility] [place] 

## 2022-04-04 NOTE — Assessment & Plan Note (Signed)
Intermittent episode of numbness and tingling Unclear etiology Migraine can cause numbness and tingling, but her episode lasted only for few seconds Referred to neurology for further evaluation

## 2022-04-05 ENCOUNTER — Encounter: Payer: Self-pay | Admitting: Neurology

## 2022-06-12 ENCOUNTER — Ambulatory Visit (INDEPENDENT_AMBULATORY_CARE_PROVIDER_SITE_OTHER): Payer: No Typology Code available for payment source | Admitting: Neurology

## 2022-06-12 ENCOUNTER — Encounter: Payer: Self-pay | Admitting: Neurology

## 2022-06-12 VITALS — BP 120/73 | HR 77 | Ht 68.0 in | Wt 211.0 lb

## 2022-06-12 DIAGNOSIS — R200 Anesthesia of skin: Secondary | ICD-10-CM | POA: Diagnosis not present

## 2022-06-12 DIAGNOSIS — G43009 Migraine without aura, not intractable, without status migrainosus: Secondary | ICD-10-CM

## 2022-06-12 NOTE — Patient Instructions (Signed)
Stop Sumatriptan.  At earliest onset of migraine, take Ubrelvy.  You may repeat after 2 hours if needed.  Maximum 2 tablets in 24 hours.  Let me know if it works or not. Check MRI of brain with and without contrast Follow up 6 months.

## 2022-06-12 NOTE — Progress Notes (Signed)
NEUROLOGY CONSULTATION NOTE  Allison Valenzuela MRN: [id number] DOB: [birth year]  Referring provider: Trena Platt, MD Primary care provider: Trena Platt, MD  Reason for consult:  right sided numbness, migraine  Assessment/Plan:   Intermittent right sided numbness - consider migraine aura without headache Migraine without aura, without status migrainosus, not intractable Vague right leg sensory deficit (below to knee) - likely residual related to DVT   Check MRI of brain with and without contrast As she has stroke-like symptoms, will discontinue sumatriptan and have her try samples of Ubrelvy 100mg .  She will update me. Follow up in 6 months    Subjective:  Allison Valenzuela is a 42 year old female with HTN and history of right LE DVT on Xarelto who presents for headache and right sided numbness.   History supplemented by referring provider's note.  In 2009, she developed right sided numbness and tingling of face, torso, arm and leg after starting OCP.  She was advised to discontinue it and symptoms resolved.  She never had an MRI of the brain.  She had a recurrence of similar right sided numbness and tingling around February-March 2024, but this time intermittent lasting 10 minutes and occurring about twice a month.  No associated headache.  She has history of migraines in childhood but she grew out of them.  They restarted in late 2023-early 2024.  They are a moderate right periorbital nonthrobbing pain sometimes associated with nausea, photophobia and phonophobia.  Occurs every 6 to 8 weeks.  Lasts 3 days but aborts quickly with sumatriptan (although it causes head pressure).  May be associated with poor sleep, stress and alcohol.  In June 2019, she developed right lower extremity DVT.  She is on chronic Xarelto as no cause was found.  In early 2020, she started experiencing strange sensation in the right leg below the knee that would make it difficult to bear weight.  No pain or  actual numbness although she has prior history of right sciatica.   immediate.  Once every 6-8 weeks.   ENT- sinuses cleared     Past NSAIDS/analgesics:  Excedrin Past abortive triptans:  none Past abortive ergotamine:  none Past muscle relaxants:  none Past anti-emetic:  none Past antihypertensive medications:  none Past antidepressant medications:  none Past anticonvulsant medications:  none Past anti-CGRP:  none Past vitamins/Herbal/Supplements:  B12, D Past antihistamines/decongestants:  Sudafed  Current NSAIDS/analgesics:  none Current triptans:  sumatriptan 50mg  Current ergotamine:  none Current anti-emetic:  none Current muscle relaxants:  none Current Antihypertensive medications:  none Current Antidepressant medications:  none Current Anticonvulsant medications:  none Current anti-CGRP:  none Current Vitamins/Herbal/Supplements:  D Current Antihistamines/Decongestants:  none Other therapy:  none Birth control:  none Other medications:  Xarelto       PAST MEDICAL HISTORY: Past Medical History:  Diagnosis Date   Acid reflux    Anemia    Anxiety 09/15/21   Back pain    Bilateral bunions    Chest pain    Constipation    DVT (deep venous thrombosis) (HCC)    right leg   Dyspnea    Encounter for general adult medical examination with abnormal findings 02/02/2022   Gestational diabetes    HTN (hypertension)    Lactose intolerance    Leg edema    Vitamin D deficiency     PAST SURGICAL HISTORY: Past Surgical History:  Procedure Laterality Date   LAPAROSCOPIC VAGINAL HYSTERECTOMY WITH SALPINGECTOMY Bilateral 05/06/2019   Procedure: LAPAROSCOPIC  ASSISTED VAGINAL HYSTERECTOMY WITH SALPINGECTOMY;  Surgeon: Geryl Rankins, MD;  Location: [facility];  Service: Gynecology;  Laterality: Bilateral;  SITE ALSO VAGINA   ROUX-EN-Y PROCEDURE     TONSILLECTOMY     TOTAL ABDOMINAL HYSTERECTOMY     04/22    MEDICATIONS: Current Outpatient  Medications on File Prior to Visit  Medication Sig Dispense Refill   rivaroxaban (XARELTO) 20 MG TABS tablet Take 20 mg by mouth daily with supper.     SUMAtriptan (IMITREX) 50 MG tablet Take 1 tablet (50 mg total) by mouth every 2 (two) hours as needed for migraine. May repeat in 2 hours if headache persists or recurs. 10 tablet 0   Vitamin D, Ergocalciferol, (DRISDOL) 1.25 MG (50000 UNIT) CAPS capsule Take 1 capsule (50,000 Units total) by mouth every 7 (seven) days. 4 capsule 0   No current facility-administered medications on file prior to visit.    ALLERGIES: Allergies  Allergen Reactions   Dye Fdc Blue 1 [Fd&C Blue #1 (Brilliant Blue)] Itching and Rash   Nickel Itching and Rash    FAMILY HISTORY: Family History  Problem Relation Age of Onset   Hypertension Mother    Heart disease Mother    Thyroid disease Mother    Obesity Mother     Objective:  Blood pressure 120/73, pulse 77, height 5\' 8"  (1.727 m), weight 211 lb (95.7 kg), last menstrual period 04/08/2019, SpO2 98 %. General: No acute distress.  Patient appears well-groomed.   Head:  Normocephalic/atraumatic Eyes:  fundi examined but not visualized Neck: supple, no paraspinal tenderness, full range of motion Back: No paraspinal tenderness Heart: regular rate and rhythm Neurological Exam: Mental status: alert and oriented to person, place, and time, speech fluent and not dysarthric, language intact. Cranial nerves: CN I: not tested CN II: pupils equal, round and reactive to light, visual fields intact CN III, IV, VI:  full range of motion, no nystagmus, no ptosis CN V: facial sensation intact. CN VII: upper and lower face symmetric CN VIII: hearing intact CN IX, X: gag intact, uvula midline CN XI: sternocleidomastoid and trapezius muscles intact CN XII: tongue midline Bulk & Tone: normal, no fasciculations. Motor:  muscle strength 5/5 throughout Sensation:  Pinprick sensation slightly reduced in right foot,  otherwise normal. Vibratory sensation intact. Deep Tendon Reflexes:  2+ throughout,  toes downgoing.   Finger to nose testing:  Without dysmetria.    Gait:  Normal station and stride.  Able to turn and tandem walk.  Romberg negative.    Thank you for allowing me to take part in the care of this patient.  Shon Millet, DO  CC: Trena Platt, MD

## 2022-06-15 ENCOUNTER — Encounter: Payer: Self-pay | Admitting: Neurology

## 2022-06-18 ENCOUNTER — Other Ambulatory Visit: Payer: Self-pay | Admitting: Neurology

## 2022-06-18 MED ORDER — UBRELVY 100 MG PO TABS: 1.0000 | ORAL_TABLET | ORAL | 11 refills | Status: DC | PRN

## 2022-06-25 ENCOUNTER — Ambulatory Visit: Payer: No Typology Code available for payment source | Admitting: Internal Medicine

## 2022-06-29 ENCOUNTER — Telehealth: Payer: Self-pay

## 2022-06-29 NOTE — Telephone Encounter (Signed)
Patient called to check on PA for Ubrelvy. No notes found in chart.   Please advise. Thanks!

## 2022-06-29 NOTE — Telephone Encounter (Signed)
PA needed for Ubrelvy.

## 2022-07-04 ENCOUNTER — Telehealth: Payer: Self-pay

## 2022-07-04 ENCOUNTER — Other Ambulatory Visit (HOSPITAL_COMMUNITY): Payer: Self-pay

## 2022-07-04 NOTE — Telephone Encounter (Signed)
Patient Advocate Encounter   Received notification from [employer] that prior authorization is required for Ubrelvy 100MG  tablets   Submitted: 07-04-2022 Key [id number]  Status is pending

## 2022-07-12 NOTE — Telephone Encounter (Signed)
Patient Advocate Encounter  Prior Authorization for Bernita Raisin 100MG  tablets has been approved through [employer].    Key: [id number]  Effective: 07-04-2022 to 07-04-2023

## 2022-07-18 ENCOUNTER — Encounter: Payer: Self-pay | Admitting: Neurology

## 2022-07-20 ENCOUNTER — Ambulatory Visit
Admission: RE | Admit: 2022-07-20 | Discharge: 2022-07-20 | Disposition: A | Discharge status: Home / Self Care | Payer: No Typology Code available for payment source | Source: Ambulatory Visit | Attending: Neurology | Admitting: Neurology

## 2022-07-20 MED ORDER — GADOPICLENOL 0.5 MMOL/ML IV SOLN
10.0000 mL | Freq: Once | INTRAVENOUS | Status: AC | PRN
  Administered 2022-07-20: 10 mL via INTRAVENOUS

## 2022-07-30 ENCOUNTER — Telehealth: Payer: Self-pay

## 2022-07-30 DIAGNOSIS — R9089 Other abnormal findings on diagnostic imaging of central nervous system: Secondary | ICD-10-CM

## 2022-07-30 NOTE — Telephone Encounter (Signed)
-----   Message from Allison Valenzuela sent at 07/30/2022 12:32 PM EDT ----- I spoke with [name] about the MRI results.  Will need to further evaluate for MS.  Please order MRI of cervical and thoracic spine with and without contrast.  For diagnosis, list right sided numbness and abnormal brain MRI

## 2022-08-03 ENCOUNTER — Ambulatory Visit: Payer: No Typology Code available for payment source | Admitting: Podiatry

## 2022-08-06 ENCOUNTER — Ambulatory Visit: Payer: No Typology Code available for payment source | Admitting: Internal Medicine

## 2022-08-06 VITALS — BP 105/69 | HR 93 | Ht 66.0 in | Wt 208.8 lb

## 2022-08-06 DIAGNOSIS — N3001 Acute cystitis with hematuria: Secondary | ICD-10-CM

## 2022-08-06 DIAGNOSIS — Z9884 Bariatric surgery status: Secondary | ICD-10-CM | POA: Diagnosis not present

## 2022-08-06 DIAGNOSIS — E782 Mixed hyperlipidemia: Secondary | ICD-10-CM | POA: Diagnosis not present

## 2022-08-06 DIAGNOSIS — Z7901 Long term (current) use of anticoagulants: Secondary | ICD-10-CM | POA: Diagnosis not present

## 2022-08-06 DIAGNOSIS — N3000 Acute cystitis without hematuria: Secondary | ICD-10-CM

## 2022-08-06 LAB — POCT URINALYSIS DIP (CLINITEK)
Bilirubin, UA: NEGATIVE
Blood, UA: NEGATIVE
Glucose, UA: NEGATIVE mg/dL
Ketones, POC UA: NEGATIVE mg/dL
Nitrite, UA: NEGATIVE
POC PROTEIN,UA: NEGATIVE
Spec Grav, UA: 1.025 (ref 1.010–1.025)
Urobilinogen, UA: 0.2 E.U./dL
pH, UA: 5.5 (ref 5.0–8.0)

## 2022-08-06 MED ORDER — SULFAMETHOXAZOLE-TRIMETHOPRIM 800-160 MG PO TABS
1.0000 | ORAL_TABLET | Freq: Two times a day (BID) | ORAL | 0 refills | Status: DC
Start: 2022-08-06 — End: 2022-08-13

## 2022-08-06 NOTE — Patient Instructions (Signed)
Urinary Tract Infection, Adult  A urinary tract infection (UTI) is an infection of any part of the urinary tract. The urinary tract includes the kidneys, ureters, bladder, and urethra. These organs make, store, and get rid of urine in the body. An upper UTI affects the ureters and kidneys. A lower UTI affects the bladder and urethra. What are the causes? Most urinary tract infections are caused by bacteria in your genital area around your urethra, where urine leaves your body. These bacteria grow and cause inflammation of your urinary tract. What increases the risk? You are more likely to develop this condition if: You have a urinary catheter that stays in place. You are not able to control when you urinate or have a bowel movement (incontinence). You are female and you: Use a spermicide or diaphragm for birth control. Have low estrogen levels. Are pregnant. You have certain genes that increase your risk. You are sexually active. You take antibiotic medicines. You have a condition that causes your flow of urine to slow down, such as: An enlarged prostate, if you are female. Blockage in your urethra. A kidney stone. A nerve condition that affects your bladder control (neurogenic bladder). Not getting enough to drink, or not urinating often. You have certain medical conditions, such as: Diabetes. A weak disease-fighting system (immunesystem). Sickle cell disease. Gout. Spinal cord injury. What are the signs or symptoms? Symptoms of this condition include: Needing to urinate right away (urgency). Frequent urination. This may include small amounts of urine each time you urinate. Pain or burning with urination. Blood in the urine. Urine that smells bad or unusual. Trouble urinating. Cloudy urine. Vaginal discharge, if you are female. Pain in the abdomen or the lower back. You may also have: Vomiting or a decreased appetite. Confusion. Irritability or tiredness. A fever or  chills. Diarrhea. The first symptom in older adults may be confusion. In some cases, they may not have any symptoms until the infection has worsened. How is this diagnosed? This condition is diagnosed based on your medical history and a physical exam. You may also have other tests, including: Urine tests. Blood tests. Tests for STIs (sexually transmitted infections). If you have had more than one UTI, a cystoscopy or imaging studies may be done to determine the cause of the infections. How is this treated? Treatment for this condition includes: Antibiotic medicine. Over-the-counter medicines to treat discomfort. Drinking enough water to stay hydrated. If you have frequent infections or have other conditions such as a kidney stone, you may need to see a health care provider who specializes in the urinary tract (urologist). In rare cases, urinary tract infections can cause sepsis. Sepsis is a life-threatening condition that occurs when the body responds to an infection. Sepsis is treated in the hospital with IV antibiotics, fluids, and other medicines. Follow these instructions at home:  Medicines Take over-the-counter and prescription medicines only as told by your health care provider. If you were prescribed an antibiotic medicine, take it as told by your health care provider. Do not stop using the antibiotic even if you start to feel better. General instructions Make sure you: Empty your bladder often and completely. Do not hold urine for long periods of time. Empty your bladder after sex. Wipe from front to back after urinating or having a bowel movement if you are female. Use each tissue only one time when you wipe. Drink enough fluid to keep your urine pale yellow. Keep all follow-up visits. This is important. Contact a health   care provider if: Your symptoms do not get better after 1-2 days. Your symptoms go away and then return. Get help right away if: You have severe pain in  your back or your lower abdomen. You have a fever or chills. You have nausea or vomiting. Summary A urinary tract infection (UTI) is an infection of any part of the urinary tract, which includes the kidneys, ureters, bladder, and urethra. Most urinary tract infections are caused by bacteria in your genital area. Treatment for this condition often includes antibiotic medicines. If you were prescribed an antibiotic medicine, take it as told by your health care provider. Do not stop using the antibiotic even if you start to feel better. Keep all follow-up visits. This is important. This information is not intended to replace advice given to you by your health care provider. Make sure you discuss any questions you have with your health care provider. Document Revised: 08/09/2019 Document Reviewed: 08/14/2019 Elsevier Patient Education  2024 [employer]  

## 2022-08-06 NOTE — Progress Notes (Unsigned)
Acute Office Visit  Subjective:    Patient ID: Allison Valenzuela, female    DOB: [birth year], 42 y.o.   MRN: [id number]  Chief Complaint  Patient presents with   Urinary Tract Infection    Patient thinks she has a uti, she noticed her urine Is dark and had blood this AM    HPI Patient is in today for complaint of dark urine and lower abdominal discomfort for the last 3 days.  She also noticed pinkish urine this morning.  Of note, she takes Xarelto for history of PE.  Denies any fever, chills, urinary frequency, nausea or vomiting currently.  Denies any flank pain.  Past Medical History:  Diagnosis Date   Acid reflux    Anemia    Anxiety 09/15/21   Back pain    Bilateral bunions    Chest pain    Constipation    DVT (deep venous thrombosis) (HCC)    right leg   Dyspnea    Encounter for general adult medical examination with abnormal findings 02/02/2022   Gestational diabetes    HTN (hypertension)    Lactose intolerance    Leg edema    Vitamin D deficiency     Past Surgical History:  Procedure Laterality Date   LAPAROSCOPIC VAGINAL HYSTERECTOMY WITH SALPINGECTOMY Bilateral 05/06/2019   Procedure: LAPAROSCOPIC ASSISTED VAGINAL HYSTERECTOMY WITH SALPINGECTOMY;  Surgeon: Geryl Rankins, MD;  Location: [facility] [place];  Service: Gynecology;  Laterality: Bilateral;  SITE ALSO VAGINA   ROUX-EN-Y PROCEDURE     TONSILLECTOMY     TOTAL ABDOMINAL HYSTERECTOMY     04/22    Family History  Problem Relation Age of Onset   Hypertension Mother    Heart disease Mother    Thyroid disease Mother    Obesity Mother    Stroke Maternal Aunt    Stroke Maternal Uncle    Migraines Maternal Uncle     Social History   Socioeconomic History   Marital status: Married    Spouse name: Oneal Biglow   Number of children: 3   Years of education: Not on file   Highest education level: Associate degree: academic program  Occupational History   Occupation: Tree surgeon   Tobacco Use   Smoking status: Never   Smokeless tobacco: Never  Vaping Use   Vaping status: Never Used  Substance and Sexual Activity   Alcohol use: Not Currently    Comment: Social drinker   Drug use: Never   Sexual activity: Yes    Birth control/protection: None  Other Topics Concern   Not on file  Social History Narrative   Not on file   Social Determinants of Health   Financial Resource Strain: Low Risk  (08/06/2022)   Overall Financial Resource Strain (CARDIA)    Difficulty of Paying Living Expenses: Not hard at all  Food Insecurity: No Food Insecurity (08/06/2022)   Hunger Vital Sign    Worried About Running Out of Food in the Last Year: Never true    Ran Out of Food in the Last Year: Never true  Transportation Needs: No Transportation Needs (08/06/2022)   PRAPARE - Administrator, Civil Service (Medical): No    Lack of Transportation (Non-Medical): No  Physical Activity: Sufficiently Active (08/06/2022)   Exercise Vital Sign    Days of Exercise per Week: 5 days    Minutes of Exercise per Session: 30 min  Stress: No Stress Concern Present (08/06/2022)   [employer] of Occupational  Health - Occupational Stress Questionnaire    Feeling of Stress : Not at all  Social Connections: Moderately Isolated (08/06/2022)   Social Connection and Isolation Panel [NHANES]    Frequency of Communication with Friends and Family: Three times a week    Frequency of Social Gatherings with Friends and Family: Once a week    Attends Religious Services: Never    Database administrator or Organizations: No    Attends Engineer, structural: Not on file    Marital Status: Married  Catering manager Violence: Not on file    Outpatient Medications Prior to Visit  Medication Sig Dispense Refill   Ubrogepant (UBRELVY) 100 MG TABS Take 1 tablet (100 mg total) by mouth as needed. May repeat after 2 hours.  Maximum 2 tablets in 24 hours. 16 tablet 11   rivaroxaban (XARELTO)  20 MG TABS tablet Take 20 mg by mouth daily with supper.     Vitamin D, Ergocalciferol, (DRISDOL) 1.25 MG (50000 UNIT) CAPS capsule Take 1 capsule (50,000 Units total) by mouth every 7 (seven) days. 4 capsule 0   No facility-administered medications prior to visit.    Allergies  Allergen Reactions   Dye Fdc Blue 1 [Fd&C Blue #1 (Brilliant Blue)] Itching and Rash   Nickel Itching and Rash    Review of Systems  Constitutional:  Negative for chills and fever.  HENT:  Negative for congestion and sore throat.   Eyes:  Negative for pain and discharge.  Respiratory:  Negative for cough and shortness of breath.   Cardiovascular:  Negative for chest pain and palpitations.  Gastrointestinal:  Negative for abdominal pain, diarrhea, nausea and vomiting.  Endocrine: Negative for polydipsia and polyuria.  Genitourinary:  Positive for hematuria. Negative for dysuria.  Musculoskeletal:  Negative for neck pain and neck stiffness.  Skin:  Negative for rash.  Neurological:  Negative for weakness.  Psychiatric/Behavioral:  Positive for sleep disturbance. Negative for agitation and behavioral problems.        Objective:    Physical Exam Vitals reviewed.  Constitutional:      General: She is not in acute distress.    Appearance: She is obese. She is not diaphoretic.  HENT:     Head: Normocephalic and atraumatic.     Nose: Nose normal.     Mouth/Throat:     Mouth: Mucous membranes are moist.  Eyes:     General: No scleral icterus.    Extraocular Movements: Extraocular movements intact.  Cardiovascular:     Rate and Rhythm: Normal rate and regular rhythm.     Heart sounds: Normal heart sounds. No murmur heard. Pulmonary:     Breath sounds: Normal breath sounds. No wheezing or rales.  Abdominal:     Palpations: Abdomen is soft.     Tenderness: There is abdominal tenderness (Mild, suprapubic). There is no right CVA tenderness or left CVA tenderness.  Musculoskeletal:     Cervical back: Neck  supple. No tenderness.  Skin:    General: Skin is warm.     Findings: No rash.  Neurological:     General: No focal deficit present.     Mental Status: She is alert and oriented to person, place, and time.  Psychiatric:        Mood and Affect: Mood normal.        Behavior: Behavior normal.     BP 105/69 (BP Location: Left Arm, Patient Position: Sitting, Cuff Size: Large)   Pulse 93  Ht 5\' 6"  (1.676 m)   Wt 208 lb 12.8 oz (94.7 kg)   LMP 04/08/2019   SpO2 97%   BMI 33.70 kg/m  Wt Readings from Last 3 Encounters:  08/06/22 208 lb 12.8 oz (94.7 kg)  06/12/22 211 lb (95.7 kg)  04/04/22 219 lb 6.4 oz (99.5 kg)        Assessment & Plan:   Problem List Items Addressed This Visit       Genitourinary   Acute cystitis with hematuria - Primary    UA reviewed - LE positive, check urine culture Started empiric Bactrim Advised to maintain adequate hydration Unclear if she had episode of hematuria, but could be related to UTI and/or Xarelto use       Relevant Medications   sulfamethoxazole-trimethoprim (BACTRIM DS) 800-160 MG tablet   Other Relevant Orders   Urine Culture   POCT URINALYSIS DIP (CLINITEK) (Completed)   CMP14+EGFR   CBC with Differential/Platelet     Other   History of gastric bypass    Needs to take bariatric multivitamin Check CMP B12 level was wnl in 01/24      Relevant Orders   CMP14+EGFR   Other Visit Diagnoses     Chronic anticoagulation       Relevant Orders   CBC with Differential/Platelet   Mixed hyperlipidemia       Relevant Orders   Lipid Profile        Meds ordered this encounter  Medications   sulfamethoxazole-trimethoprim (BACTRIM DS) 800-160 MG tablet    Sig: Take 1 tablet by mouth 2 (two) times daily.    Dispense:  10 tablet    Refill:  0     Ronald Vinsant Concha Se, MD

## 2022-08-07 DIAGNOSIS — N3001 Acute cystitis with hematuria: Secondary | ICD-10-CM | POA: Insufficient documentation

## 2022-08-07 NOTE — Assessment & Plan Note (Signed)
UA reviewed - LE positive, check urine culture Started empiric Bactrim Advised to maintain adequate hydration Unclear if she had episode of hematuria, but could be related to UTI and/or Xarelto use

## 2022-08-07 NOTE — Assessment & Plan Note (Addendum)
Needs to take bariatric multivitamin Check CMP B12 level was wnl in 01/24

## 2022-08-10 ENCOUNTER — Ambulatory Visit: Payer: No Typology Code available for payment source | Admitting: Internal Medicine

## 2022-08-13 ENCOUNTER — Encounter: Payer: Self-pay | Admitting: Internal Medicine

## 2022-08-13 ENCOUNTER — Other Ambulatory Visit: Payer: No Typology Code available for payment source

## 2022-08-13 ENCOUNTER — Other Ambulatory Visit: Payer: Self-pay | Admitting: Internal Medicine

## 2022-08-13 DIAGNOSIS — N3001 Acute cystitis with hematuria: Secondary | ICD-10-CM

## 2022-08-13 MED ORDER — AMOXICILLIN-POT CLAVULANATE 875-125 MG PO TABS
1.0000 | ORAL_TABLET | Freq: Two times a day (BID) | ORAL | 0 refills | Status: DC
Start: 2022-08-13 — End: 2022-08-30

## 2022-08-16 ENCOUNTER — Ambulatory Visit
Admission: RE | Admit: 2022-08-16 | Discharge: 2022-08-16 | Disposition: A | Discharge status: Home / Self Care | Payer: No Typology Code available for payment source | Source: Ambulatory Visit | Attending: Neurology | Admitting: Neurology

## 2022-08-16 DIAGNOSIS — R9089 Other abnormal findings on diagnostic imaging of central nervous system: Secondary | ICD-10-CM

## 2022-08-16 MED ORDER — GADOPICLENOL 0.5 MMOL/ML IV SOLN
10.0000 mL | Freq: Once | INTRAVENOUS | Status: AC | PRN
  Administered 2022-08-16: 10 mL via INTRAVENOUS

## 2022-08-28 ENCOUNTER — Other Ambulatory Visit: Payer: Self-pay

## 2022-08-28 ENCOUNTER — Encounter: Payer: Self-pay | Admitting: Internal Medicine

## 2022-08-28 DIAGNOSIS — E559 Vitamin D deficiency, unspecified: Secondary | ICD-10-CM

## 2022-08-29 NOTE — Progress Notes (Unsigned)
NEUROLOGY FOLLOW UP OFFICE NOTE  Allison Valenzuela [id number]  Assessment/Plan:   Multiple sclerosis, relapsing-remitting. Migraine without aura, without status migrainosus, not intractable Leukopenia, unknown etiology      DMT:  Plan to start Ocrevus vs Tysabri Check JCV antibody and index, hepatitis panel, TB, immunoglobulin panel, ACE Repeat CBC with diff, CMP, immunoglobulin panel, vit D in 6 months. 6 months after starting medication, recheck MRI of brain/cervical/thoracic with and without contrast Follow up in 6 months.  Total Time spent reviewing MRI and face to face with patient discussing diagnosis and plan:  35 minutes  Subjective:  Allison Valenzuela is a 42 year old female with HTN and history of right LE DVT on Xarelto who follows up to discuss MRI results.  MRI of brain and spinal cord personally reviewed.  UPDATE: MRI of brain with and without contrast on 07/20/2022 revealed primarily periventricular T2/FLAIR hyperintensities in the cerebral white matter.  Follow up MRI of cervical and thoracic spine with and without contrast on 08/16/2022 non-enhancing hyperintense lesion at the C2 level of spinal cord as well as T2 hyperintense lesion at the T2-T3 level of spinal cord with possible mild contrast enhancement.  Overall doing well.  She reports that her right arm feels heavy but no objective weakness.    Of note, she has history of leukopenia with most recent WBC from 8/13 2.4 with ALC 1.1.  Over the past several years, WBC has ranged from 2.3 to 4.9.    Current NSAIDS/analgesics:  none Current triptans:  sumatriptan 50mg  Current ergotamine:  none Current anti-emetic:  none Current muscle relaxants:  none Current Antihypertensive medications:  none Current Antidepressant medications:  none Current Anticonvulsant medications:  none Current anti-CGRP:  none Current Vitamins/Herbal/Supplements:  D Current Antihistamines/Decongestants:  none Other therapy:   none Birth control:  none Other medications:  Xarelto   HISTORY: In 2009, she developed right sided numbness and tingling of face, torso, arm and leg after starting OCP.  She was advised to discontinue it and symptoms resolved.  She never had an MRI of the brain.   She had a recurrence of similar right sided numbness and tingling around February-March 2024, but this time intermittent lasting 10 minutes and occurring about twice a month.  No associated headache.   She has history of migraines in childhood but she grew out of them.  They restarted in late 2023-early 2024.  They are a moderate right periorbital nonthrobbing pain sometimes associated with nausea, photophobia and phonophobia.  Occurs every 6 to 8 weeks.  Lasts 3 days but aborts quickly with sumatriptan (although it causes head pressure).  May be associated with poor sleep, stress and alcohol.   In June 2019, she developed right lower extremity DVT.  She is on chronic Xarelto as no cause was found.  In early 2020, she started experiencing strange sensation in the right leg below the knee that would make it difficult to bear weight.  No pain or actual numbness although she has prior history of right sciatica.   immediate.  Once every 6-8 weeks.   ENT- sinuses cleared         Past NSAIDS/analgesics:  Excedrin Past abortive triptans:  none Past abortive ergotamine:  none Past muscle relaxants:  none Past anti-emetic:  none Past antihypertensive medications:  none Past antidepressant medications:  none Past anticonvulsant medications:  none Past anti-CGRP:  none Past vitamins/Herbal/Supplements:  B12, D Past antihistamines/decongestants:  Sudafed     PAST MEDICAL HISTORY:  Past Medical History:  Diagnosis Date   Acid reflux    Anemia    Anxiety 09/15/21   Back pain    Bilateral bunions    Chest pain    Constipation    DVT (deep venous thrombosis) (HCC)    right leg   Dyspnea    Encounter for general adult medical  examination with abnormal findings 02/02/2022   Gestational diabetes    HTN (hypertension)    Lactose intolerance    Leg edema    Vitamin D deficiency     MEDICATIONS: Current Outpatient Medications on File Prior to Visit  Medication Sig Dispense Refill   Ubrogepant (UBRELVY) 100 MG TABS Take 1 tablet (100 mg total) by mouth as needed. May repeat after 2 hours.  Maximum 2 tablets in 24 hours. 16 tablet 11   amoxicillin-clavulanate (AUGMENTIN) 875-125 MG tablet Take 1 tablet by mouth 2 (two) times daily. 10 tablet 0   rivaroxaban (XARELTO) 20 MG TABS tablet Take 20 mg by mouth daily with supper.     Vitamin D, Ergocalciferol, (DRISDOL) 1.25 MG (50000 UNIT) CAPS capsule Take 1 capsule (50,000 Units total) by mouth every 7 (seven) days. 4 capsule 0   No current facility-administered medications on file prior to visit.    ALLERGIES: Allergies  Allergen Reactions   Dye Fdc Blue 1 [Fd&C Blue #1 (Brilliant Blue)] Itching and Rash   Nickel Itching and Rash    FAMILY HISTORY: Family History  Problem Relation Age of Onset   Hypertension Mother    Heart disease Mother    Thyroid disease Mother    Obesity Mother    Stroke Maternal Aunt    Stroke Maternal Uncle    Migraines Maternal Uncle       Objective:  Blood pressure 114/73, pulse 76, height 5\' 6"  (1.676 m), weight 204 lb 9.6 oz (92.8 kg), last menstrual period 04/08/2019, SpO2 98%. General: No acute distress.  Patient appears well-groomed.   Head:  Normocephalic/atraumatic Eyes:  Fundi examined but not visualized Neck: supple, no paraspinal tenderness, full range of motion Heart:  Regular rate and rhythm Lungs:  Clear to auscultation bilaterally Back: No paraspinal tenderness Neurological Exam: alert and oriented.  Speech fluent and not dysarthric, language intact.  CN II-XII intact. Bulk and tone normal, muscle strength 5/5 throughout.  Sensation to pinprick and vibration intact.  Deep tendon reflexes 2+ throughout, toes  downgoing.  Finger to nose testing intact.  Gait normal, Romberg negative.   Shon Millet, DO  CC: Trena Platt, MD

## 2022-08-30 ENCOUNTER — Encounter: Payer: Self-pay | Admitting: Neurology

## 2022-08-30 ENCOUNTER — Ambulatory Visit (INDEPENDENT_AMBULATORY_CARE_PROVIDER_SITE_OTHER): Payer: No Typology Code available for payment source | Admitting: Neurology

## 2022-08-30 VITALS — BP 114/73 | HR 76 | Ht 66.0 in | Wt 204.6 lb

## 2022-08-30 DIAGNOSIS — G35 Multiple sclerosis: Secondary | ICD-10-CM | POA: Diagnosis not present

## 2022-08-30 DIAGNOSIS — G43009 Migraine without aura, not intractable, without status migrainosus: Secondary | ICD-10-CM

## 2022-08-30 DIAGNOSIS — D72819 Decreased white blood cell count, unspecified: Secondary | ICD-10-CM | POA: Diagnosis not present

## 2022-08-30 LAB — SPECIMEN STATUS REPORT

## 2022-08-30 NOTE — Patient Instructions (Addendum)
Consider OCREVUS or TYSABRI (however, if the JC virus is positive, we will go ahead and start Ocrevus) Check JCV antibody and index, hepatitis panel, TB, immunoglobulin panel, ACE Repeat CBC with diff, CMP, immunoglobulin panel, vit D in 6 months. 6 months after starting medication, recheck MRI of brain/cervical/thoracic with and without contrast       OCREVUS: Ocrelizumab Injection What is this medication? OCRELIZUMAB (ok re LIZ ue mab) treats multiple sclerosis (MS). It works by slowing down an overactive immune system, which prevents or delays worsening symptoms. It also decreases the number of flare-ups. It is not a cure for MS. It belongs to a group of medications called monoclonal antibodies. This medicine may be used for other purposes; ask your health care provider or pharmacist if you have questions. COMMON BRAND NAME(S): OCREVUS What should I tell my care team before I take this medication? They need to know if you have any of these conditions: Cancer Hepatitis B infection Infection especially a viral infection, such as chickenpox, cold sores, herpes An unusual or allergic reaction to ocrelizumab, other medications, foods, dyes or preservatives Pregnant or trying to get pregnant Breast-feeding How should I use this medication? This medicine is for injection into a vein. It is given by your care team in a hospital or clinic setting. A special MedGuide will be given to you before each treatment. Be sure to read this information carefully each time. Talk to your care team about the use of this medication in children. Special care may be needed. Overdosage: If you think you have taken too much of this medicine contact a poison control center or emergency room at once. NOTE: This medicine is only for you. Do not share this medicine with others. What if I miss a dose? Keep appointments for follow-up doses. It is important not to miss your dose. Call your care team if you are unable  to keep an appointment. What may interact with this medication? Alemtuzumab Anifrolumab Belimumab Daclizumab Dimethyl fumarate Diroximel fumarate Fingolimod Glatiramer Interferon beta Live virus vaccines Mitoxantrone Monomethyl fumarate Natalizumab Ofatumumab Ozanimod Peginterferon beta Ponesimod Rituximab Siponimod Steroid medications like prednisone or cortisone Teriflunomide This list may not describe all possible interactions. Give your health care provider a list of all the medicines, herbs, non-prescription drugs, or dietary supplements you use. Also tell them if you smoke, drink alcohol, or use illegal drugs. Some items may interact with your medicine. What should I watch for while using this medication? Your condition will be monitored carefully while you are receiving this medication. This medication can cause serious allergic reactions. To reduce the risk, your care team may give you other medications to take before receiving this one. Be sure to follow the directions from your care team. Talk to your care team if you wish to become pregnant or think you might be pregnant. This medication can cause serious birth defects. Effective contraception is recommended during and for 6 months after stopping treatment. This medication may increase your risk of getting an infection. Call your care team for advice if you get a fever, chills, sore throat, or other symptoms of a cold or flu. Do not treat yourself. Try to avoid being around people who are sick. If you have hepatitis B, talk to your care team if you plan to stop this medication. The symptoms of hepatitis B may get worse if you stop this medication. In some patients, this medication may cause a serious brain infection that may cause death. If you have  any problems seeing, thinking, speaking, walking, or standing, tell your care team right away. If you cannot reach your care team, urgently seek other source of medical  care. This medication can decrease the response to a vaccine. If you need to get vaccinated, tell your care team if you have received this medication. Extra booster doses may be needed. Talk to your care team to see if a different vaccination schedule is needed. Talk to your care team about your risk of cancer. You may be more at risk for certain types of cancer if you take this medication. What side effects may I notice from receiving this medication? Side effects that you should report to your care team as soon as possible: Allergic reactions--skin rash, itching, hives, swelling of the face, lips, tongue, or throat Breast tissue changes, new lumps, redness, pain, or discharge from the nipple Dizziness, loss of balance or coordination, confusion or trouble speaking Infection--fever, chills, cough, sore throat, wounds that don't heal, pain or trouble when passing urine, general feeling of discomfort or being unwell Infusion reactions--chest pain, shortness of breath or trouble breathing, feeling faint or lightheaded Liver injury--right upper belly pain, loss of appetite, nausea, light-colored stool, dark yellow or brown urine, yellowing skin or eyes, unusual weakness or fatigue Sudden or severe stomach pain, bloody diarrhea, fever, nausea, vomiting Side effects that usually do not require medical attention (report to your care team if they continue or are bothersome): Back pain Diarrhea Swelling of the ankles, hands, or feet Worsening mood, feelings of depression This list may not describe all possible side effects. Call your doctor for medical advice about side effects. You may report side effects to FDA at 1-800-FDA-1088. Where should I keep my medication? This medication is given in a hospital or clinic. It will not be stored at home. NOTE: This sheet is a summary. It may not cover all possible information. If you have questions about this medicine, talk to your doctor, pharmacist, or health  care provider.  2024 Elsevier/Gold Standard (2020-10-03 00:00:00)       TYSABRI Natalizumab Injection What is this medication? NATALIZUMAB (na ta LIZ you mab) treats multiple sclerosis (MS). It works by slowing down an overactive immune system, which prevents or delays worsening symptoms. It also decreases the number of flare-ups. It may also be used to treat Crohn's disease. It is a monoclonal antibody. This medicine may be used for other purposes; ask your health care provider or pharmacist if you have questions. COMMON BRAND NAME(S): Tysabri What should I tell my care team before I take this medication? They need to know if you have any of these conditions: Immune system problems Progressive multifocal leukoencephalopathy (PML) An unusual or allergic reaction to natalizumab, other medications, foods, dyes, or preservatives Pregnant or trying to get pregnant Breast-feeding How should I use this medication? This medication is injected into a vein. It is given by your care team in a hospital or clinic setting. A special MedGuide will be given to you by the pharmacist with each prescription and refill. Be sure to read this information carefully each time. Talk to your care team about the use of this medication in children. Special care may be needed. Overdosage: If you think you have taken too much of this medicine contact a poison control center or emergency room at once. NOTE: This medicine is only for you. Do not share this medicine with others. What if I miss a dose? Keep appointments for follow-up doses. It is important not  to miss your dose. Call your care team if you are unable to keep an appointment. What may interact with this medication? Do not take this medication with any of the following: Biologic medications, such as adalimumab, certolizumab, etanercept, golimumab, infliximab This medication may also interact with the  following: Azathioprine Cyclosporine Interferons Mercaptopurine Methotrexate Other medications that lower your chance of fighting an infection Steroid medications, such as prednisone or cortisone Vaccines This list may not describe all possible interactions. Give your health care provider a list of all the medicines, herbs, non-prescription drugs, or dietary supplements you use. Also tell them if you smoke, drink alcohol, or use illegal drugs. Some items may interact with your medicine. What should I watch for while using this medication? Your condition will be monitored carefully while you are receiving this medication. Tell your care team if your symptoms do not start to get better or if they get worse. This medication may increase your risk of getting an infection. Call your care team for advice if you get a fever, chills, sore throat, or other symptoms of a cold or flu. Do not treat yourself. Try to avoid being around people who are sick. In some patients, this medication may cause a serious brain infection that may cause death. If you have any problems seeing, thinking, speaking, walking, or standing, tell your care team right away. If you cannot reach your care team, urgently seek other source of medical care. What side effects may I notice from receiving this medication? Side effects that you should report to your care team as soon as possible: Allergic reactions--skin rash, itching, hives, swelling of the face, lips, tongue, or throat Dizziness, loss of balance or coordination, confusion or trouble speaking Eye pain, change in vision, vision loss Fever, neck pain or stiffness, sensitivity to light, headache, nausea, vomiting, confusion Infection--fever, chills, cough, sore throat, wounds that don't heal, pain or trouble when passing urine, general feeling of discomfort or being unwell Liver injury--right upper belly pain, loss of appetite, nausea, light-colored stool, dark yellow or  brown urine, yellowing skin or eyes, unusual weakness or fatigue Unusual bruising or bleeding Side effects that usually do not require medical attention (report to your care team if they continue or are bothersome): Diarrhea Fatigue Headache Joint pain Nausea Vomiting Worsening mood, feelings of depression This list may not describe all possible side effects. Call your doctor for medical advice about side effects. You may report side effects to FDA at 1-800-FDA-1088. Where should I keep my medication? This medication is given in a hospital or clinic. It will not be stored at home. NOTE: This sheet is a summary. It may not cover all possible information. If you have questions about this medicine, talk to your doctor, pharmacist, or health care provider.  2024 Elsevier/Gold Standard (2021-03-02 00:00:00)

## 2022-08-31 NOTE — Progress Notes (Unsigned)
NEW PATIENT Date of Service/Encounter:  09/03/22 Referring provider: Anabel Halon, MD Primary care provider: Anabel Halon, MD  Subjective:  Allison Valenzuela is a 42 y.o. female with a PMHx of vitamin D deficiency, history of DVT on Xarelto, B12 deficiency, obesity, migraines, history of gastric bypass, subclinical hypothyroidism, relapsing remitting multiple sclerosis presenting today for evaluation of chronic rhinitis and dermatitis. History obtained from: chart review and patient.   Chronic rhinitis:  Originally from [place], moved to [place] around 7 years ago. Symptoms include: nasal congestion, dry eyes  Occurs year-round Potential triggers: unknown Treatments tried: benadryl PRN, previously on zyrtec D-made her HTN Seen by ENT and told nothing was abnormal with her sinus structure. Previous allergy testing: no History of reflux/heartburn: yes-treats with tums and diet Previous sinus, ear, tonsil, adenoid surgeries: tonsillectomy at age 42 yo  Atopic Dermatitis:  Diagnosed at age 82 years old, flares mostly on face and scalp or leg. Current regimen: HCT  1% Reports use of fragrance/dye free products Antibiotics needed for skin infections: no Has done patch testing and was positive to nickel and blue dye Previously followed by dermatology, but has not been back in many years.   Other allergy screening: Asthma: no Food allergy:  no-had previous allergy testing and was told positive to kale, but she eats kale without symptoms. Medication allergy: no  Past Medical History: Past Medical History:  Diagnosis Date   Acid reflux    Anemia    Anxiety 09/15/21   Back pain    Bilateral bunions    Chest pain    Constipation    DVT (deep venous thrombosis) (HCC)    right leg   Dyspnea    Eczema    Encounter for general adult medical examination with abnormal findings 02/02/2022   Gestational diabetes    HTN (hypertension)    Lactose intolerance    Leg edema    Vitamin  D deficiency    Medication List:  Current Outpatient Medications  Medication Sig Dispense Refill   rivaroxaban (XARELTO) 20 MG TABS tablet Take 1 tablet by mouth daily.     Ubrogepant (UBRELVY) 100 MG TABS Take 1 tablet (100 mg total) by mouth as needed. May repeat after 2 hours.  Maximum 2 tablets in 24 hours. 16 tablet 11   Vitamin D, Ergocalciferol, (DRISDOL) 1.25 MG (50000 UNIT) CAPS capsule Take 1 capsule (50,000 Units total) by mouth every 7 (seven) days. 4 capsule 0   No current facility-administered medications for this visit.   Known Allergies:  Allergies  Allergen Reactions   Dye Fdc Blue 1 [Fd&C Blue #1 (Brilliant Blue)] Itching and Rash   Nickel Itching and Rash   Past Surgical History: Past Surgical History:  Procedure Laterality Date   LAPAROSCOPIC VAGINAL HYSTERECTOMY WITH SALPINGECTOMY Bilateral 05/06/2019   Procedure: LAPAROSCOPIC ASSISTED VAGINAL HYSTERECTOMY WITH SALPINGECTOMY;  Surgeon: Geryl Rankins, MD;  Location: [facility] [place];  Service: Gynecology;  Laterality: Bilateral;  SITE ALSO VAGINA   ROUX-EN-Y PROCEDURE     TONSILLECTOMY     TOTAL ABDOMINAL HYSTERECTOMY     04/22   Family History: Family History  Problem Relation Age of Onset   Hypertension Mother    Heart disease Mother    Thyroid disease Mother    Obesity Mother    Stroke Maternal Aunt    Stroke Maternal Uncle    Migraines Maternal Uncle    Social History: [name] lives in a house built 3 years ago, no water  damage, carpet in the bedroom, gas heating, indoor dogs, no roaches, not using dust mite protection the bedding or pillows, no smoke exposure.  [place]   ROS:  All other systems negative except as noted per HPI.  Objective:  Blood pressure 126/84, pulse 77, temperature 98.8 F (37.1 C), resp. rate 18, height 5' 5.75" (1.67 m), weight 203 lb 8 oz (92.3 kg), last menstrual period 04/08/2019, SpO2 97%. Body mass index is 33.1  kg/m. Physical Exam:  General Appearance:  Alert, cooperative, no distress, appears stated age  Head:  Normocephalic, without obvious abnormality, atraumatic  Eyes:  Conjunctiva clear, EOM's intact  Ears EACs normal bilaterally and normal TMs bilaterally  Nose: Nares normal, hypertrophic turbinates, normal mucosa, and no visible anterior polyps  Throat: Lips, tongue normal; teeth and gums normal, normal posterior oropharynx  Neck: Supple, symmetrical  Lungs:   clear to auscultation bilaterally, Respirations unlabored, no coughing  Heart:  regular rate and rhythm and no murmur, Appears well perfused  Extremities: No edema  Skin: Dry well-defined patches on neck and dry areas on scalp with overlying silvery scale, small area behind left ear  Neurologic: No gross deficits   Diagnostics: Skin Testing: Environmental allergy panel. Adequate positive and negative controls. Results discussed with patient/family.  Airborne Adult Perc - 09/03/22 0844     Time Antigen Placed 0845    Allergen Manufacturer Waynette Buttery    Location Back    Number of Test 55    Panel 1 Select    1. Control-Buffer 50% Glycerol Negative    2. Control-Histamine 3+    3. Bahia Negative    4. French Southern Territories Negative    5. Johnson Negative    6. [place] Blue Negative    7. Meadow Fescue Negative    8. Perennial Rye Negative    9. Timothy Negative    10. Ragweed Mix Negative    11. Cocklebur Negative    12. Plantain,  English Negative    13. Baccharis Negative    14. Dog Fennel Negative    15. Russian Thistle Negative    16. Lamb's Quarters Negative    17. Sheep Sorrell Negative    18. Rough Pigweed Negative    19. Marsh Elder, Rough Negative    20. Mugwort, Common Negative    21. Box, Elder Negative    22. Cedar, red Negative    23. Sweet Gum Negative    24. Pecan Pollen Negative    25. Pine Mix Negative    26. Walnut, Black Pollen Negative    27. Red Mulberry Negative    28. Ash Mix Negative    29. Birch Mix  Negative    30. Beech American Negative    31. Cottonwood, Guinea-Bissau Negative    32. Hickory, White Negative    33. Maple Mix Negative    34. Oak, [place] Mix Negative    35. Sycamore Eastern Negative    36. Alternaria Alternata Negative    37. Cladosporium Herbarum Negative    38. Aspergillus Mix Negative    39. Penicillium Mix Negative    40. Bipolaris Sorokiniana (Helminthosporium) Negative    41. Drechslera Spicifera (Curvularia) Negative    42. Mucor Plumbeus Negative    43. Fusarium Moniliforme Negative    44. Aureobasidium Pullulans (pullulara) Negative    45. Rhizopus Oryzae Negative    46. Botrytis Cinera Negative    47. Epicoccum Nigrum Negative    48. Phoma Betae Negative  49. Dust Mite Mix Negative    50. Cat Hair 10,000 BAU/ml Negative    51.  Dog Epithelia Negative    52. Mixed Feathers Negative    53. Horse Epithelia Negative    54. Cockroach, German Negative    55. Tobacco Leaf Negative             Intradermal - 09/03/22 1000     Time Antigen Placed 1001    Allergen Manufacturer Waynette Buttery    Location Arm    Number of Test 16    Intradermal Select    Control Negative    Bahia Negative    French Southern Territories Negative    Johnson Negative    7 Grass Negative    Ragweed Mix Negative    Weed Mix Negative    Tree Mix Negative    Mold 1 Negative    Mold 2 Negative    Mold 3 Negative    Mold 4 Negative    Mite Mix Negative    Cat Negative    Dog Negative    Cockroach Negative             Allergy testing results were read and interpreted by myself, documented by clinical staff.  Labs:  Lab Orders  No laboratory test(s) ordered today     Assessment and Plan  Dermatitis: suspect psoriasis Referral to Dermatology Daily Care For Maintenance (daily and continue even once controlled) - Use hypoallergenic hydrating ointment at least twice daily.  This must be done daily for control of flares. (Great options include Vaseline, CeraVe, Aquaphor, Aveeno,  Cetaphil, VaniCream, etc) - Avoid detergents, soaps or lotions with fragrances/dyes - Limit showers/baths to 5 minutes and use luke warm water instead of hot, pat dry following baths, and apply moisturizer - can use steroid/non-steroid therapy creams as detailed below up to twice weekly for prevention of flares.  For Flares:(add this to maintenance therapy if needed for flares) First apply steroid/non-steroid treatment creams. Wait 5 minutes then apply moisturizer.  - Triamcinolone 0.1% to body for moderate flares-apply topically twice daily to red, raised areas of skin, followed by moisturizer. Do NOT use on face, groin or armpits. - Hydrocortisone 2.5% to face/body-apply topically twice daily to red, raised areas of skin, followed by moisturizer For scalp can use fluoicinolone oil twice daily as needed  Chronic Rhinitis -nonallergic: - allergy testing today was negative on skin tesitng: intradermal testing also negative. - Consider nasal saline rinses as needed to help remove pollens, mucus and hydrate nasal mucosa - consider Flonase (fluticasone) 1 spray in each nostril daily  Best results if used daily.  Discontinue if recurrent nose bleeds.  Follow up : as needed It was a pleasure meeting you in clinic today! Thank you for allowing me to participate in your care.  This note in its entirety was forwarded to the Provider who requested this consultation.  Other: none  Thank you for your kind referral. I appreciate the opportunity to take part in Allison Valenzuela's care. Please do not hesitate to contact me with questions.  Sincerely,  Tonny Bollman, MD [facility]

## 2022-09-03 ENCOUNTER — Ambulatory Visit (INDEPENDENT_AMBULATORY_CARE_PROVIDER_SITE_OTHER): Payer: No Typology Code available for payment source | Admitting: Internal Medicine

## 2022-09-03 ENCOUNTER — Encounter: Payer: Self-pay | Admitting: Internal Medicine

## 2022-09-03 ENCOUNTER — Other Ambulatory Visit: Payer: Self-pay

## 2022-09-03 ENCOUNTER — Other Ambulatory Visit: Payer: Self-pay | Admitting: Internal Medicine

## 2022-09-03 VITALS — BP 126/84 | HR 77 | Temp 98.8°F | Resp 18 | Ht 65.75 in | Wt 203.5 lb

## 2022-09-03 DIAGNOSIS — J310 Chronic rhinitis: Secondary | ICD-10-CM | POA: Diagnosis not present

## 2022-09-03 DIAGNOSIS — L409 Psoriasis, unspecified: Secondary | ICD-10-CM | POA: Diagnosis not present

## 2022-09-03 MED ORDER — FLUOCINOLONE ACETONIDE 0.01 % EX SOLN: Freq: Two times a day (BID) | CUTANEOUS | 1 refills | Status: DC | PRN

## 2022-09-03 MED ORDER — TRIAMCINOLONE ACETONIDE 0.1 % EX OINT
TOPICAL_OINTMENT | CUTANEOUS | 1 refills | Status: DC
Start: 2022-09-03 — End: 2022-09-03

## 2022-09-03 MED ORDER — HYDROCORTISONE 2.5 % EX OINT: TOPICAL_OINTMENT | CUTANEOUS | 2 refills | Status: AC

## 2022-09-03 NOTE — Patient Instructions (Addendum)
Dermatitis: suspect psoriasis Referral to Dermatology Daily Care For Maintenance (daily and continue even once controlled) - Use hypoallergenic hydrating ointment at least twice daily.  This must be done daily for control of flares. (Great options include Vaseline, CeraVe, Aquaphor, Aveeno, Cetaphil, VaniCream, etc) - Avoid detergents, soaps or lotions with fragrances/dyes - Limit showers/baths to 5 minutes and use luke warm water instead of hot, pat dry following baths, and apply moisturizer - can use steroid/non-steroid therapy creams as detailed below up to twice weekly for prevention of flares.  For Flares:(add this to maintenance therapy if needed for flares) First apply steroid/non-steroid treatment creams. Wait 5 minutes then apply moisturizer.  - Triamcinolone 0.1% to body for moderate flares-apply topically twice daily to red, raised areas of skin, followed by moisturizer. Do NOT use on face, groin or armpits. - Hydrocortisone 2.5% to face/body-apply topically twice daily to red, raised areas of skin, followed by moisturizer For scalp can use fluoicinolone oil twice daily as needed  Chronic Rhinitis -nonallergic: - allergy testing today was negative on skin tesitng: intradermal testing also negative. - Consider nasal saline rinses as needed to help remove pollens, mucus and hydrate nasal mucosa -  consider  Flonase (fluticasone) 1 spray in each nostril daily  Best results if used daily.  Discontinue if recurrent nose bleeds.  Follow up : as needed It was a pleasure meeting you in clinic today! Thank you for allowing me to participate in your care.  Tonny Bollman, MD [facility]

## 2022-09-05 ENCOUNTER — Telehealth: Payer: Self-pay

## 2022-09-05 DIAGNOSIS — G35 Multiple sclerosis: Secondary | ICD-10-CM

## 2022-09-05 NOTE — Telephone Encounter (Signed)
Per patient she was advised by her manager she will need this on file for any office visit needed to be taken, any days that may need to be taken off.

## 2022-09-05 NOTE — Telephone Encounter (Signed)
FMLA Paperwork received 09/04/22. Patient new 05/2022.    Paperwork on Provider next.

## 2022-09-05 NOTE — Telephone Encounter (Signed)
-----   Message from Cira Servant sent at 09/05/2022  7:33 AM EDT ----- Another hepatitis test is needed - We need to check HBs Ag.  While we're going to check another lab, I would also like to check CD19 count as well.

## 2022-09-06 ENCOUNTER — Other Ambulatory Visit: Payer: Self-pay | Admitting: Neurology

## 2022-09-07 ENCOUNTER — Other Ambulatory Visit (INDEPENDENT_AMBULATORY_CARE_PROVIDER_SITE_OTHER): Payer: No Typology Code available for payment source

## 2022-09-07 DIAGNOSIS — G35 Multiple sclerosis: Secondary | ICD-10-CM

## 2022-09-07 DIAGNOSIS — Z0279 Encounter for issue of other medical certificate: Secondary | ICD-10-CM

## 2022-09-07 LAB — STRATIFY JCV AB (W/ INDEX) W/ RFLX
Index Value: 2.36 — ABNORMAL HIGH
Stratify JCV (TM) Ab w/Reflex Inhibition: POSITIVE — AB

## 2022-09-07 LAB — QUANTIFERON-TB GOLD PLUS
Mitogen-NIL: >10 [IU]/mL
NIL: 0.07 [IU]/mL
QuantiFERON-TB Gold Plus: NEGATIVE
TB1-NIL: <0 [IU]/mL
TB2-NIL: <0 [IU]/mL

## 2022-09-07 LAB — IGG, IGA, IGM
IgG (Immunoglobin G), Serum: 1531 mg/dL (ref 600–1640)
IgM, Serum: 87 mg/dL (ref 50–300)
Immunoglobulin A: 223 mg/dL (ref 47–310)

## 2022-09-07 LAB — HEPATITIS B CORE ANTIBODY, IGM: Hep B C IgM: NONREACTIVE

## 2022-09-07 LAB — ANGIOTENSIN CONVERTING ENZYME: Angiotensin-Converting Enzyme: 36 U/L (ref 9–67)

## 2022-09-07 NOTE — Telephone Encounter (Signed)
Patient stopped by to pick up copy.

## 2022-09-08 LAB — IGG, IGA, IGM
IgG (Immunoglobin G), Serum: 1536 mg/dL (ref 600–1640)
IgM, Serum: 79 mg/dL (ref 50–300)
Immunoglobulin A: 214 mg/dL (ref 47–310)

## 2022-09-10 ENCOUNTER — Telehealth: Payer: Self-pay

## 2022-09-10 LAB — COMPREHENSIVE METABOLIC PANEL
AG Ratio: 1.5 (calc) (ref 1.0–2.5)
ALT: 11 U/L (ref 6–29)
AST: 17 U/L (ref 10–30)
Albumin: 4.1 g/dL (ref 3.6–5.1)
Alkaline phosphatase (APISO): 44 U/L (ref 31–125)
BUN: 12 mg/dL (ref 7–25)
CO2: 26 mmol/L (ref 20–32)
Calcium: 9.2 mg/dL (ref 8.6–10.2)
Chloride: 104 mmol/L (ref 98–110)
Creat: 0.97 mg/dL (ref 0.50–0.99)
Globulin: 2.8 g/dL (ref 1.9–3.7)
Glucose, Bld: 93 mg/dL (ref 65–99)
Potassium: 3.8 mmol/L (ref 3.5–5.3)
Sodium: 138 mmol/L (ref 135–146)
Total Bilirubin: 1.2 mg/dL (ref 0.2–1.2)
Total Protein: 6.9 g/dL (ref 6.1–8.1)

## 2022-09-10 LAB — CBC WITH DIFFERENTIAL/PLATELET
Absolute Monocytes: 179 {cells}/uL — ABNORMAL LOW (ref 200–950)
Basophils Absolute: 21 {cells}/uL (ref 0–200)
Basophils Relative: 0.9 %
Eosinophils Absolute: 69 {cells}/uL (ref 15–500)
Eosinophils Relative: 3 %
HCT: 37.3 % (ref 35.0–45.0)
Hemoglobin: 12.4 g/dL (ref 11.7–15.5)
Lymphs Abs: 1194 {cells}/uL (ref 850–3900)
MCH: 31.6 pg (ref 27.0–33.0)
MCHC: 33.2 g/dL (ref 32.0–36.0)
MCV: 95.2 fL (ref 80.0–100.0)
MPV: 9.7 fL (ref 7.5–12.5)
Monocytes Relative: 7.8 %
Neutro Abs: 837 {cells}/uL — ABNORMAL LOW (ref 1500–7800)
Neutrophils Relative %: 36.4 %
Platelets: 232 10*3/uL (ref 140–400)
RBC: 3.92 10*6/uL (ref 3.80–5.10)
RDW: 11.9 % (ref 11.0–15.0)
Total Lymphocyte: 51.9 %
WBC: 2.3 10*3/uL — ABNORMAL LOW (ref 3.8–10.8)

## 2022-09-10 LAB — ACUTE HEP PANEL AND HEP B SURFACE AB
HEPATITIS C ANTIBODY REFILL$(REFL): NONREACTIVE
Hep A IgM: NONREACTIVE
Hep B C IgM: NONREACTIVE
Hepatitis B Surface Ag: NONREACTIVE

## 2022-09-10 LAB — REFLEX TIQ

## 2022-09-10 LAB — VITAMIN D 25 HYDROXY (VIT D DEFICIENCY, FRACTURES): Vit D, 25-Hydroxy: 60 ng/mL (ref 30–100)

## 2022-09-10 NOTE — Telephone Encounter (Signed)
-----   Message from Verlee Monte sent at 09/03/2022 10:56 AM EDT ----- Can we refer to Derm for psoriasis? Thanks!

## 2022-09-10 NOTE — Telephone Encounter (Signed)
Referral has been placed. Patient updated via MyChart.   [facility] [address] [place],  [place]  [address] Main: [phone]

## 2022-09-14 ENCOUNTER — Telehealth: Payer: Self-pay

## 2022-09-14 DIAGNOSIS — G35 Multiple sclerosis: Secondary | ICD-10-CM | POA: Insufficient documentation

## 2022-09-16 ENCOUNTER — Encounter: Payer: Self-pay | Admitting: Neurology

## 2022-09-25 ENCOUNTER — Encounter: Payer: Self-pay | Admitting: Family Medicine

## 2022-09-25 ENCOUNTER — Telehealth (INDEPENDENT_AMBULATORY_CARE_PROVIDER_SITE_OTHER): Payer: No Typology Code available for payment source | Admitting: Family Medicine

## 2022-09-25 ENCOUNTER — Encounter: Payer: Self-pay | Admitting: Internal Medicine

## 2022-09-25 DIAGNOSIS — U071 COVID-19: Secondary | ICD-10-CM

## 2022-09-25 MED ORDER — BENZONATATE 100 MG PO CAPS
100.0000 mg | ORAL_CAPSULE | Freq: Three times a day (TID) | ORAL | 0 refills | Status: AC
Start: 2022-09-25 — End: 2022-10-07

## 2022-09-25 MED ORDER — PREDNISONE 20 MG PO TABS
40.0000 mg | ORAL_TABLET | Freq: Every day | ORAL | 0 refills | Status: AC
Start: 2022-09-25 — End: 2022-09-30

## 2022-09-25 NOTE — Telephone Encounter (Signed)
Appt made/ pt aware  

## 2022-09-25 NOTE — Progress Notes (Signed)
Virtual Visit via Video Note  I connected with Allison Valenzuela on 09/25/22 at  3:00 PM EDT by a video enabled telemedicine application and verified that I am speaking with the correct person using two identifiers.  Patient Location: Home Provider Location: Office/Clinic  I discussed the limitations, risks, security, and privacy concerns of performing an evaluation and management service by video and the availability of in person appointments. I also discussed with the patient that there may be a patient responsible charge related to this service. The patient expressed understanding and agreed to proceed.  Subjective: PCP: Anabel Halon, MD  Chief Complaint  Patient presents with   Covid Positive    Tested positive for covid   HPI The patient reports testing positive for COVID-19 on 09/19/2022. She is currently experiencing symptoms of cough, headaches, and sore throat. She denies having fever, difficulty breathing, fatigue, loss of smell or taste. The patient states she has been using her inhaler, a humidifier at bedtime, and NyQuil for symptom management.    ROS: Per HPI  Current Outpatient Medications:    benzonatate (TESSALON PERLES) 100 MG capsule, Take 1 capsule (100 mg total) by mouth 3 (three) times daily for 12 days., Disp: 36 capsule, Rfl: 0   predniSONE (DELTASONE) 20 MG tablet, Take 2 tablets (40 mg total) by mouth daily with breakfast for 5 days., Disp: 10 tablet, Rfl: 0   fluocinolone (SYNALAR) 0.01 % external solution, Apply topically 2 (two) times daily as needed., Disp: 60 mL, Rfl: 1   hydrocortisone 2.5 % ointment, Apply topically twice daily as need to red sandpapery rash., Disp: 30 g, Rfl: 2   hydrocortisone 2.5 % ointment, Apply topically 2 (two) times daily., Disp: 30 g, Rfl: 0   rivaroxaban (XARELTO) 20 MG TABS tablet, Take 1 tablet by mouth daily., Disp: , Rfl:    Ubrogepant (UBRELVY) 100 MG TABS, Take 1 tablet (100 mg total) by mouth as needed. May repeat  after 2 hours.  Maximum 2 tablets in 24 hours., Disp: 16 tablet, Rfl: 11   Vitamin D, Ergocalciferol, (DRISDOL) 1.25 MG (50000 UNIT) CAPS capsule, Take 1 capsule (50,000 Units total) by mouth every 7 (seven) days., Disp: 4 capsule, Rfl: 0  Observations/Objective: There were no vitals filed for this visit. Physical Exam Patient is well-developed, well-nourished in no acute distress.  Resting comfortably at home.  Head is normocephalic, atraumatic.  No labored breathing.  Speech is clear and coherent with logical content.  Patient is alert and oriented at baseline.   Assessment and Plan: Positive self-administered antigen test for COVID-19 -     predniSONE; Take 2 tablets (40 mg total) by mouth daily with breakfast for 5 days.  Dispense: 10 tablet; Refill: 0 -     Benzonatate; Take 1 capsule (100 mg total) by mouth 3 (three) times daily for 12 days.  Dispense: 36 capsule; Refill: 0  Take medication as prescribed. Increase fluids and allow for plenty of rest. Recommend Tylenol  as needed for pain, fever, or general discomfort. Warm salt water gargles 3-4 times daily to help with throat pain or discomfort. Recommend using a humidifier at bedtime during sleep to help with cough and nasal congestion.   Follow Up Instructions: Follow-up if your symptoms do not improve     I discussed the assessment and treatment plan with the patient. The patient was provided an opportunity to ask questions, and all were answered. The patient agreed with the plan and demonstrated an understanding of  the instructions.   The patient was advised to call back or seek an in-person evaluation if the symptoms worsen or if the condition fails to improve as anticipated.  The above assessment and management plan was discussed with the patient. The patient verbalized understanding of and has agreed to the management plan.   Gilmore Laroche, FNP

## 2022-09-28 ENCOUNTER — Telehealth: Payer: Self-pay | Admitting: Neurology

## 2022-09-28 NOTE — Telephone Encounter (Signed)
Pt called in stating her infusion medication was approved in early September, but she still has not heard from anyone about getting the infusion. She would like to see if she can call someone to schedule it?

## 2022-10-01 NOTE — Telephone Encounter (Signed)
Allison Valenzuela, Patient will be scheduled as soon as possible. Scheduling team has been notified to reach out to patient.  Auth Submission: APPROVED Site of care: Site of care: [facility] Payer: [employer] Medication & CPT/J Code(s) submitted: Emogene Morgan [facility] [employer]) [phone] Route of submission (phone, fax, portal): PORTAL Phone #[phone] Fax # Auth type: Buy/Bill PB Units/visits requested: x2 doses Reference number: [id number] Approval from: 09/14/22 to 09/14/23   Co-pay card : APPROVED ID: [id number] GR: [id number] BIN: [id number] PCN: [id number] PAYER ID: [id number]  Phone: [phone]

## 2022-10-01 NOTE — Telephone Encounter (Signed)
Patient scheduled.

## 2022-10-05 ENCOUNTER — Ambulatory Visit (INDEPENDENT_AMBULATORY_CARE_PROVIDER_SITE_OTHER): Payer: No Typology Code available for payment source

## 2022-10-05 VITALS — BP 119/81 | HR 74 | Temp 98.4°F | Resp 18 | Ht 66.0 in | Wt 207.0 lb

## 2022-10-05 DIAGNOSIS — G35 Multiple sclerosis: Secondary | ICD-10-CM

## 2022-10-05 MED ORDER — SODIUM CHLORIDE 0.9 % IV SOLN
300.0000 mg | Freq: Once | INTRAVENOUS | Status: AC
  Administered 2022-10-05: 300 mg via INTRAVENOUS
  Filled 2022-10-05: qty 10

## 2022-10-05 MED ORDER — DIPHENHYDRAMINE HCL 25 MG PO CAPS
50.0000 mg | ORAL_CAPSULE | Freq: Once | ORAL | Status: AC
  Administered 2022-10-05: 50 mg via ORAL
  Filled 2022-10-05: qty 2

## 2022-10-05 MED ORDER — ACETAMINOPHEN 325 MG PO TABS
650.0000 mg | ORAL_TABLET | Freq: Once | ORAL | Status: AC
  Administered 2022-10-05: 650 mg via ORAL
  Filled 2022-10-05: qty 2

## 2022-10-05 MED ORDER — METHYLPREDNISOLONE SODIUM SUCC 125 MG IJ SOLR
125.0000 mg | Freq: Once | INTRAMUSCULAR | Status: AC
  Administered 2022-10-05: 125 mg via INTRAVENOUS
  Filled 2022-10-05: qty 2

## 2022-10-05 NOTE — Progress Notes (Signed)
Diagnosis: Multiple Sclerosis  Provider:  Chilton Greathouse MD  Procedure: IV Infusion  IV Type: Peripheral, IV Location: L Antecubital  Ocrevus (Ocrelizumab), Dose: 300 mg  Infusion Start Time: 1100  Infusion Stop Time: 1358  Post Infusion IV Care: Observation period completed and Peripheral IV Discontinued  Discharge: Condition: Good, Destination: Home . AVS Declined  Performed by:  Wyvonne Lenz, RN

## 2022-10-10 ENCOUNTER — Encounter: Payer: Self-pay | Admitting: Neurology

## 2022-10-17 ENCOUNTER — Ambulatory Visit (HOSPITAL_COMMUNITY): Payer: No Typology Code available for payment source

## 2022-10-22 ENCOUNTER — Ambulatory Visit (INDEPENDENT_AMBULATORY_CARE_PROVIDER_SITE_OTHER): Payer: No Typology Code available for payment source

## 2022-10-22 VITALS — BP 118/79 | HR 85 | Temp 98.7°F | Resp 16 | Ht 66.0 in | Wt 207.0 lb

## 2022-10-22 DIAGNOSIS — G35 Multiple sclerosis: Secondary | ICD-10-CM | POA: Diagnosis not present

## 2022-10-22 MED ORDER — ACETAMINOPHEN 325 MG PO TABS
650.0000 mg | ORAL_TABLET | Freq: Once | ORAL | Status: AC
  Administered 2022-10-22: 650 mg via ORAL
  Filled 2022-10-22: qty 2

## 2022-10-22 MED ORDER — SODIUM CHLORIDE 0.9 % IV SOLN
300.0000 mg | Freq: Once | INTRAVENOUS | Status: AC
  Administered 2022-10-22: 300 mg via INTRAVENOUS
  Filled 2022-10-22: qty 10

## 2022-10-22 MED ORDER — DIPHENHYDRAMINE HCL 25 MG PO CAPS
50.0000 mg | ORAL_CAPSULE | Freq: Once | ORAL | Status: AC
  Administered 2022-10-22: 50 mg via ORAL
  Filled 2022-10-22: qty 2

## 2022-10-22 MED ORDER — METHYLPREDNISOLONE SODIUM SUCC 125 MG IJ SOLR
125.0000 mg | Freq: Once | INTRAMUSCULAR | Status: AC
  Administered 2022-10-22: 125 mg via INTRAVENOUS
  Filled 2022-10-22: qty 2

## 2022-10-22 NOTE — Progress Notes (Signed)
Diagnosis: Multiple Sclerosis  Provider:  Chilton Greathouse MD  Procedure: IV Infusion  IV Type: Peripheral, IV Location: L Antecubital  Ocrevus (Ocrelizumab), Dose: 300 mg  Infusion Start Time: 1011  Infusion Stop Time: 1306  Post Infusion IV Care: Peripheral IV Discontinued  Discharge: Condition: Good, Destination: Home . AVS Declined  Performed by:  Rico Ala, LPN

## 2022-11-09 ENCOUNTER — Other Ambulatory Visit: Payer: Self-pay | Admitting: Internal Medicine

## 2022-11-09 DIAGNOSIS — Z1231 Encounter for screening mammogram for malignant neoplasm of breast: Secondary | ICD-10-CM

## 2022-11-21 ENCOUNTER — Other Ambulatory Visit: Payer: Self-pay | Admitting: Internal Medicine

## 2022-12-10 ENCOUNTER — Telehealth: Payer: Self-pay

## 2022-12-10 ENCOUNTER — Ambulatory Visit (INDEPENDENT_AMBULATORY_CARE_PROVIDER_SITE_OTHER): Payer: No Typology Code available for payment source | Admitting: Internal Medicine

## 2022-12-10 ENCOUNTER — Encounter: Payer: Self-pay | Admitting: Internal Medicine

## 2022-12-10 VITALS — BP 126/86 | HR 70 | Ht 66.0 in | Wt 222.0 lb

## 2022-12-10 DIAGNOSIS — E559 Vitamin D deficiency, unspecified: Secondary | ICD-10-CM | POA: Diagnosis not present

## 2022-12-10 DIAGNOSIS — E038 Other specified hypothyroidism: Secondary | ICD-10-CM

## 2022-12-10 DIAGNOSIS — D72819 Decreased white blood cell count, unspecified: Secondary | ICD-10-CM | POA: Insufficient documentation

## 2022-12-10 DIAGNOSIS — G35 Multiple sclerosis: Secondary | ICD-10-CM

## 2022-12-10 DIAGNOSIS — G43019 Migraine without aura, intractable, without status migrainosus: Secondary | ICD-10-CM

## 2022-12-10 NOTE — Assessment & Plan Note (Signed)
Followed by neurology Has Bernita Raisin as needed for migraine

## 2022-12-10 NOTE — Assessment & Plan Note (Signed)
Has recent lowering of WBC, could be due to recently started Ocrevus Recheck CBC, followed by Hematology

## 2022-12-10 NOTE — Assessment & Plan Note (Signed)
Check vitamin D level She has been taking Vitamin D 30,000 IU once daily - can be hazardous, advised to take Vit D 10,000 IU once daily or once every other day

## 2022-12-10 NOTE — Assessment & Plan Note (Signed)
On Ocrevus Followed by Neurology

## 2022-12-10 NOTE — Patient Instructions (Signed)
Please take only 1 tablet of Vitamin D once daily.  Please continue to take medications as prescribed.  Please continue to follow low carb diet and perform moderate exercise/walking at least 150 mins/week.  Please get blood tests done before the next visit.

## 2022-12-10 NOTE — Assessment & Plan Note (Signed)
Recheck TSH and free T4 with thyroid peroxidase antibody

## 2022-12-10 NOTE — Progress Notes (Signed)
Established Patient Office Visit  Subjective:  Patient ID: Allison Valenzuela, female    DOB: 1980-09-06  Age: 42 y.o. MRN: [id number]  CC:  Chief Complaint  Patient presents with   bloodwork    Requesting blood work     HPI Allison Valenzuela is a 42 y.o. female with past medical history of MS, unprovoked DVT and obesity s/p gastric bypass surgery who presents for f/u of her chronic medical conditions.  She has recently started Ocrevus for multiple sclerosis.  She had leukopenia recently, but has longstanding history of borderline low WBC count.  Denies any recent URTI or other infections.  She is currently taking vitamin D 10,000 IU X 3 tablets once daily as she thought she needs high dose to supplement her Vitamin D. She used to take vitamin D 50 50,000 IU QW in the past, but states that she still has vitamin D deficiency (?). Her last Vit D level was 60 in 08/24.  She currently denies any nausea, vomiting, abdominal pain, or change in bowel habits.     Past Medical History:  Diagnosis Date   Acid reflux    Anemia    Anxiety 09/15/21   Back pain    Bilateral bunions    Chest pain    Constipation    DVT (deep venous thrombosis) (HCC)    right leg   Dyspnea    Eczema    Encounter for general adult medical examination with abnormal findings 02/02/2022   Gestational diabetes    HTN (hypertension)    Lactose intolerance    Leg edema    Vitamin D deficiency     Past Surgical History:  Procedure Laterality Date   LAPAROSCOPIC VAGINAL HYSTERECTOMY WITH SALPINGECTOMY Bilateral 05/06/2019   Procedure: LAPAROSCOPIC ASSISTED VAGINAL HYSTERECTOMY WITH SALPINGECTOMY;  Surgeon: Geryl Rankins, MD;  Location: [facility];  Service: Gynecology;  Laterality: Bilateral;  SITE ALSO VAGINA   ROUX-EN-Y PROCEDURE     TONSILLECTOMY     TOTAL ABDOMINAL HYSTERECTOMY     04/22    Family History  Problem Relation Age of Onset   Hypertension Mother    Heart disease  Mother    Thyroid disease Mother    Obesity Mother    Stroke Maternal Aunt    Stroke Maternal Uncle    Migraines Maternal Uncle     Social History   Socioeconomic History   Marital status: Married    Spouse name: Tyshara Cazarez   Number of children: 3   Years of education: Not on file   Highest education level: Associate degree: academic program  Occupational History   Occupation: Tree surgeon  Tobacco Use   Smoking status: Never   Smokeless tobacco: Never  Vaping Use   Vaping status: Never Used  Substance and Sexual Activity   Alcohol use: Not Currently    Comment: Social drinker   Drug use: Never   Sexual activity: Yes    Birth control/protection: None  Other Topics Concern   Not on file  Social History Narrative   Not on file   Social Determinants of Health   Financial Resource Strain: Low Risk  (08/06/2022)   Overall Financial Resource Strain (CARDIA)    Difficulty of Paying Living Expenses: Not hard at all  Food Insecurity: No Food Insecurity (08/06/2022)   Hunger Vital Sign    Worried About Running Out of Food in the Last Year: Never true    Ran Out of Food in the Last  Year: Never true  Transportation Needs: No Transportation Needs (08/06/2022)   PRAPARE - Administrator, Civil Service (Medical): No    Lack of Transportation (Non-Medical): No  Physical Activity: Sufficiently Active (08/06/2022)   Exercise Vital Sign    Days of Exercise per Week: 5 days    Minutes of Exercise per Session: 30 min  Stress: No Stress Concern Present (08/06/2022)   [employer] of Occupational Health - Occupational Stress Questionnaire    Feeling of Stress : Not at all  Social Connections: Moderately Isolated (08/06/2022)   Social Connection and Isolation Panel [NHANES]    Frequency of Communication with Friends and Family: Three times a week    Frequency of Social Gatherings with Friends and Family: Once a week    Attends Religious Services: Never    Automotive engineer or Organizations: No    Attends Engineer, structural: Not on file    Marital Status: Married  Catering manager Violence: Not on file    Outpatient Medications Prior to Visit  Medication Sig Dispense Refill   topiramate (TOPAMAX) 25 MG tablet Take by mouth.     fluocinolone (SYNALAR) 0.01 % external solution Apply topically 2 (two) times daily as needed. 60 mL 1   hydrocortisone 2.5 % ointment Apply topically twice daily as need to red sandpapery rash. 30 g 2   hydrocortisone 2.5 % ointment APPLY TOPICALLY TWICE A DAY 453.6 g 1   rivaroxaban (XARELTO) 20 MG TABS tablet Take 1 tablet by mouth daily.     Ubrogepant (UBRELVY) 100 MG TABS Take 1 tablet (100 mg total) by mouth as needed. May repeat after 2 hours.  Maximum 2 tablets in 24 hours. 16 tablet 11   Vitamin D, Ergocalciferol, (DRISDOL) 1.25 MG (50000 UNIT) CAPS capsule Take 1 capsule (50,000 Units total) by mouth every 7 (seven) days. 4 capsule 0   No facility-administered medications prior to visit.    Allergies  Allergen Reactions   Dye Fdc Blue 1 [Fd&C Blue #1 (Brilliant Blue)] Itching and Rash   Nickel Itching and Rash    ROS Review of Systems  Constitutional:  Negative for chills and fever.  HENT:  Negative for congestion and sore throat.   Eyes:  Negative for pain and discharge.  Respiratory:  Negative for cough and shortness of breath.   Cardiovascular:  Negative for chest pain and palpitations.  Gastrointestinal:  Negative for abdominal pain, diarrhea, nausea and vomiting.  Endocrine: Negative for polydipsia and polyuria.  Genitourinary:  Negative for dysuria and hematuria.  Musculoskeletal:  Negative for neck pain and neck stiffness.  Skin:  Negative for rash.  Neurological:  Positive for weakness and numbness (B/l UE and LE).  Psychiatric/Behavioral:  Positive for sleep disturbance. Negative for agitation and behavioral problems.       Objective:    Physical Exam Vitals reviewed.   Constitutional:      General: She is not in acute distress.    Appearance: She is obese. She is not diaphoretic.  HENT:     Head: Normocephalic and atraumatic.     Nose: Nose normal.     Mouth/Throat:     Mouth: Mucous membranes are moist.  Eyes:     General: No scleral icterus.    Extraocular Movements: Extraocular movements intact.  Cardiovascular:     Rate and Rhythm: Normal rate and regular rhythm.     Heart sounds: Normal heart sounds. No murmur heard. Pulmonary:  Breath sounds: Normal breath sounds. No wheezing or rales.  Musculoskeletal:     Cervical back: Neck supple. No tenderness.     Right lower leg: No edema.     Left lower leg: No edema.  Skin:    General: Skin is warm.     Findings: No rash.  Neurological:     General: No focal deficit present.     Mental Status: She is alert and oriented to person, place, and time.     Gait: Gait normal.  Psychiatric:        Mood and Affect: Mood normal.        Behavior: Behavior normal.     BP 126/86 (BP Location: Right Arm, Patient Position: Sitting, Cuff Size: Large)   Pulse 70   Ht 5\' 6"  (1.676 m)   Wt 222 lb (100.7 kg)   LMP 04/08/2019   SpO2 96%   BMI 35.83 kg/m  Wt Readings from Last 3 Encounters:  12/10/22 222 lb (100.7 kg)  10/22/22 207 lb (93.9 kg)  10/05/22 207 lb (93.9 kg)    Lab Results  Component Value Date   TSH 0.850 02/02/2022   Lab Results  Component Value Date   WBC 2.3 (L) 09/07/2022   HGB 12.4 09/07/2022   HCT 37.3 09/07/2022   MCV 95.2 09/07/2022   PLT 232 09/07/2022   Lab Results  Component Value Date   NA 138 09/07/2022   K 3.8 09/07/2022   CO2 26 09/07/2022   GLUCOSE 93 09/07/2022   BUN 12 09/07/2022   CREATININE 0.97 09/07/2022   BILITOT 1.2 09/07/2022   ALKPHOS 47 08/28/2022   AST 17 09/07/2022   ALT 11 09/07/2022   PROT 6.9 09/07/2022   ALBUMIN 4.3 08/28/2022   CALCIUM 9.2 09/07/2022   ANIONGAP 7 05/17/2019   EGFR 76 08/28/2022   Lab Results  Component Value  Date   CHOL 183 08/28/2022   Lab Results  Component Value Date   HDL 70 08/28/2022   Lab Results  Component Value Date   LDLCALC 103 (H) 08/28/2022   Lab Results  Component Value Date   TRIG 49 08/28/2022   Lab Results  Component Value Date   CHOLHDL 2.6 08/28/2022   Lab Results  Component Value Date   HGBA1C 4.8 07/27/2021      Assessment & Plan:   Problem List Items Addressed This Visit       Cardiovascular and Mediastinum   Migraine    Followed by neurology Has Ubrelvy as needed for migraine      Relevant Medications   topiramate (TOPAMAX) 25 MG tablet     Endocrine   Subclinical hypothyroidism    Recheck TSH and free T4 with thyroid peroxidase antibody      Relevant Orders   TSH + free T4   Thyroid peroxidase antibody     Nervous and Auditory   Multiple sclerosis (HCC) - Primary    On Ocrevus Followed by Neurology      Relevant Orders   CBC with Differential/Platelet   CMP14+EGFR     Other   Vitamin D deficiency    Check vitamin D level She has been taking Vitamin D 30,000 IU once daily - can be hazardous, advised to take Vit D 10,000 IU once daily or once every other day      Relevant Orders   Vitamin D (25 hydroxy)   Leukopenia    Has recent lowering of WBC, could be due to recently  started Ocrevus Recheck CBC, followed by Hematology       No orders of the defined types were placed in this encounter.   Follow-up: Return if symptoms worsen or fail to improve.    Anabel Halon, MD

## 2022-12-10 NOTE — Telephone Encounter (Signed)
Copied from CRM [id number]. Topic: General - Running Late >> Dec 10, 2022  2:11 PM Lovey Newcomer R wrote: Patient/patient representative is calling because they are running late for an appointment. Advised she has up to 2:30 pm before rescheduling.

## 2022-12-11 NOTE — Progress Notes (Unsigned)
NEUROLOGY FOLLOW UP OFFICE NOTE  Pamula Kleen [id number]  Assessment/Plan:   Multiple sclerosis, relapsing-remitting. Migraine without aura, without status migrainosus, not intractable Nocturnal leg cramps Leukopenia, unknown etiology but now on Ocrevus, followed by hematology who is also monitoring closely.      DMT:  Emogene Morgan Vit D 10,000 I U daily Check CMP, immunoglobulin panel, vit D and Mg  Recheck MRI of brain/cervical/thoracic with and without contrast in February  Follow up in 6 months.   Subjective:  NOLLIE LUJAN is a 42 year old female with HTN and history of right LE DVT on Xarelto who follows up to discuss MRI results.  MRI of brain and spinal cord personally reviewed.  UPDATE: DMTEmogene Morgan (started 9/20 & 10/22/2022) Other:  D 10,000 I U daily  08/2022 LABS:  Acute Hep Panel/Hep B surface Ab negative; Quantiferon-TB Gold Plus negative; JCV Ab positive with index 2.36; IgA 214, IgG 1536, IgM 79; vit D 60 12/07/2022:  CBC with WBC 2.12, HGB 12.3, HCT 35.3, PLT 219, ALC 1  Reports feeling more facial tingling, more on the left side now. Legs feel stronger too.  Noticed in PT.   Migraines are controlled.  Only one in last 6 months. Wakes up with slight bifrontal headache every morning but then resolves once she is up.  Denies excessive daytime sleepiness.   She gets a charley horse in the right leg a couple of times a night.    Current NSAIDS/analgesics:  none Current triptans:  sumatriptan 50mg  Current ergotamine:  none Current anti-emetic:  none Current muscle relaxants:  none Current Antihypertensive medications:  none Current Antidepressant medications:  none Current Anticonvulsant medications:  none Current anti-CGRP:  none Current Vitamins/Herbal/Supplements:  D Current Antihistamines/Decongestants:  none Other therapy:  none Birth control:  none Other medications:  Xarelto   HISTORY: In 2009, she developed right sided numbness and  tingling of face, torso, arm and leg after starting OCP.  She was advised to discontinue it and symptoms resolved.  She never had an MRI of the brain.  She had a recurrence of similar right sided numbness and tingling around February-March 2024, but this time intermittent lasting 10 minutes and occurring about twice a month.  No associated headache.  MRI of brain and spinal cord showed non-enhancing lesions in the brain and spinal cord.   She has history of migraines in childhood but she grew out of them.  They restarted in late 2023-early 2024.  They are a moderate right periorbital nonthrobbing pain sometimes associated with nausea, photophobia and phonophobia.  Occurs every 6 to 8 weeks.  Lasts 3 days but aborts quickly with sumatriptan (although it causes head pressure).  May be associated with poor sleep, stress and alcohol.   In June 2019, she developed right lower extremity DVT.  She is on chronic Xarelto as no cause was found.  In early 2020, she started experiencing strange sensation in the right leg below the knee that would make it difficult to bear weight.  No pain or actual numbness although she has prior history of right sciatica.    Past NSAIDS/analgesics:  Excedrin Past abortive triptans:  none Past abortive ergotamine:  none Past muscle relaxants:  none Past anti-emetic:  none Past antihypertensive medications:  none Past antidepressant medications:  none Past anticonvulsant medications:  none Past anti-CGRP:  none Past vitamins/Herbal/Supplements:  B12, D Past antihistamines/decongestants:  Sudafed  Images: 07/20/2022 MRI BRAIN W WO:  primarily periventricular T2/FLAIR hyperintensities in  the cerebral white matter.   08/16/2022 MRI C & T-SPINE W WO:  non-enhancing hyperintense lesion at the C2 level of spinal cord as well as T2 hyperintense lesion at the T2-T3 level of spinal cord with possible mild contrast enhancement.     PAST MEDICAL HISTORY: Past Medical History:   Diagnosis Date   Acid reflux    Anemia    Anxiety 09/15/21   Back pain    Bilateral bunions    Chest pain    Constipation    DVT (deep venous thrombosis) (HCC)    right leg   Dyspnea    Eczema    Encounter for general adult medical examination with abnormal findings 02/02/2022   Gestational diabetes    HTN (hypertension)    Lactose intolerance    Leg edema    Vitamin D deficiency     MEDICATIONS: Current Outpatient Medications on File Prior to Visit  Medication Sig Dispense Refill   fluocinolone (SYNALAR) 0.01 % external solution Apply topically 2 (two) times daily as needed. 60 mL 1   hydrocortisone 2.5 % ointment Apply topically twice daily as need to red sandpapery rash. 30 g 2   hydrocortisone 2.5 % ointment APPLY TOPICALLY TWICE A DAY 453.6 g 1   rivaroxaban (XARELTO) 20 MG TABS tablet Take 1 tablet by mouth daily.     topiramate (TOPAMAX) 25 MG tablet Take by mouth.     Ubrogepant (UBRELVY) 100 MG TABS Take 1 tablet (100 mg total) by mouth as needed. May repeat after 2 hours.  Maximum 2 tablets in 24 hours. 16 tablet 11   Vitamin D, Ergocalciferol, (DRISDOL) 1.25 MG (50000 UNIT) CAPS capsule Take 1 capsule (50,000 Units total) by mouth every 7 (seven) days. 4 capsule 0   No current facility-administered medications on file prior to visit.    ALLERGIES: Allergies  Allergen Reactions   Dye Fdc Blue 1 [Fd&C Blue #1 (Brilliant Blue)] Itching and Rash   Nickel Itching and Rash    FAMILY HISTORY: Family History  Problem Relation Age of Onset   Hypertension Mother    Heart disease Mother    Thyroid disease Mother    Obesity Mother    Stroke Maternal Aunt    Stroke Maternal Uncle    Migraines Maternal Uncle       Objective:  Blood pressure (!) 141/86, pulse 63, height 5\' 6"  (1.676 m), weight 225 lb (102.1 kg), last menstrual period 04/08/2019, SpO2 99%. General: No acute distress.  Patient appears well-groomed.   Head:  Normocephalic/atraumatic Eyes:  Fundi  examined but not visualized Neck: supple, no paraspinal tenderness, full range of motion Heart:  Regular rate and rhythm Neurological Exam: alert and oriented.  Speech fluent and not dysarthric, language intact.  CN II-XII intact. Bulk and tone normal, muscle strength 5/5 throughout.  Sensation to pinprick reduced in feet (presumably due to swelling), vibratory sensation intact.  Deep tendon reflexes 2+ upper extremities, absent lower extremities, toes downgoing.  Finger to nose testing intact.  Gait normal, Romberg negative.    Shon Millet, DO  CC: Trena Platt, MD

## 2022-12-12 ENCOUNTER — Other Ambulatory Visit: Payer: No Typology Code available for payment source

## 2022-12-12 ENCOUNTER — Ambulatory Visit (INDEPENDENT_AMBULATORY_CARE_PROVIDER_SITE_OTHER): Payer: No Typology Code available for payment source | Admitting: Neurology

## 2022-12-12 ENCOUNTER — Encounter: Payer: Self-pay | Admitting: Neurology

## 2022-12-12 VITALS — BP 128/83 | HR 63 | Ht 66.0 in | Wt 225.0 lb

## 2022-12-12 DIAGNOSIS — G35 Multiple sclerosis: Secondary | ICD-10-CM | POA: Diagnosis not present

## 2022-12-12 DIAGNOSIS — G43009 Migraine without aura, not intractable, without status migrainosus: Secondary | ICD-10-CM | POA: Diagnosis not present

## 2022-12-12 DIAGNOSIS — G4762 Sleep related leg cramps: Secondary | ICD-10-CM

## 2022-12-12 NOTE — Patient Instructions (Signed)
Continue Ocrevus Continue vit D 10,000 I U daily Repeat MRIs in February Check CMP, Mg, vit D, immunoglobulin panel Follow up 6 months.

## 2022-12-13 LAB — COMPREHENSIVE METABOLIC PANEL
AG Ratio: 1.5 (calc) (ref 1.0–2.5)
ALT: 31 U/L — ABNORMAL HIGH (ref 6–29)
AST: 39 U/L — ABNORMAL HIGH (ref 10–30)
Albumin: 4.3 g/dL (ref 3.6–5.1)
Alkaline phosphatase (APISO): 58 U/L (ref 31–125)
BUN: 15 mg/dL (ref 7–25)
CO2: 29 mmol/L (ref 20–32)
Calcium: 9.4 mg/dL (ref 8.6–10.2)
Chloride: 104 mmol/L (ref 98–110)
Creat: 0.82 mg/dL (ref 0.50–0.99)
Globulin: 2.8 g/dL (ref 1.9–3.7)
Glucose, Bld: 89 mg/dL (ref 65–139)
Potassium: 4.3 mmol/L (ref 3.5–5.3)
Sodium: 139 mmol/L (ref 135–146)
Total Bilirubin: 0.9 mg/dL (ref 0.2–1.2)
Total Protein: 7.1 g/dL (ref 6.1–8.1)

## 2022-12-13 LAB — IGG, IGA, IGM
IgG (Immunoglobin G), Serum: 1581 mg/dL (ref 600–1640)
IgM, Serum: 79 mg/dL (ref 50–300)
Immunoglobulin A: 216 mg/dL (ref 47–310)

## 2022-12-13 LAB — VITAMIN D 25 HYDROXY (VIT D DEFICIENCY, FRACTURES): Vit D, 25-Hydroxy: 94 ng/mL (ref 30–100)

## 2022-12-13 LAB — MAGNESIUM: Magnesium: 2.1 mg/dL (ref 1.5–2.5)

## 2022-12-17 ENCOUNTER — Ambulatory Visit
Admission: RE | Admit: 2022-12-17 | Discharge: 2022-12-17 | Disposition: A | Discharge status: Home / Self Care | Payer: No Typology Code available for payment source | Source: Ambulatory Visit | Attending: Internal Medicine | Admitting: Internal Medicine

## 2022-12-17 DIAGNOSIS — Z1231 Encounter for screening mammogram for malignant neoplasm of breast: Secondary | ICD-10-CM

## 2022-12-19 ENCOUNTER — Other Ambulatory Visit: Payer: Self-pay | Admitting: Internal Medicine

## 2022-12-19 DIAGNOSIS — R928 Other abnormal and inconclusive findings on diagnostic imaging of breast: Secondary | ICD-10-CM

## 2023-01-04 ENCOUNTER — Ambulatory Visit
Admission: RE | Admit: 2023-01-04 | Discharge: 2023-01-04 | Disposition: A | Discharge status: Home / Self Care | Payer: No Typology Code available for payment source | Source: Ambulatory Visit | Attending: Internal Medicine | Admitting: Internal Medicine

## 2023-01-04 DIAGNOSIS — R928 Other abnormal and inconclusive findings on diagnostic imaging of breast: Secondary | ICD-10-CM

## 2023-01-08 ENCOUNTER — Ambulatory Visit: Payer: No Typology Code available for payment source | Admitting: Internal Medicine

## 2023-02-06 ENCOUNTER — Ambulatory Visit (INDEPENDENT_AMBULATORY_CARE_PROVIDER_SITE_OTHER): Payer: No Typology Code available for payment source | Admitting: Internal Medicine

## 2023-02-06 ENCOUNTER — Encounter: Payer: Self-pay | Admitting: Internal Medicine

## 2023-02-06 VITALS — BP 126/84 | HR 75 | Ht 66.0 in | Wt 220.2 lb

## 2023-02-06 DIAGNOSIS — G35 Multiple sclerosis: Secondary | ICD-10-CM

## 2023-02-06 DIAGNOSIS — G43019 Migraine without aura, intractable, without status migrainosus: Secondary | ICD-10-CM

## 2023-02-06 DIAGNOSIS — E559 Vitamin D deficiency, unspecified: Secondary | ICD-10-CM | POA: Diagnosis not present

## 2023-02-06 DIAGNOSIS — Z86718 Personal history of other venous thrombosis and embolism: Secondary | ICD-10-CM

## 2023-02-06 DIAGNOSIS — Z0001 Encounter for general adult medical examination with abnormal findings: Secondary | ICD-10-CM

## 2023-02-06 NOTE — Assessment & Plan Note (Signed)
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 at [facility] [place] 

## 2023-02-06 NOTE — Progress Notes (Signed)
Established Patient Office Visit  Subjective:  Patient ID: Allison Valenzuela, female    DOB: Jan 26, 1980  Age: 43 y.o. MRN: [id number]  CC:  Chief Complaint  Patient presents with   Annual Exam    HPI Allison Valenzuela is a 43 y.o. female with past medical history of MS, unprovoked DVT and obesity s/p gastric bypass surgery who presents for annual physical.  She has recently started Ocrevus for multiple sclerosis.  She had leukopenia recently, but has longstanding history of borderline low WBC count. Her WBC count improved slightly on follow up check with Hematology in 12/24. Denies any recent URTI or other infections.  She is currently taking vitamin D 10,000 IU every other day now. Her last Vit D level was 94 in 11/24.  She currently denies any nausea, vomiting, abdominal pain, or change in bowel habits.    Past Medical History:  Diagnosis Date   Acid reflux    Anemia    Anxiety 09/15/21   Back pain    Bilateral bunions    Chest pain    Constipation    DVT (deep venous thrombosis) (HCC)    right leg   Dyspnea    Eczema    Encounter for general adult medical examination with abnormal findings 02/02/2022   Gestational diabetes    HTN (hypertension)    Lactose intolerance    Leg edema    Vitamin D deficiency     Past Surgical History:  Procedure Laterality Date   LAPAROSCOPIC VAGINAL HYSTERECTOMY WITH SALPINGECTOMY Bilateral 05/06/2019   Procedure: LAPAROSCOPIC ASSISTED VAGINAL HYSTERECTOMY WITH SALPINGECTOMY;  Surgeon: Geryl Rankins, MD;  Location: [facility] [place];  Service: Gynecology;  Laterality: Bilateral;  SITE ALSO VAGINA   ROUX-EN-Y PROCEDURE     TONSILLECTOMY     TOTAL ABDOMINAL HYSTERECTOMY     04/22    Family History  Problem Relation Age of Onset   Hypertension Mother    Heart disease Mother    Thyroid disease Mother    Obesity Mother    Stroke Maternal Aunt    Stroke Maternal Uncle    Migraines Maternal Uncle     Social History    Socioeconomic History   Marital status: Married    Spouse name: Navpreet Wease   Number of children: 3   Years of education: Not on file   Highest education level: Associate degree: academic program  Occupational History   Occupation: Tree surgeon  Tobacco Use   Smoking status: Never   Smokeless tobacco: Never  Vaping Use   Vaping status: Never Used  Substance and Sexual Activity   Alcohol use: Not Currently    Comment: Social drinker   Drug use: Never   Sexual activity: Yes    Birth control/protection: None  Other Topics Concern   Not on file  Social History Narrative   Not on file   Social Drivers of Health   Financial Resource Strain: Low Risk  (08/06/2022)   Overall Financial Resource Strain (CARDIA)    Difficulty of Paying Living Expenses: Not hard at all  Food Insecurity: No Food Insecurity (08/06/2022)   Hunger Vital Sign    Worried About Running Out of Food in the Last Year: Never true    Ran Out of Food in the Last Year: Never true  Transportation Needs: No Transportation Needs (08/06/2022)   PRAPARE - Administrator, Civil Service (Medical): No    Lack of Transportation (Non-Medical): No  Physical Activity: Sufficiently  Active (08/06/2022)   Exercise Vital Sign    Days of Exercise per Week: 5 days    Minutes of Exercise per Session: 30 min  Stress: No Stress Concern Present (08/06/2022)   [employer] of Occupational Health - Occupational Stress Questionnaire    Feeling of Stress : Not at all  Social Connections: Moderately Isolated (08/06/2022)   Social Connection and Isolation Panel [NHANES]    Frequency of Communication with Friends and Family: Three times a week    Frequency of Social Gatherings with Friends and Family: Once a week    Attends Religious Services: Never    Database administrator or Organizations: No    Attends Engineer, structural: Not on file    Marital Status: Married  Catering manager Violence: Not on file     Outpatient Medications Prior to Visit  Medication Sig Dispense Refill   fluocinolone (SYNALAR) 0.01 % external solution Apply topically 2 (two) times daily as needed. 60 mL 1   hydrocortisone 2.5 % ointment Apply topically twice daily as need to red sandpapery rash. 30 g 2   hydrocortisone 2.5 % ointment APPLY TOPICALLY TWICE A DAY 453.6 g 1   rivaroxaban (XARELTO) 20 MG TABS tablet Take 1 tablet by mouth daily.     topiramate (TOPAMAX) 25 MG tablet Take by mouth.     Ubrogepant (UBRELVY) 100 MG TABS Take 1 tablet (100 mg total) by mouth as needed. May repeat after 2 hours.  Maximum 2 tablets in 24 hours. 16 tablet 11   Vitamin D, Ergocalciferol, (DRISDOL) 1.25 MG (50000 UNIT) CAPS capsule Take 1 capsule (50,000 Units total) by mouth every 7 (seven) days. 4 capsule 0   No facility-administered medications prior to visit.    Allergies  Allergen Reactions   Dye Fdc Blue 1 [Fd&C Blue #1 (Brilliant Blue)] Itching and Rash   Nickel Itching and Rash    ROS Review of Systems  Constitutional:  Negative for chills and fever.  HENT:  Negative for congestion and sore throat.   Eyes:  Negative for pain and discharge.  Respiratory:  Negative for cough and shortness of breath.   Cardiovascular:  Negative for chest pain and palpitations.  Gastrointestinal:  Negative for abdominal pain, diarrhea, nausea and vomiting.  Endocrine: Negative for polydipsia and polyuria.  Genitourinary:  Negative for dysuria and hematuria.  Musculoskeletal:  Negative for neck pain and neck stiffness.  Skin:  Negative for rash.  Neurological:  Positive for weakness and numbness (B/l UE and LE).  Psychiatric/Behavioral:  Positive for sleep disturbance. Negative for agitation and behavioral problems.       Objective:    Physical Exam Vitals reviewed.  Constitutional:      General: She is not in acute distress.    Appearance: She is obese. She is not diaphoretic.  HENT:     Head: Normocephalic and  atraumatic.     Nose: Nose normal.     Mouth/Throat:     Mouth: Mucous membranes are moist.  Eyes:     General: No scleral icterus.    Extraocular Movements: Extraocular movements intact.  Cardiovascular:     Rate and Rhythm: Normal rate and regular rhythm.     Heart sounds: Normal heart sounds. No murmur heard. Pulmonary:     Breath sounds: Normal breath sounds. No wheezing or rales.  Abdominal:     Palpations: Abdomen is soft.     Tenderness: There is no abdominal tenderness.  Musculoskeletal:  Cervical back: Neck supple. No tenderness.     Right lower leg: No edema.     Left lower leg: No edema.  Skin:    General: Skin is warm.     Findings: No rash.  Neurological:     General: No focal deficit present.     Mental Status: She is alert and oriented to person, place, and time.     Motor: No weakness.     Gait: Gait normal.  Psychiatric:        Mood and Affect: Mood normal.        Behavior: Behavior normal.     BP 126/84 (BP Location: Right Arm, Patient Position: Sitting, Cuff Size: Normal)   Pulse 75   Ht 5\' 6"  (1.676 m)   Wt 220 lb 3.2 oz (99.9 kg)   LMP 04/08/2019   SpO2 98%   BMI 35.54 kg/m  Wt Readings from Last 3 Encounters:  02/06/23 220 lb 3.2 oz (99.9 kg)  12/12/22 225 lb (102.1 kg)  12/10/22 222 lb (100.7 kg)    Lab Results  Component Value Date   TSH 0.850 02/02/2022   Lab Results  Component Value Date   WBC 2.3 (L) 09/07/2022   HGB 12.4 09/07/2022   HCT 37.3 09/07/2022   MCV 95.2 09/07/2022   PLT 232 09/07/2022   Lab Results  Component Value Date   NA 139 12/12/2022   K 4.3 12/12/2022   CO2 29 12/12/2022   GLUCOSE 89 12/12/2022   BUN 15 12/12/2022   CREATININE 0.82 12/12/2022   BILITOT 0.9 12/12/2022   ALKPHOS 47 08/28/2022   AST 39 (H) 12/12/2022   ALT 31 (H) 12/12/2022   PROT 7.1 12/12/2022   ALBUMIN 4.3 08/28/2022   CALCIUM 9.4 12/12/2022   ANIONGAP 7 05/17/2019   EGFR 76 08/28/2022   Lab Results  Component Value Date    CHOL 183 08/28/2022   Lab Results  Component Value Date   HDL 70 08/28/2022   Lab Results  Component Value Date   LDLCALC 103 (H) 08/28/2022   Lab Results  Component Value Date   TRIG 49 08/28/2022   Lab Results  Component Value Date   CHOLHDL 2.6 08/28/2022   Lab Results  Component Value Date   HGBA1C 4.8 07/27/2021      Assessment & Plan:   Problem List Items Addressed This Visit       Cardiovascular and Mediastinum   Migraine   Followed by neurology Has Ubrelvy as needed for migraine        Nervous and Auditory   Multiple sclerosis (HCC)   On Ocrevus Followed by Neurology        Other   History of DVT of lower extremity (Chronic)   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 at [facility]      Vitamin D deficiency   Last vitamin D Lab Results  Component Value Date   VD25OH 32 12/12/2022    Had been taking Vitamin D 30,000 IU once daily - can be hazardous, advised to take Vit D 10,000 IU once every other day in the last visit      Encounter for general adult medical examination with abnormal findings - Primary   Physical exam as documented. Fasting blood tests reviewed.       No orders of the defined types were placed in this encounter.   Follow-up: Return in about 1 year (around 02/06/2024).    Jamillia Closson  Concha Se, MD

## 2023-02-06 NOTE — Assessment & Plan Note (Signed)
Last vitamin D Lab Results  Component Value Date   VD25OH 43 12/12/2022    Had been taking Vitamin D 30,000 IU once daily - can be hazardous, advised to take Vit D 10,000 IU once every other day in the last visit

## 2023-02-06 NOTE — Patient Instructions (Signed)
Please continue to take medications as prescribed. ? ?Please continue to follow low carb diet and perform moderate exercise/walking at least 150 mins/week. ?

## 2023-02-06 NOTE — Assessment & Plan Note (Signed)
 On Ocrevus Followed by Neurology

## 2023-02-06 NOTE — Assessment & Plan Note (Addendum)
Physical exam as documented. Fasting blood tests reviewed. 

## 2023-02-06 NOTE — Assessment & Plan Note (Signed)
 Followed by neurology Has Bernita Raisin as needed for migraine

## 2023-02-13 ENCOUNTER — Encounter: Payer: Self-pay | Admitting: Neurology

## 2023-02-13 NOTE — Telephone Encounter (Signed)
ERROR

## 2023-02-20 ENCOUNTER — Inpatient Hospital Stay: Admission: RE | Admit: 2023-02-20 | Payer: No Typology Code available for payment source | Source: Ambulatory Visit

## 2023-02-20 ENCOUNTER — Other Ambulatory Visit: Payer: No Typology Code available for payment source

## 2023-02-23 ENCOUNTER — Ambulatory Visit
Admission: RE | Admit: 2023-02-23 | Discharge: 2023-02-23 | Disposition: A | Discharge status: Home / Self Care | Payer: No Typology Code available for payment source | Source: Ambulatory Visit | Attending: Neurology | Admitting: Neurology

## 2023-02-23 DIAGNOSIS — G35 Multiple sclerosis: Secondary | ICD-10-CM

## 2023-02-23 MED ORDER — GADOPICLENOL 0.5 MMOL/ML IV SOLN
10.0000 mL | Freq: Once | INTRAVENOUS | Status: AC | PRN
  Administered 2023-02-23: 10 mL via INTRAVENOUS

## 2023-02-26 ENCOUNTER — Telehealth: Payer: Self-pay

## 2023-02-26 NOTE — Telephone Encounter (Signed)
Letter received from [facility] MRI Cevical and Thoracic denied.  Message: MRI approved yearly unless we can prove that the patient has new symptoms or Worsening symptoms

## 2023-02-26 NOTE — Telephone Encounter (Signed)
Per Dr.Jaffe MRI okay to have in Aug if no new symptoms or Worsening symptoms.

## 2023-02-26 NOTE — Telephone Encounter (Signed)
Pt called in and left a message. She is returning [facility] call

## 2023-02-28 NOTE — Progress Notes (Signed)
 Virtual Visit via Video Note  Consent was obtained for video visit:  Yes.   Answered questions that patient had about telehealth interaction:  Yes.   I discussed the limitations, risks, security and privacy concerns of performing an evaluation and management service by telemedicine. I also discussed with the patient that there may be a patient responsible charge related to this service. The patient expressed understanding and agreed to proceed.  Pt location: Home Physician Location: office Name of referring provider:  Anabel Halon, MD I connected with Rodell Perna at patients initiation/request on 03/04/2023 at 10:50 AM EST by video enabled telemedicine application and verified that I am speaking with the correct person using two identifiers. Pt MRN:  [id number] Pt DOB:  30-Oct-1980 Video Participants:  Rodell Perna;  Assessment/Plan:   Multiple sclerosis, relapsing-remitting. Migraine without aura, without status migrainosus, not intractable Nocturnal leg cramps Leukopenia, unknown etiology but now on Ocrevus, followed by hematology who is also monitoring closely. Elevated liver enzymes.       DMT:  Emogene Morgan Vit D 10,000 I U every other day. Check hepatic panel, immunoglobulin panel, vit D today and again in 6 months. Recheck MRI of cervical/thoracic with and without contrast in August. Follow up in 6 months.   Subjective:  Allison Valenzuela is a 43 year old female with HTN and history of right LE DVT on Xarelto who follows up for multiple sclerosis.  Most recent MRIs personally reviewed.  UPDATE: DMTEmogene Morgan (started 9/20 & 10/22/2022) Other:  D 10,000 I U every other day  02/23/2023 MRI BRAIN W WO:  1. Chronic cerebral white matter demyelinating disease stable since 07/20/2022. No progressed or active demyelination identified. 2. No new intracranial abnormality. Insurance would not approve MRI of cervical and thoracic spine for a year.  12/12/2022 LABS:  IgA  216, IgG 1581, IgM 79; CMP with Na 139, K 4.3, Cl 104, CO2 29, Ca 9.4, glocuse 89, BUN 15, Cr 0.82, t bili 0.9, ALP 58, AST 39, ALT 31; Mg 2.1; vit D 94 01/11/2023 LABS:  CBC with WBC 2.74, HGB 12.7, HCT 37.3, PLT 235, ANC 1.1, ALC 1.1  Has facial tingling from time to time, 15 seconds at a time.     Migraines are controlled.  Usually if doesn't get enough sleep.        Current NSAIDS/analgesics:  none Current triptans:  sumatriptan 50mg  Current ergotamine:  none Current anti-emetic:  none Current muscle relaxants:  none Current Antihypertensive medications:  none Current Antidepressant medications:  none Current Anticonvulsant medications:  none Current anti-CGRP:  none Current Vitamins/Herbal/Supplements:  D Current Antihistamines/Decongestants:  none Other therapy:  none Birth control:  none Other medications:  Xarelto   HISTORY: In 2009, she developed right sided numbness and tingling of face, torso, arm and leg after starting OCP.  She was advised to discontinue it and symptoms resolved.  She never had an MRI of the brain.  She had a recurrence of similar right sided numbness and tingling around February-March 2024, but this time intermittent lasting 10 minutes and occurring about twice a month.  No associated headache.  MRI of brain and spinal cord showed non-enhancing lesions in the brain and spinal cord.   She has history of migraines in childhood but she grew out of them.  They restarted in late 2023-early 2024.  They are a moderate right periorbital nonthrobbing pain sometimes associated with nausea, photophobia and phonophobia.  Occurs every 6 to 8 weeks.  Lasts 3 days but aborts quickly with sumatriptan (although it causes head pressure).  May be associated with poor sleep, stress and alcohol.   In June 2019, she developed right lower extremity DVT.  She is on chronic Xarelto as no cause was found.  In early 2020, she started experiencing strange sensation in the right leg below  the knee that would make it difficult to bear weight.  No pain or actual numbness although she has prior history of right sciatica.    Past NSAIDS/analgesics:  Excedrin Past abortive triptans:  none Past abortive ergotamine:  none Past muscle relaxants:  none Past anti-emetic:  none Past antihypertensive medications:  none Past antidepressant medications:  none Past anticonvulsant medications:  none Past anti-CGRP:  none Past vitamins/Herbal/Supplements:  B12, D Past antihistamines/decongestants:  Sudafed  Labs: 08/2022:  Acute Hep Panel/Hep B surface Ab negative; Quantiferon-TB Gold Plus negative; JCV Ab positive with index 2.36  Images: 07/20/2022 MRI BRAIN W WO:  primarily periventricular T2/FLAIR hyperintensities in the cerebral white matter.   08/16/2022 MRI C & T-SPINE W WO:  non-enhancing hyperintense lesion at the C2 level of spinal cord as well as T2 hyperintense lesion at the T2-T3 level of spinal cord with possible mild contrast enhancement.    Past Medical History: Past Medical History:  Diagnosis Date   Acid reflux    Anemia    Anxiety 09/15/21   Back pain    Bilateral bunions    Chest pain    Constipation    DVT (deep venous thrombosis) (HCC)    right leg   Dyspnea    Eczema    Encounter for general adult medical examination with abnormal findings 02/02/2022   Gestational diabetes    HTN (hypertension)    Lactose intolerance    Leg edema    Vitamin D deficiency     Medications: Outpatient Encounter Medications as of 03/04/2023  Medication Sig   hydrocortisone 2.5 % ointment Apply topically twice daily as need to red sandpapery rash.   hydrocortisone 2.5 % ointment APPLY TOPICALLY TWICE A DAY   rivaroxaban (XARELTO) 20 MG TABS tablet Take 1 tablet by mouth daily.   topiramate (TOPAMAX) 25 MG tablet Take by mouth.   Ubrogepant (UBRELVY) 100 MG TABS Take 1 tablet (100 mg total) by mouth as needed. May repeat after 2 hours.  Maximum 2 tablets in 24 hours.    Vitamin D, Ergocalciferol, (DRISDOL) 1.25 MG (50000 UNIT) CAPS capsule Take 1 capsule (50,000 Units total) by mouth every 7 (seven) days.   [DISCONTINUED] fluocinolone (SYNALAR) 0.01 % external solution Apply topically 2 (two) times daily as needed.   No facility-administered encounter medications on file as of 03/04/2023.    Allergies: Allergies  Allergen Reactions   Dye Fdc Blue 1 [Fd&C Blue #1 (Brilliant Blue)] Itching and Rash   Nickel Itching and Rash    Family History: Family History  Problem Relation Age of Onset   Hypertension Mother    Heart disease Mother    Thyroid disease Mother    Obesity Mother    Stroke Maternal Aunt    Stroke Maternal Uncle    Migraines Maternal Uncle     Observations/Objective:   No acute distress.  Alert and oriented.  Speech fluent and not dysarthric.  Language intact.     Follow Up Instructions:    -I discussed the assessment and treatment plan with the patient. The patient was provided an opportunity to ask questions and all were answered. The patient  agreed with the plan and demonstrated an understanding of the instructions.   The patient was advised to call back or seek an in-person evaluation if the symptoms worsen or if the condition fails to improve as anticipated.   Cira Servant, DO   CC: Trena Platt, MD

## 2023-03-04 ENCOUNTER — Encounter: Payer: Self-pay | Admitting: Neurology

## 2023-03-04 ENCOUNTER — Telehealth (INDEPENDENT_AMBULATORY_CARE_PROVIDER_SITE_OTHER): Payer: No Typology Code available for payment source | Admitting: Neurology

## 2023-03-04 DIAGNOSIS — G43009 Migraine without aura, not intractable, without status migrainosus: Secondary | ICD-10-CM | POA: Diagnosis not present

## 2023-03-04 DIAGNOSIS — D72819 Decreased white blood cell count, unspecified: Secondary | ICD-10-CM | POA: Diagnosis not present

## 2023-03-04 DIAGNOSIS — G35 Multiple sclerosis: Secondary | ICD-10-CM | POA: Diagnosis not present

## 2023-03-04 NOTE — Addendum Note (Signed)
 Addended by: Leida Lauth on: 03/04/2023 12:09 PM   Modules accepted: Orders

## 2023-03-04 NOTE — Addendum Note (Signed)
 Addended by: Leida Lauth on: 03/04/2023 11:04 AM   Modules accepted: Orders

## 2023-03-04 NOTE — Addendum Note (Signed)
 Addended by: Leida Lauth on: 03/04/2023 11:12 AM   Modules accepted: Orders

## 2023-04-01 ENCOUNTER — Other Ambulatory Visit

## 2023-04-02 LAB — VITAMIN D 25 HYDROXY (VIT D DEFICIENCY, FRACTURES): Vit D, 25-Hydroxy: 88 ng/mL (ref 30–100)

## 2023-04-02 LAB — HEPATIC FUNCTION PANEL
AG Ratio: 1.4 (calc) (ref 1.0–2.5)
ALT: 13 U/L (ref 6–29)
AST: 18 U/L (ref 10–30)
Albumin: 4.1 g/dL (ref 3.6–5.1)
Alkaline phosphatase (APISO): 40 U/L (ref 31–125)
Bilirubin, Direct: 0.2 mg/dL (ref 0.0–0.2)
Globulin: 2.9 g/dL (ref 1.9–3.7)
Indirect Bilirubin: 0.8 mg/dL (ref 0.2–1.2)
Total Bilirubin: 1 mg/dL (ref 0.2–1.2)
Total Protein: 7 g/dL (ref 6.1–8.1)

## 2023-04-02 LAB — IGG, IGA, IGM
IgG (Immunoglobin G), Serum: 1679 mg/dL — ABNORMAL HIGH (ref 600–1640)
IgM, Serum: 76 mg/dL (ref 50–300)
Immunoglobulin A: 239 mg/dL (ref 47–310)

## 2023-04-22 ENCOUNTER — Ambulatory Visit (INDEPENDENT_AMBULATORY_CARE_PROVIDER_SITE_OTHER): Payer: No Typology Code available for payment source

## 2023-04-22 VITALS — BP 117/79 | HR 71 | Temp 98.2°F | Resp 18 | Ht 66.0 in | Wt 221.0 lb

## 2023-04-22 DIAGNOSIS — G35 Multiple sclerosis: Secondary | ICD-10-CM

## 2023-04-22 MED ORDER — METHYLPREDNISOLONE SODIUM SUCC 125 MG IJ SOLR
125.0000 mg | Freq: Once | INTRAMUSCULAR | Status: AC
  Administered 2023-04-22: 125 mg via INTRAVENOUS
  Filled 2023-04-22: qty 2

## 2023-04-22 MED ORDER — DIPHENHYDRAMINE HCL 25 MG PO CAPS
50.0000 mg | ORAL_CAPSULE | Freq: Once | ORAL | Status: AC
Start: 2023-04-22 — End: 2023-04-22
  Administered 2023-04-22: 50 mg via ORAL
  Filled 2023-04-22: qty 2

## 2023-04-22 MED ORDER — ACETAMINOPHEN 325 MG PO TABS
650.0000 mg | ORAL_TABLET | Freq: Once | ORAL | Status: AC
  Administered 2023-04-22: 650 mg via ORAL
  Filled 2023-04-22: qty 2

## 2023-04-22 MED ORDER — SODIUM CHLORIDE 0.9 % IV SOLN
600.0000 mg | Freq: Once | INTRAVENOUS | Status: AC
  Administered 2023-04-22: 600 mg via INTRAVENOUS
  Filled 2023-04-22: qty 20

## 2023-04-22 NOTE — Progress Notes (Signed)
 Diagnosis: Multiple Sclerosis  Provider:  Chilton Greathouse MD  Procedure: IV Infusion  IV Type: Peripheral, IV Location: L Hand  Ocrevus (Ocrelizumab), Dose: 600 mg  Infusion Start Time: 1051  Infusion Stop Time: 1436  Post Infusion IV Care: Patient declined observation and Peripheral IV Discontinued  Discharge: Condition: Good, Destination: Home . AVS Declined  Performed by:  Garnette Czech, RN

## 2023-04-23 ENCOUNTER — Ambulatory Visit: Admitting: Podiatry

## 2023-04-24 ENCOUNTER — Telehealth: Payer: Self-pay | Admitting: Pharmacy Technician

## 2023-04-24 NOTE — Telephone Encounter (Addendum)
 Auth Submission: APPROVED Site of care: Site of care: [facility] Payer: AETNA Medication & CPT/J Code(s) submitted: Ocrevus Mellody Life) [phone] Route of submission (phone, fax, portal):  Phone # Fax # Auth type: Buy/Bill PB Units/visits requested:   Reference number:300mg  x2 doses Approval from: 09/15/23 to 09/14/23   Co-pay card:  atlas aware / pending

## 2023-04-24 NOTE — Telephone Encounter (Addendum)
 Auth Submission: APPROVED  WILL NEED TO SUBMIT RETRO AUTH. PATIENT RECEIVED TX ON 04/22/23. RENEWAL WAS SUBMITTED 04/24/23  Site of care: Site of care: [facility] Payer: AETNA Medication & CPT/J Code(s) submitted: Ocrevus (Ocrelizumba) [phone] Route of submission (phone, fax, portal):  Phone # Fax # Auth type: Buy/Bill PB Units/visits requested: 600MG  X 2 DOSES Reference number: [id number] Approval from:  04/24/23 - 04/22/24

## 2023-04-29 ENCOUNTER — Encounter: Payer: Self-pay | Admitting: Neurology

## 2023-04-29 ENCOUNTER — Telehealth: Payer: Self-pay | Admitting: Pharmacy Technician

## 2023-04-29 NOTE — Telephone Encounter (Addendum)
 Auth Submission: APPROVED Site of care: Site of care: [facility] Payer: AETNA Medication & CPT/J Code(s) submitted: Ocrevus  Antoniette) [phone] Route of submission (phone, fax, portal):  Phone # Fax # Auth type: Buy/Bill PB Units/visits requested: 600MG  Q6  MONTHS X 2 DOSES Reference number: [id number] Approval from: 04/24/23 to 04/22/24

## 2023-04-30 ENCOUNTER — Ambulatory Visit: Admitting: Podiatry

## 2023-04-30 DIAGNOSIS — L600 Ingrowing nail: Secondary | ICD-10-CM | POA: Diagnosis not present

## 2023-04-30 DIAGNOSIS — B351 Tinea unguium: Secondary | ICD-10-CM | POA: Diagnosis not present

## 2023-04-30 MED ORDER — EFINACONAZOLE 10 % EX SOLN: 1.0000 [drp] | Freq: Every day | CUTANEOUS | 11 refills | Status: AC

## 2023-04-30 NOTE — Progress Notes (Signed)
 Subjective: Chief Complaint  Patient presents with   Nail Problem    RM#11 Top pain in the right big toe I had previously came there for laser treatment for fungus in there. I feel like I also may have an ingrown toenail not sure I just having some pain.   43 year old female presents the office with above concerns.  She said that she is not sure if is going for ingrown toenail or nail fungus which is causing her discomfort.  She has not had any pain at this time.  Denies any swelling redness or any drainage.  She is currently not doing any treatment for nail fungus.  Objective: AAO x3, NAD DP/PT pulses palpable bilaterally, CRT less than 3 seconds On the right hallux toenail is mildly hypertrophic, dystrophic with brown discoloration.  There is no edema, erythema or signs of infection but there is incurvation present to the nail borders.  No open lesions. No pain with calf compression, swelling, warmth, erythema  Assessment: Ingrown toenail right hallux, onychomycosis  Plan: -All treatment options discussed with the patient including all alternatives, risks, complications.  -We discussed partial nail avulsion of the ingrown toenail.  She has been rescheduled for this. -In regards to the nail fungus we discussed oral, topical as well as alternative treatments.  Prescribed Jublia. -Patient encouraged to call the office with any questions, concerns, change in symptoms.   Allison Valenzuela DPM

## 2023-05-08 ENCOUNTER — Encounter: Payer: Self-pay | Admitting: Neurology

## 2023-05-09 ENCOUNTER — Telehealth: Payer: Self-pay

## 2023-05-09 DIAGNOSIS — G35 Multiple sclerosis: Secondary | ICD-10-CM

## 2023-05-09 NOTE — Telephone Encounter (Signed)
 Per Dr.Jaffe, I would like to set her up for Solu-Medrol  1000mg  IV for 3 days.  I would like to get MRIs of brain/cervical/thoracic spine with and without contrast as soon as possible.    Per Patient mychart message, I will do the infusion and do I have to call for the MRI or are they gonna call me?

## 2023-05-10 ENCOUNTER — Encounter: Payer: Self-pay | Admitting: Neurology

## 2023-05-10 ENCOUNTER — Telehealth: Payer: Self-pay

## 2023-05-10 ENCOUNTER — Ambulatory Visit
Admission: RE | Admit: 2023-05-10 | Discharge: 2023-05-10 | Disposition: A | Discharge status: Home / Self Care | Source: Ambulatory Visit | Attending: Neurology | Admitting: Neurology

## 2023-05-10 ENCOUNTER — Other Ambulatory Visit: Payer: Self-pay | Admitting: Neurology

## 2023-05-10 ENCOUNTER — Ambulatory Visit (INDEPENDENT_AMBULATORY_CARE_PROVIDER_SITE_OTHER)

## 2023-05-10 VITALS — BP 103/58 | HR 76 | Temp 97.4°F | Resp 16 | Ht 67.0 in | Wt 223.0 lb

## 2023-05-10 DIAGNOSIS — G35 Multiple sclerosis: Secondary | ICD-10-CM

## 2023-05-10 DIAGNOSIS — Z0279 Encounter for issue of other medical certificate: Secondary | ICD-10-CM

## 2023-05-10 DIAGNOSIS — G35D Multiple sclerosis, unspecified: Secondary | ICD-10-CM

## 2023-05-10 MED ORDER — SODIUM CHLORIDE 0.9 % IV SOLN
1000.0000 mg | Freq: Once | INTRAVENOUS | Status: AC
  Administered 2023-05-10: 1000 mg via INTRAVENOUS
  Filled 2023-05-10: qty 16

## 2023-05-10 MED ORDER — ACETAMINOPHEN 325 MG PO TABS: 650.0000 mg | ORAL_TABLET | Freq: Once | ORAL | Status: DC

## 2023-05-10 MED ORDER — DIPHENHYDRAMINE HCL 25 MG PO CAPS: 50.0000 mg | ORAL_CAPSULE | Freq: Once | ORAL | Status: DC

## 2023-05-10 MED ORDER — GADOPICLENOL 0.5 MMOL/ML IV SOLN
7.0000 mL | Freq: Once | INTRAVENOUS | Status: AC | PRN
  Administered 2023-05-10: 7 mL via INTRAVENOUS

## 2023-05-10 MED ORDER — METHYLPREDNISOLONE SODIUM SUCC 125 MG IJ SOLR: 125.0000 mg | Freq: Once | INTRAMUSCULAR | Status: DC

## 2023-05-10 NOTE — Progress Notes (Signed)
 Diagnosis: Multiple Sclerosis  Provider:  Phyllis Breeze MD  Procedure: IV Infusion  IV Type: Peripheral, IV Location: R Antecubital  Solumedrol (Methylprednisolone ), Dose: 1000 mg  Infusion Start Time: 1515  Infusion Stop Time: 1616  Post Infusion IV Care: Peripheral IV Discontinued  Discharge: Condition: Good, Destination: Home . AVS Declined  Performed by:  Natividad Balding, RN

## 2023-05-10 NOTE — Telephone Encounter (Signed)
 Dr. Festus Hubert and Carron Clap, patient will be scheduled as soon as possible.  Auth Submission: NO AUTH NEEDED Site of care: Site of care: [facility] Payer: [employer]na commercial Medication & CPT/J Code(s) submitted: Solumedrol (Methylprednisolone ) [phone] Route of submission (phone, fax, portal): portal Phone # Fax # Auth type: Buy/Bill PB Units/visits requested: 1000mg  x 3 doses Reference number:  Approval from: 05/10/23 to 01/15/24

## 2023-05-12 ENCOUNTER — Ambulatory Visit
Admission: RE | Admit: 2023-05-12 | Discharge: 2023-05-12 | Disposition: A | Discharge status: Home / Self Care | Source: Ambulatory Visit | Attending: Neurology | Admitting: Neurology

## 2023-05-12 DIAGNOSIS — G35 Multiple sclerosis: Secondary | ICD-10-CM

## 2023-05-12 MED ORDER — GADOPICLENOL 0.5 MMOL/ML IV SOLN
10.0000 mL | Freq: Once | INTRAVENOUS | Status: AC | PRN
  Administered 2023-05-12: 10 mL via INTRAVENOUS

## 2023-05-13 ENCOUNTER — Ambulatory Visit (INDEPENDENT_AMBULATORY_CARE_PROVIDER_SITE_OTHER)

## 2023-05-13 VITALS — BP 118/81 | HR 55 | Temp 97.8°F | Resp 20 | Ht 66.0 in | Wt 228.6 lb

## 2023-05-13 DIAGNOSIS — G35 Multiple sclerosis: Secondary | ICD-10-CM

## 2023-05-13 MED ORDER — METHYLPREDNISOLONE SODIUM SUCC 1000 MG IJ SOLR
1000.0000 mg | Freq: Once | INTRAMUSCULAR | Status: AC
  Administered 2023-05-13: 1000 mg via INTRAVENOUS
  Filled 2023-05-13: qty 16

## 2023-05-13 NOTE — Progress Notes (Signed)
 Diagnosis: Multiple Sclerosis  Provider:  Mannam, Praveen MD  Procedure: IV Infusion  IV Type: Peripheral, IV Location: R Forearm  Solumedrol (Methylprednisolone ), Dose: 1000 mg  Infusion Start Time: 1610  Infusion Stop Time: 1710  Post Infusion IV Care: Patient declined observation and Peripheral IV Discontinued  Discharge: Condition: Stable, Destination: Home . AVS Declined  Performed by:  Lendel Quant, RN

## 2023-05-14 ENCOUNTER — Ambulatory Visit (INDEPENDENT_AMBULATORY_CARE_PROVIDER_SITE_OTHER)

## 2023-05-14 VITALS — BP 137/83 | HR 59 | Temp 97.9°F | Resp 16 | Ht 66.0 in | Wt 224.6 lb

## 2023-05-14 DIAGNOSIS — G35 Multiple sclerosis: Secondary | ICD-10-CM

## 2023-05-14 MED ORDER — SODIUM CHLORIDE 0.9 % IV SOLN
1000.0000 mg | Freq: Once | INTRAVENOUS | Status: AC
  Administered 2023-05-14: 1000 mg via INTRAVENOUS
  Filled 2023-05-14: qty 16

## 2023-05-14 NOTE — Progress Notes (Signed)
 Diagnosis: Multiple sclerosis (HCC)   Provider:  Phyllis Breeze MD  Procedure: IV Infusion  IV Type: Peripheral, IV Location: R Antecubital  Solumedrol (Methylprednisolone ), Dose: 1000 mg  Infusion Start Time: 1528  Infusion Stop Time: 1632  Post Infusion IV Care: Peripheral IV Discontinued  Discharge: Condition: Good, Destination: Home . AVS Declined  Performed by:  Lauran Pollard, LPN

## 2023-05-21 ENCOUNTER — Telehealth: Payer: Self-pay

## 2023-05-21 NOTE — Telephone Encounter (Signed)
 FMLA for MS,Headache filled out and faxed to [phone]

## 2023-05-22 ENCOUNTER — Ambulatory Visit
Admission: RE | Admit: 2023-05-22 | Discharge: 2023-05-22 | Disposition: A | Discharge status: Home / Self Care | Source: Ambulatory Visit | Attending: Neurology | Admitting: Neurology

## 2023-05-22 ENCOUNTER — Encounter: Payer: Self-pay | Admitting: Neurology

## 2023-05-22 DIAGNOSIS — G35 Multiple sclerosis: Secondary | ICD-10-CM

## 2023-05-22 MED ORDER — GADOPICLENOL 0.5 MMOL/ML IV SOLN
10.0000 mL | Freq: Once | INTRAVENOUS | Status: AC | PRN
  Administered 2023-05-22: 10 mL via INTRAVENOUS

## 2023-05-28 ENCOUNTER — Encounter: Payer: Self-pay | Admitting: Neurology

## 2023-06-04 ENCOUNTER — Ambulatory Visit: Admitting: Podiatry

## 2023-06-04 ENCOUNTER — Telehealth: Payer: Self-pay | Admitting: Pharmacy Technician

## 2023-06-04 ENCOUNTER — Other Ambulatory Visit (HOSPITAL_COMMUNITY): Payer: Self-pay

## 2023-06-04 NOTE — Telephone Encounter (Signed)
 Pharmacy Patient Advocate Encounter  Received notification from [facility] that Prior Authorization for UBRELVY  100MG  has been APPROVED from 5.20.25 to 5.20.26   PA #/Case ID/Reference #: [id number]

## 2023-06-04 NOTE — Telephone Encounter (Signed)
 Pharmacy Patient Advocate Encounter   Received notification from CoverMyMeds that prior authorization for UBRELVY  100MG  is required/requested.   Insurance verification completed.   The patient is insured through [facility] .   Per test claim: PA required; PA submitted to above mentioned insurance via CoverMyMeds Key/confirmation #/EOC [id number] Status is pending

## 2023-06-11 ENCOUNTER — Ambulatory Visit: Payer: No Typology Code available for payment source | Admitting: Neurology

## 2023-06-21 ENCOUNTER — Ambulatory Visit: Payer: Self-pay | Admitting: Neurology

## 2023-06-21 NOTE — Progress Notes (Signed)
 Patient advised.

## 2023-07-05 ENCOUNTER — Other Ambulatory Visit (HOSPITAL_COMMUNITY): Payer: Self-pay

## 2023-08-26 ENCOUNTER — Other Ambulatory Visit: Payer: No Typology Code available for payment source

## 2023-08-30 NOTE — Progress Notes (Deleted)
 NEUROLOGY FOLLOW UP OFFICE NOTE  Camora Ann Rennaker [id number]  Assessment/Plan:   Multiple sclerosis, relapsing-remitting. Migraine without aura, without status migrainosus, not intractable Nocturnal leg cramps Leukopenia, unknown etiology but now on Ocrevus , followed by hematology who is also monitoring closely. Elevated liver enzymes, normalized      DMT:  Ocrevus  Vit D 10,000 I U every other day. Check hepatic panel, immunoglobulin panel, vit D today and again in 6 months. Recheck MRI of cervical/thoracic with and without contrast in 2023/09/11. Follow up in 6 months.   Subjective:  Daysia A Seals is a 43 year old female with HTN and history of right LE DVT on Xarelto who follows up for multiple sclerosis.  MRIs personally reviewed.  UPDATE: DMT:  Ocrevus  (started 9/20 & 10/22/2022) Other:  D 10,000 I U every other day  05/12/2023 MRI BRAIN W WO:  1. Stable chronic demyelinating disease since February MRI. No active or progressive demyelination identified. 2. No new intracranial abnormality. 05/22/2023 MRI C-SPINE W WO:  Chronic demyelinating lesion at the C2 level. No definite new lesion or abnormal enhancement. 05/10/2023 MRI T-SPINE W WO:  1. Chronic thoracic spinal cord demyelination appears stable since 08-27-24most pronounced at T2-T3, T6, and the conus medullaris levels. No active or progressed demyelinating lesion identified. 2. Moderate chronic dextroconvex midthoracic scoliosis. But no thoracic disc herniation or spinal stenosis.  04/01/2023 LABS:  IgG 1679, IgM 76, IgA 239; vit D 88; hepatic panel with t bili 1.0, ALP 40, AST 18, ALT 13 04/11/2023 LABS:  CBC with WBC 3, HGB 12.8, HCT 38.4, PLT 228, ALC 1   Has facial tingling from time to time, 15 seconds at a time.     Migraines are controlled.  Usually if doesn't get enough sleep.        Current NSAIDS/analgesics:  none Current triptans:  sumatriptan  50mg  Current ergotamine:  none Current anti-emetic:   none Current muscle relaxants:  none Current Antihypertensive medications:  none Current Antidepressant medications:  none Current Anticonvulsant medications:  none Current anti-CGRP:  none Current Vitamins/Herbal/Supplements:  D Current Antihistamines/Decongestants:  none Other therapy:  none Birth control:  none Other medications:  Xarelto   HISTORY: In 2009, she developed right sided numbness and tingling of face, torso, arm and leg after starting OCP.  She was advised to discontinue it and symptoms resolved.  She never had an MRI of the brain.  She had a recurrence of similar right sided numbness and tingling around February-March 2024, but this time intermittent lasting 10 minutes and occurring about twice a month.  No associated headache.  MRI of brain and spinal cord showed non-enhancing lesions in the brain and spinal cord.   She has history of migraines in childhood but she grew out of them.  They restarted in late 2023-early 2024.  They are a moderate right periorbital nonthrobbing pain sometimes associated with nausea, photophobia and phonophobia.  Occurs every 6 to 8 weeks.  Lasts 3 days but aborts quickly with sumatriptan  (although it causes head pressure).  May be associated with poor sleep, stress and alcohol.   In June 2019, she developed right lower extremity DVT.  She is on chronic Xarelto as no cause was found.  In early 2020, she started experiencing strange sensation in the right leg below the knee that would make it difficult to bear weight.  No pain or actual numbness although she has prior history of right sciatica.    Past NSAIDS/analgesics:  Excedrin Past abortive  triptans:  none Past abortive ergotamine:  none Past muscle relaxants:  none Past anti-emetic:  none Past antihypertensive medications:  none Past antidepressant medications:  none Past anticonvulsant medications:  none Past anti-CGRP:  none Past vitamins/Herbal/Supplements:  B12, D Past  antihistamines/decongestants:  Sudafed  Labs: 08/2022:  Acute Hep Panel/Hep B surface Ab negative; Quantiferon-TB Gold Plus negative; JCV Ab positive with index 2.36  Images: 02/23/2023 MRI BRAIN W WO:  1. Chronic cerebral white matter demyelinating disease stable since 07/20/2022. No progressed or active demyelination identified. 2. No new intracranial abnormality. Insurance would not approve MRI of cervical and thoracic spine for a year. 07/20/2022 MRI BRAIN W WO:  primarily periventricular T2/FLAIR hyperintensities in the cerebral white matter.   08/16/2022 MRI C & T-SPINE W WO:  non-enhancing hyperintense lesion at the C2 level of spinal cord as well as T2 hyperintense lesion at the T2-T3 level of spinal cord with possible mild contrast enhancement.  PAST MEDICAL HISTORY: Past Medical History:  Diagnosis Date   Acid reflux    Anemia    Anxiety 09/15/21   Back pain    Bilateral bunions    Chest pain    Constipation    DVT (deep venous thrombosis) (HCC)    right leg   Dyspnea    Eczema    Encounter for general adult medical examination with abnormal findings 02/02/2022   Gestational diabetes    HTN (hypertension)    Lactose intolerance    Leg edema    Vitamin D  deficiency     MEDICATIONS: Current Outpatient Medications on File Prior to Visit  Medication Sig Dispense Refill   Efinaconazole  10 % SOLN Apply 1 drop topically daily. 4 mL 11   hydrocortisone  2.5 % ointment Apply topically twice daily as need to red sandpapery rash. 30 g 2   hydrocortisone  2.5 % ointment APPLY TOPICALLY TWICE A DAY 453.6 g 1   rivaroxaban (XARELTO) 20 MG TABS tablet Take 1 tablet by mouth daily.     topiramate (TOPAMAX) 25 MG tablet Take by mouth.     Ubrogepant  (UBRELVY ) 100 MG TABS Take 1 tablet (100 mg total) by mouth as needed. May repeat after 2 hours.  Maximum 2 tablets in 24 hours. 16 tablet 11   Vitamin D , Ergocalciferol , (DRISDOL ) 1.25 MG (50000 UNIT) CAPS capsule Take 1 capsule (50,000  Units total) by mouth every 7 (seven) days. 4 capsule 0   No current facility-administered medications on file prior to visit.    ALLERGIES: Allergies  Allergen Reactions   Dye Fdc Blue 1 [Blue Dye #1 (Brilliant Blue)] Itching and Rash   Nickel Itching and Rash    FAMILY HISTORY: Family History  Problem Relation Age of Onset   Hypertension Mother    Heart disease Mother    Thyroid disease Mother    Obesity Mother    Stroke Maternal Aunt    Stroke Maternal Uncle    Migraines Maternal Uncle       Objective:  *** General: No acute distress.  Patient appears ***-groomed.   Head:  Normocephalic/atraumatic Eyes:  Fundi examined but not visualized Neck: supple, no paraspinal tenderness, full range of motion Heart:  Regular rate and rhythm Neurological Exam: alert and oriented.  Speech fluent and not dysarthric, language intact.  CN II-XII intact. Bulk and tone normal, muscle strength 5/5 throughout.  Sensation to light touch intact.  Deep tendon reflexes 2+ throughout, toes downgoing.  Finger to nose testing intact.  Gait normal, Romberg negative.  Juliene Dunnings, DO  CC: ***

## 2023-09-02 ENCOUNTER — Encounter: Payer: Self-pay | Admitting: Neurology

## 2023-09-02 ENCOUNTER — Ambulatory Visit: Payer: No Typology Code available for payment source | Admitting: Neurology

## 2023-09-03 NOTE — Progress Notes (Unsigned)
 NEUROLOGY FOLLOW UP OFFICE NOTE  Allison Valenzuela [id number]  Assessment/Plan:   Multiple sclerosis, relapsing-remitting. Migraine without aura, without status migrainosus, not intractable Nocturnal leg cramps Leukopenia, unknown etiology but now on Ocrevus , followed by hematology who is also monitoring closely. Elevated liver enzymes, normalized      DMT:  Ocrevus .  She is going to research about switching to Ocrevus  [place]  Vit D 10,000 I U every other day. Check CBC with diff, immunoglobulin panel, vit D today and again in 6 months. Recheck MRI of cervical/thoracic with and without contrast in one year For migraine management: Topiramate at bedtime Ubrelvy  PRN Follow up in 6 months.   Subjective:  Allison Valenzuela is a 43 year old female with HTN and history of right LE DVT on Xarelto who follows up for multiple sclerosis.  MRIs personally reviewed.  UPDATE: DMT:  Ocrevus  (started 9/20 & 10/22/2022.  Last infusion in April) Other:  D 10,000 I U every other day  05/12/2023 MRI BRAIN W WO:  1. Stable chronic demyelinating disease since February MRI. No active or progressive demyelination identified. 2. No new intracranial abnormality. 05/22/2023 MRI C-SPINE W WO:  Chronic demyelinating lesion at the C2 level. No definite new lesion or abnormal enhancement. 05/10/2023 MRI T-SPINE W WO:  1. Chronic thoracic spinal cord demyelination appears stable since 2024-08-27most pronounced at T2-T3, T6, and the conus medullaris levels. No active or progressed demyelinating lesion identified. 2. Moderate chronic dextroconvex midthoracic scoliosis. But no thoracic disc herniation or spinal stenosis.  04/01/2023 LABS:  IgG 1679, IgM 76, IgA 239; vit D 88; hepatic panel with t bili 1.0, ALP 40, AST 18, ALT 13 04/11/2023 LABS:  CBC with WBC 3, HGB 12.8, HCT 38.4, PLT 228, ALC 1  With last infusion in April (the first full dose at one time), she felt more fatigued for a couple of days.     Has facial tingling from time to time, 15 seconds at a time.     Migraines are controlled.  Usually if doesn't get enough sleep.  They occur every other month.        Current NSAIDS/analgesics:  none Current triptans: none Current ergotamine:  none Current anti-emetic:  none Current muscle relaxants:  none Current Antihypertensive medications:  none Current Antidepressant medications:  none Current Anticonvulsant medications:  topiramate 25mg  daily Current anti-CGRP:  Ubrelvy  100mg  Current Vitamins/Herbal/Supplements:  D Current Antihistamines/Decongestants:  none Other therapy:  none Birth control:  none Other medications:  Xarelto   HISTORY: In 09-11-2007, she developed right sided numbness and tingling of face, torso, arm and leg after starting OCP.  She was advised to discontinue it and symptoms resolved.  She never had an MRI of the brain.  She had a recurrence of similar right sided numbness and tingling around February-March 2024, but this time intermittent lasting 10 minutes and occurring about twice a month.  No associated headache.  MRI of brain and spinal cord showed non-enhancing lesions in the brain and spinal cord.   She has history of migraines in childhood but she grew out of them.  They restarted in late 2023-early 2024.  They are a moderate right periorbital nonthrobbing pain sometimes associated with nausea, photophobia and phonophobia.  Occurs every 6 to 8 weeks.  Lasts 3 days but aborts quickly with sumatriptan  (although it causes head pressure).  May be associated with poor sleep, stress and alcohol.   In June 2019, she developed right lower extremity DVT.  She is on chronic Xarelto as no cause was found.  In early 2020, she started experiencing strange sensation in the right leg below the knee that would make it difficult to bear weight.  No pain or actual numbness although she has prior history of right sciatica.    Past NSAIDS/analgesics:  Excedrin Past abortive  triptans:  sumatriptan  tab Past abortive ergotamine:  none Past muscle relaxants:  none Past anti-emetic:  none Past antihypertensive medications:  none Past antidepressant medications:  none Past anticonvulsant medications:  none Past anti-CGRP:  none Past vitamins/Herbal/Supplements:  B12, D Past antihistamines/decongestants:  Sudafed  Labs: 08/2022:  Acute Hep Panel/Hep B surface Ab negative; Quantiferon-TB Gold Plus negative; JCV Ab positive with index 2.36  Images: 02/23/2023 MRI BRAIN W WO:  1. Chronic cerebral white matter demyelinating disease stable since 07/20/2022. No progressed or active demyelination identified. 2. No new intracranial abnormality. Insurance would not approve MRI of cervical and thoracic spine for a year. 07/20/2022 MRI BRAIN W WO:  primarily periventricular T2/FLAIR hyperintensities in the cerebral white matter.   08/16/2022 MRI C & T-SPINE W WO:  non-enhancing hyperintense lesion at the C2 level of spinal cord as well as T2 hyperintense lesion at the T2-T3 level of spinal cord with possible mild contrast enhancement.  PAST MEDICAL HISTORY: Past Medical History:  Diagnosis Date   Acid reflux    Anemia    Anxiety 09/15/21   Back pain    Bilateral bunions    Chest pain    Constipation    DVT (deep venous thrombosis) (HCC)    right leg   Dyspnea    Eczema    Encounter for general adult medical examination with abnormal findings 02/02/2022   Gestational diabetes    HTN (hypertension)    Lactose intolerance    Leg edema    Vitamin D  deficiency     MEDICATIONS: Current Outpatient Medications on File Prior to Visit  Medication Sig Dispense Refill   Efinaconazole  10 % SOLN Apply 1 drop topically daily. 4 mL 11   hydrocortisone  2.5 % ointment Apply topically twice daily as need to red sandpapery rash. 30 g 2   hydrocortisone  2.5 % ointment APPLY TOPICALLY TWICE A DAY 453.6 g 1   rivaroxaban (XARELTO) 20 MG TABS tablet Take 1 tablet by mouth daily.      topiramate (TOPAMAX) 25 MG tablet Take by mouth.     Ubrogepant  (UBRELVY ) 100 MG TABS Take 1 tablet (100 mg total) by mouth as needed. May repeat after 2 hours.  Maximum 2 tablets in 24 hours. 16 tablet 11   Vitamin D , Ergocalciferol , (DRISDOL ) 1.25 MG (50000 UNIT) CAPS capsule Take 1 capsule (50,000 Units total) by mouth every 7 (seven) days. 4 capsule 0   No current facility-administered medications on file prior to visit.    ALLERGIES: Allergies  Allergen Reactions   Dye Fdc Blue 1 [Blue Dye #1 (Brilliant Blue)] Itching and Rash   Nickel Itching and Rash    FAMILY HISTORY: Family History  Problem Relation Age of Onset   Hypertension Mother    Heart disease Mother    Thyroid disease Mother    Obesity Mother    Stroke Maternal Aunt    Stroke Maternal Uncle    Migraines Maternal Uncle       Objective:  Blood pressure 135/84, pulse 63, height 5' 6 (1.676 m), weight 241 lb (109.3 kg), last menstrual period 04/08/2019, SpO2 99%. General: No acute distress.  Patient appears well-groomed.  Head:  Normocephalic/atraumatic Eyes:  Fundi examined but not visualized Neck: supple, no paraspinal tenderness, full range of motion Heart:  Regular rate and rhythm Neurological Exam: alert and oriented.  Speech fluent and not dysarthric, language intact.  CN II-XII intact. Bulk and tone normal, muscle strength 5/5 throughout.  Sensation to light touch intact.  Deep tendon reflexes 2+ throughout, toes downgoing.  Finger to nose testing intact.  Gait normal, Romberg negative.   Juliene Dunnings, DO  CC: Suzzane Blanch, MD

## 2023-09-04 ENCOUNTER — Ambulatory Visit: Admitting: Neurology

## 2023-09-04 ENCOUNTER — Other Ambulatory Visit

## 2023-09-04 VITALS — BP 135/84 | HR 63 | Ht 66.0 in | Wt 241.0 lb

## 2023-09-04 DIAGNOSIS — G35 Multiple sclerosis: Secondary | ICD-10-CM

## 2023-09-04 MED ORDER — UBRELVY 100 MG PO TABS: 1.0000 | ORAL_TABLET | ORAL | 11 refills | Status: AC | PRN

## 2023-09-04 NOTE — Patient Instructions (Signed)
 Ocrevus  D3 10,000 I U every other day Topiramate Ubrelvy  Check labs:  CBC with diff, IgG, vit D

## 2023-09-05 LAB — CBC WITH DIFFERENTIAL/PLATELET
Absolute Lymphocytes: 1041 {cells}/uL (ref 850–3900)
Absolute Monocytes: 264 {cells}/uL (ref 200–950)
Basophils Absolute: 9 {cells}/uL (ref 0–200)
Basophils Relative: 0.3 %
Eosinophils Absolute: 49 {cells}/uL (ref 15–500)
Eosinophils Relative: 1.7 %
HCT: 38.1 % (ref 35.0–45.0)
Hemoglobin: 12.5 g/dL (ref 11.7–15.5)
MCH: 31.6 pg (ref 27.0–33.0)
MCHC: 32.8 g/dL (ref 32.0–36.0)
MCV: 96.5 fL (ref 80.0–100.0)
MPV: 10 fL (ref 7.5–12.5)
Monocytes Relative: 9.1 %
Neutro Abs: 1537 {cells}/uL (ref 1500–7800)
Neutrophils Relative %: 53 %
Platelets: 219 Thousand/uL (ref 140–400)
RBC: 3.95 Million/uL (ref 3.80–5.10)
RDW: 12.1 % (ref 11.0–15.0)
Total Lymphocyte: 35.9 %
WBC: 2.9 Thousand/uL — ABNORMAL LOW (ref 3.8–10.8)

## 2023-09-05 LAB — VITAMIN D 25 HYDROXY (VIT D DEFICIENCY, FRACTURES): Vit D, 25-Hydroxy: 92 ng/mL (ref 30–100)

## 2023-09-05 LAB — IGG: IgG (Immunoglobin G), Serum: 1398 mg/dL (ref 600–1640)

## 2023-09-09 ENCOUNTER — Ambulatory Visit: Payer: Self-pay | Admitting: Neurology

## 2023-09-10 NOTE — Progress Notes (Signed)
 LMOVM for patient to call the office back.

## 2023-10-17 ENCOUNTER — Encounter: Payer: Self-pay | Admitting: Internal Medicine

## 2023-10-17 ENCOUNTER — Other Ambulatory Visit: Payer: Self-pay | Admitting: Internal Medicine

## 2023-10-17 DIAGNOSIS — G35D Multiple sclerosis, unspecified: Secondary | ICD-10-CM

## 2023-10-23 ENCOUNTER — Ambulatory Visit

## 2023-11-05 ENCOUNTER — Telehealth: Payer: Self-pay

## 2023-11-05 ENCOUNTER — Other Ambulatory Visit (HOSPITAL_COMMUNITY): Payer: Self-pay | Admitting: Neurology

## 2023-11-05 NOTE — Telephone Encounter (Addendum)
 Telephone call to patient, She would like to go ahead and restart Ocrevus 

## 2023-11-15 ENCOUNTER — Ambulatory Visit (INDEPENDENT_AMBULATORY_CARE_PROVIDER_SITE_OTHER)

## 2023-11-15 VITALS — BP 124/84 | HR 86 | Temp 97.7°F | Resp 16 | Ht 66.0 in | Wt 240.8 lb

## 2023-11-15 DIAGNOSIS — G35D Multiple sclerosis, unspecified: Secondary | ICD-10-CM

## 2023-11-15 MED ORDER — ACETAMINOPHEN 325 MG PO TABS
650.0000 mg | ORAL_TABLET | Freq: Once | ORAL | Status: AC
  Administered 2023-11-15: 650 mg via ORAL
  Filled 2023-11-15: qty 2

## 2023-11-15 MED ORDER — DIPHENHYDRAMINE HCL 25 MG PO CAPS
50.0000 mg | ORAL_CAPSULE | Freq: Once | ORAL | Status: AC
  Administered 2023-11-15: 50 mg via ORAL
  Filled 2023-11-15: qty 2

## 2023-11-15 MED ORDER — SODIUM CHLORIDE 0.9 % IV SOLN
600.0000 mg | Freq: Once | INTRAVENOUS | Status: AC
  Administered 2023-11-15: 600 mg via INTRAVENOUS
  Filled 2023-11-15: qty 20

## 2023-11-15 MED ORDER — METHYLPREDNISOLONE SODIUM SUCC 125 MG IJ SOLR
125.0000 mg | Freq: Once | INTRAMUSCULAR | Status: AC
  Administered 2023-11-15: 125 mg via INTRAVENOUS
  Filled 2023-11-15: qty 2

## 2023-11-15 NOTE — Progress Notes (Signed)
 Diagnosis: Multiple Sclerosis  Provider:  Mannam, Praveen MD  Procedure: IV Infusion  IV Type: Peripheral, IV Location: L Antecubital  Ocrevus  (Ocrelizumab ), Dose: 600 mg  Infusion Start Time: 1002  Infusion Stop Time: 1343  Post Infusion IV Care: Patient declined observation  Discharge: Condition: Good, Destination: Home . AVS Declined  Performed by:  Rocky FORBES Sar, RN

## 2023-11-27 ENCOUNTER — Other Ambulatory Visit: Payer: Self-pay | Admitting: Internal Medicine

## 2023-11-27 DIAGNOSIS — Z1231 Encounter for screening mammogram for malignant neoplasm of breast: Secondary | ICD-10-CM

## 2023-12-23 ENCOUNTER — Ambulatory Visit
Admission: RE | Admit: 2023-12-23 | Discharge: 2023-12-23 | Disposition: A | Source: Ambulatory Visit | Attending: Internal Medicine

## 2023-12-23 DIAGNOSIS — Z1231 Encounter for screening mammogram for malignant neoplasm of breast: Secondary | ICD-10-CM

## 2023-12-24 ENCOUNTER — Encounter: Payer: Self-pay | Admitting: Neurology

## 2023-12-24 NOTE — Addendum Note (Signed)
 Addended by: DAYNE SHERRY RAMAN on: 12/24/2023 03:23 PM   Modules accepted: Orders

## 2024-01-06 ENCOUNTER — Ambulatory Visit (INDEPENDENT_AMBULATORY_CARE_PROVIDER_SITE_OTHER): Admitting: Podiatry

## 2024-01-06 ENCOUNTER — Encounter: Payer: Self-pay | Admitting: Podiatry

## 2024-01-06 ENCOUNTER — Telehealth: Payer: Self-pay | Admitting: Pharmacy Technician

## 2024-01-06 DIAGNOSIS — L600 Ingrowing nail: Secondary | ICD-10-CM

## 2024-01-06 DIAGNOSIS — B351 Tinea unguium: Secondary | ICD-10-CM

## 2024-01-06 MED ORDER — DOXYCYCLINE HYCLATE 100 MG PO TABS: 100.0000 mg | ORAL_TABLET | Freq: Two times a day (BID) | ORAL | 0 refills | Status: AC

## 2024-01-06 NOTE — Telephone Encounter (Signed)
 Prudent Med:  F/u: received several faxes in regards to Ocrevus . Called and spoke w/ Rep to discuss patient has been enrolled for the co-pay card. Nothing was needed and fax has been scanned to media tab.

## 2024-01-06 NOTE — Patient Instructions (Signed)

## 2024-01-06 NOTE — Progress Notes (Signed)
 Subjective: Chief Complaint  Patient presents with   Ingrown Toenail    Ingrown toenail R hallux .      43 year old female presents the office with above concerns. She said the corner of the nail has become tender and she wants to have the corn removed.  She has had the nail removed previously for fungus but still thickened discolored and does cause discomfort.  No swelling, redness or any drainage that she reports.  Objective: AAO x3, NAD DP/PT pulses palpable bilaterally, CRT less than 3 seconds On the right hallux toenail is mildly hypertrophic, dystrophic with brown discoloration.  Incurvation present medial aspect worse than the lateral aspect but incurvated on both corners.  Tenderness palpation along the medial nail border.  No drainage or pus or signs of infection. No pain with calf compression, swelling, warmth, erythema  Assessment: Ingrown toenail right hallux, onychomycosis  Plan: -All treatment options discussed with the patient including all alternatives, risks, complications.  -At this time, the patient is requesting partial nail removal with chemical matricectomy to the symptomatic portion of the nail. Risks and complications were discussed with the patient for which they understand and written consent was obtained. Under sterile conditions a total of 3 mL of a mixture of 2% lidocaine  plain and 0.5% Marcaine  plain was infiltrated in a hallux block fashion. Once anesthetized, the skin was prepped in sterile fashion. A tourniquet was then applied. Next the medial, lateral aspect of hallux nail border was then sharply excised making sure to remove the entire offending nail border. Once the nails were ensured to be removed area was debrided and the underlying skin was intact. There is no purulence identified in the procedure. Next phenol was then applied under standard conditions and copiously irrigated.  Silvadene was applied. A dry sterile dressing was applied. After application of  the dressing the tourniquet was removed and there is found to be an immediate capillary refill time to the digit. The patient tolerated the procedure well any complications. Post procedure instructions were discussed the patient for which he verbally understood. Discussed signs/symptoms of infection and directed to call the office immediately should any occur or go directly to the emergency room. In the meantime, encouraged to call the office with any questions, concerns, changes symptoms. -Nail sent for culture -Patient encouraged to call the office with any questions, concerns, change in symptoms.   Donnice JONELLE Fees DPM

## 2024-01-20 ENCOUNTER — Ambulatory Visit: Admitting: Podiatry

## 2024-01-21 ENCOUNTER — Ambulatory Visit: Payer: Self-pay | Admitting: Podiatry

## 2024-01-22 ENCOUNTER — Ambulatory Visit: Payer: Self-pay | Admitting: Podiatry

## 2024-02-06 ENCOUNTER — Ambulatory Visit: Admitting: Neurology

## 2024-02-10 ENCOUNTER — Ambulatory Visit: Payer: No Typology Code available for payment source | Admitting: Internal Medicine

## 2024-02-19 ENCOUNTER — Encounter: Payer: Self-pay | Admitting: Neurology

## 2024-02-19 DIAGNOSIS — G35A Relapsing-remitting multiple sclerosis: Secondary | ICD-10-CM

## 2024-03-06 ENCOUNTER — Ambulatory Visit: Admitting: Neurology

## 2024-05-15 ENCOUNTER — Ambulatory Visit

## 2024-07-27 ENCOUNTER — Other Ambulatory Visit

## 2024-08-11 ENCOUNTER — Ambulatory Visit: Admitting: Neurology
